# Patient Record
Sex: Female | Born: 1940 | Race: White | Hispanic: No | Smoking: Never smoker
Health system: Southern US, Community
[De-identification: ages and names within clinical notes are randomized; demographics above are authoritative.]

## PROBLEM LIST (undated history)

## (undated) DIAGNOSIS — C189 Malignant neoplasm of colon, unspecified: Secondary | ICD-10-CM

## (undated) DIAGNOSIS — J329 Chronic sinusitis, unspecified: Secondary | ICD-10-CM

## (undated) DIAGNOSIS — E063 Autoimmune thyroiditis: Secondary | ICD-10-CM

## (undated) DIAGNOSIS — Y92009 Unspecified place in unspecified non-institutional (private) residence as the place of occurrence of the external cause: Secondary | ICD-10-CM

## (undated) DIAGNOSIS — F329 Major depressive disorder, single episode, unspecified: Secondary | ICD-10-CM

## (undated) DIAGNOSIS — E785 Hyperlipidemia, unspecified: Secondary | ICD-10-CM

## (undated) DIAGNOSIS — M8580 Other specified disorders of bone density and structure, unspecified site: Secondary | ICD-10-CM

## (undated) DIAGNOSIS — G8929 Other chronic pain: Secondary | ICD-10-CM

## (undated) DIAGNOSIS — F32A Depression, unspecified: Secondary | ICD-10-CM

## (undated) DIAGNOSIS — M25511 Pain in right shoulder: Secondary | ICD-10-CM

## (undated) DIAGNOSIS — E039 Hypothyroidism, unspecified: Secondary | ICD-10-CM

## (undated) DIAGNOSIS — M1990 Unspecified osteoarthritis, unspecified site: Secondary | ICD-10-CM

## (undated) DIAGNOSIS — W19XXXA Unspecified fall, initial encounter: Secondary | ICD-10-CM

## (undated) DIAGNOSIS — F419 Anxiety disorder, unspecified: Secondary | ICD-10-CM

## (undated) DIAGNOSIS — R609 Edema, unspecified: Secondary | ICD-10-CM

## (undated) HISTORY — DX: Hypothyroidism, unspecified: E03.9

## (undated) HISTORY — DX: Other specified disorders of bone density and structure, unspecified site: M85.80

## (undated) HISTORY — PX: CATARACT EXTRACTION W/ INTRAOCULAR LENS  IMPLANT, BILATERAL: SHX1307

## (undated) HISTORY — DX: Chronic sinusitis, unspecified: J32.9

## (undated) HISTORY — PX: TONSILLECTOMY: SUR1361

## (undated) HISTORY — PX: TUBAL LIGATION: SHX77

## (undated) HISTORY — DX: Hyperlipidemia, unspecified: E78.5

## (undated) HISTORY — DX: Edema, unspecified: R60.9

---

## 2010-11-03 ENCOUNTER — Other Ambulatory Visit: Payer: Self-pay | Admitting: Geriatric Medicine

## 2010-11-03 DIAGNOSIS — Z1231 Encounter for screening mammogram for malignant neoplasm of breast: Secondary | ICD-10-CM

## 2010-11-26 ENCOUNTER — Ambulatory Visit: Payer: Self-pay

## 2011-01-14 ENCOUNTER — Ambulatory Visit
Admission: RE | Admit: 2011-01-14 | Discharge: 2011-01-14 | Disposition: A | Discharge status: Home / Self Care | Payer: BLUE CROSS/BLUE SHIELD | Source: Ambulatory Visit | Attending: Geriatric Medicine | Admitting: Geriatric Medicine

## 2011-01-14 DIAGNOSIS — Z1231 Encounter for screening mammogram for malignant neoplasm of breast: Secondary | ICD-10-CM

## 2011-11-18 ENCOUNTER — Other Ambulatory Visit: Payer: Self-pay | Admitting: Geriatric Medicine

## 2011-11-18 DIAGNOSIS — E039 Hypothyroidism, unspecified: Secondary | ICD-10-CM

## 2011-11-30 ENCOUNTER — Ambulatory Visit
Admission: RE | Admit: 2011-11-30 | Discharge: 2011-11-30 | Disposition: A | Discharge status: Home / Self Care | Payer: Medicare Other | Source: Ambulatory Visit | Attending: Geriatric Medicine | Admitting: Geriatric Medicine

## 2011-11-30 DIAGNOSIS — E039 Hypothyroidism, unspecified: Secondary | ICD-10-CM

## 2011-12-02 ENCOUNTER — Other Ambulatory Visit: Payer: Self-pay | Admitting: Geriatric Medicine

## 2011-12-02 DIAGNOSIS — E041 Nontoxic single thyroid nodule: Secondary | ICD-10-CM

## 2011-12-06 ENCOUNTER — Other Ambulatory Visit: Payer: Self-pay | Admitting: Geriatric Medicine

## 2011-12-06 DIAGNOSIS — E042 Nontoxic multinodular goiter: Secondary | ICD-10-CM

## 2011-12-07 ENCOUNTER — Other Ambulatory Visit (HOSPITAL_COMMUNITY)
Admission: RE | Admit: 2011-12-07 | Discharge: 2011-12-07 | Disposition: A | Discharge status: Home / Self Care | Payer: Medicare Other | Source: Ambulatory Visit | Attending: Interventional Radiology | Admitting: Interventional Radiology

## 2011-12-07 ENCOUNTER — Ambulatory Visit
Admission: RE | Admit: 2011-12-07 | Discharge: 2011-12-07 | Disposition: A | Discharge status: Home / Self Care | Payer: Medicare Other | Source: Ambulatory Visit | Attending: Geriatric Medicine | Admitting: Geriatric Medicine

## 2011-12-07 DIAGNOSIS — E042 Nontoxic multinodular goiter: Secondary | ICD-10-CM

## 2011-12-07 DIAGNOSIS — E041 Nontoxic single thyroid nodule: Secondary | ICD-10-CM

## 2011-12-07 DIAGNOSIS — E049 Nontoxic goiter, unspecified: Secondary | ICD-10-CM | POA: Insufficient documentation

## 2012-04-13 ENCOUNTER — Other Ambulatory Visit: Payer: Self-pay | Admitting: Geriatric Medicine

## 2012-04-13 DIAGNOSIS — R479 Unspecified speech disturbances: Secondary | ICD-10-CM

## 2012-04-13 DIAGNOSIS — R269 Unspecified abnormalities of gait and mobility: Secondary | ICD-10-CM

## 2012-04-18 ENCOUNTER — Ambulatory Visit
Admission: RE | Admit: 2012-04-18 | Discharge: 2012-04-18 | Disposition: A | Discharge status: Home / Self Care | Payer: Medicare Other | Source: Ambulatory Visit | Attending: Geriatric Medicine | Admitting: Geriatric Medicine

## 2012-04-18 DIAGNOSIS — R269 Unspecified abnormalities of gait and mobility: Secondary | ICD-10-CM

## 2012-04-18 DIAGNOSIS — R479 Unspecified speech disturbances: Secondary | ICD-10-CM

## 2012-04-18 MED ORDER — GADOBENATE DIMEGLUMINE 529 MG/ML IV SOLN
13.0000 mL | Freq: Once | INTRAVENOUS | Status: AC | PRN
  Administered 2012-04-18: 13 mL via INTRAVENOUS

## 2012-04-27 ENCOUNTER — Ambulatory Visit: Payer: Medicare Other | Attending: Geriatric Medicine | Admitting: Physical Therapy

## 2012-04-27 DIAGNOSIS — IMO0001 Reserved for inherently not codable concepts without codable children: Secondary | ICD-10-CM | POA: Insufficient documentation

## 2012-04-27 DIAGNOSIS — M6281 Muscle weakness (generalized): Secondary | ICD-10-CM | POA: Insufficient documentation

## 2012-04-27 DIAGNOSIS — R269 Unspecified abnormalities of gait and mobility: Secondary | ICD-10-CM | POA: Insufficient documentation

## 2012-05-11 ENCOUNTER — Ambulatory Visit (INDEPENDENT_AMBULATORY_CARE_PROVIDER_SITE_OTHER): Payer: Medicare Other | Admitting: Neurology

## 2012-05-11 ENCOUNTER — Encounter: Payer: Self-pay | Admitting: Neurology

## 2012-05-11 VITALS — BP 116/78 | HR 76 | Temp 97.7°F | Resp 16 | Wt 136.0 lb

## 2012-05-11 DIAGNOSIS — R531 Weakness: Secondary | ICD-10-CM | POA: Insufficient documentation

## 2012-05-11 DIAGNOSIS — R5381 Other malaise: Secondary | ICD-10-CM

## 2012-05-11 DIAGNOSIS — R269 Unspecified abnormalities of gait and mobility: Secondary | ICD-10-CM

## 2012-05-11 NOTE — Progress Notes (Signed)
Mackenzie Blanchard was seen today in the movement disorders clinic for neurologic consultation at the request of Ginette Otto, MD.  This patient is accompanied in the office by her daughter, who supplements the history.     The consultation is for the evaluation of abnormal gait and speech.  She states that it started 4 years ago.  She states that she got up one morning and felt very weak in the legs bilaterally.  She went to multiple drs in [place] without identified etiology.  She moved to [place] about 3 years ago to be near her daughter.  Her daughter noted that the weakness seemed stable over the last 3 years, but then in August 2013 something new happened.  She was in a department store when she got diffusely weak and had CP.  She went to the cardiologist and had a full cardiac w/u that was negative.  She started to have a "jerkiness" of speech that began last week.  Intermittently, her right foot will drag.   Her daughter notes that sx's are worse with stress and her daughter thinks that she sounds like she is hyperventilating.  The patient states that she does not feel like she is hyperventilating but that it is "hard" to get out her speech.  Specific Symptoms:  Tremor: intermittently in the head, but describes it as a "twitch" as opposed to a rhythmic tremor Voice: ? Softer last week but better this week Sleep: poor sleeper since divorce years ago, trazadone helps  Vivid Dreams:  no  Acting out dreams:  no Wet Pillows: no Postural symptoms:  Trouble with balance for 4 years  Falls?  Feb 2013 was last fall  Loss of smell:  no Loss of taste:  yes Urinary Incontinence:  Rare and if so due to urgency Difficulty Swallowing:  no Handwriting, micrographia: no micrographia but trouble with making handwriting "flow" Trouble with ADL's:  Is very slow and takes longer to do ADL's but does own.  Trouble buttoning clothing:  no Depression:  Denies, stating that her symptoms "worry" her but doesn't think  depressed Memory changes:  "slower to think" but able to do well. Diplopia:  no  The patient went to PT 3 weeks ago, gave her a walker and recommended that she see a neurologist.  Does not exercise.  Neuroimaging has  previously been performed.  It is available for my review today.  Her MRI was done on 04/13/12 and it was normal.  PREVIOUS MEDICATIONS: none to date for this  ALLERGIES:   Allergies  Allergen Reactions  . Codeine   . Penicillins     CURRENT MEDICATIONS:  Current Outpatient Prescriptions on File Prior to Visit  Medication Sig Dispense Refill  . diphenhydrAMINE (SOMINEX) 25 MG tablet Take 25 mg by mouth at bedtime as needed.      . hydrochlorothiazide (HYDRODIURIL) 25 MG tablet Take 25 mg by mouth daily.      Marland Kitchen levothyroxine (SYNTHROID, LEVOTHROID) 75 MCG tablet Take 75 mcg by mouth daily.      . Potassium 75 MG TABS Take by mouth. Taking around 2000mg  daily      . traZODone (DESYREL) 50 MG tablet Take 50 mg by mouth at bedtime.        PAST MEDICAL HISTORY:   Past Medical History  Diagnosis Date  . Hypothyroidism   . Edema     idiopathic  . Sinusitis, chronic     PAST SURGICAL HISTORY:   Past Surgical History  Procedure  Date  . Cataract extraction     bilaterally    SOCIAL HISTORY:   History   Social History  . Marital Status: Single    Spouse Name: N/A    Number of Children: N/A  . Years of Education: N/A   Occupational History  . Not on file.   Social History Main Topics  . Smoking status: Never Smoker   . Smokeless tobacco: Never Used  . Alcohol Use: No  . Drug Use: No  . Sexually Active: Not on file   Other Topics Concern  . Not on file   Social History Narrative  . No narrative on file    FAMILY HISTORY:   Family Status  Relation Status Death Age  . Mother Deceased     CA, lung  . Father Deceased     MI, CVA  . Brother Deceased     CA, biliary duct  . Child Alive     4, alive and well    ROS:  Denies SOB.    Some CP  but eval negative for cardiac etiology.  Has stopped all activites since august because "cannot do anything."  A complete 10 system review of systems was obtained and was unremarkable apart from what is mentioned above.  PHYSICAL EXAMINATION:    VITALS:   Filed Vitals:   05/11/12 0823  BP: 116/78  Pulse: 76  Temp: 97.7 F (36.5 C)  Resp: 16  Weight: 136 lb (61.689 kg)    GEN:  The patient appears stated age and is in NAD. HEENT:  Normocephalic, atraumatic.  The mucous membranes are moist. The superficial temporal arteries are without ropiness or tenderness. CV:  RRR Lungs:  CTAB Neck/HEME:  There are no carotid bruits bilaterally. Exts:  There is no LE edema bilaterally.   Neurological examination:  Orientation: The patient is alert and oriented x3. Fund of knowledge is appropriate.  Recent and remote memory are intact.  Attention and concentration are normal.    Able to name objects and repeat phrases. Cranial nerves: There is good facial symmetry. Pupils are equal round and reactive to light bilaterally. There are no squar wave jerks.  Fundoscopic exam reveals clear margins bilaterally. Extraocular muscles are intact. The visual fields are full to confrontational testing. The speech is variable, inconsistent and somewhat nonphysiologic.  At times she is quite fluent and clear with normal speech pattern, and that will abruptly change to a jerking, stuttering speech. Soft palate rises symmetrically and there is no tongue deviation.  There are no tongue fasciculations.  Hearing is intact to conversational tone. Sensation: Sensation is intact to light and pinprick throughout (facial, trunk, extremities). Vibration is intact at the bilateral big toe. There is no extinction with double simultaneous stimulation. There is no sensory dermatomal level identified. Motor: With encouragement, strength is 5/5 in the bilateral upper and lower extremities.  There is give way weakness. Shoulder shrug is  equal and symmetric.  There is no pronator drift.  There are no fasciculation anywhere:  Arms, legs, tongue, trunk. Deep tendon reflexes: Deep tendon reflexes are 2/4 at the bilateral biceps, triceps, brachioradialis, and achilles. Plantar responses are downgoing bilaterally.  Movement examination: Tone: There is normal tone in the bilateral upper extremities.  The tone in the lower extremities is normal.  Abnormal movements: Upon standing only (with exertion), the patient develops strange movements in the head, neck and arm that quickly abate with distraction. Coordination:  There is no decremation with RAM's, but she  is slow to perform tasks. Gait and Station: The patient is able to arise out of a deep-seated chair without the use of the hands. The patients gait has an astasia abasia quality and is variable.  When first attempting ambulation, she would kick the legs out in front of her, much like a soldier would walk.  She was then asked to walk without looking at the feet, and the ambulation became more physiologic but she groaned in pain.  Stride length was normal.  Labs: I received a copy of lab work from her primary care office dated 03/13/2012.  Her sodium was 133, potassium 4.0, AST 20, ALT 17, blood cells 6.5, hemoglobin 13.2, hematocrit 40.4 and platelets 265.  ASSESSMENT:   1.  Gait disorder and speech change.  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  I see no evidence of a neurodegenerative movement d/o.  Her brain MRI was normal.   PLAN:   1.  I discussed with the patient and her daughter that I thought we should do an EMG just to be safe and make sure that there is not a peripheral, neuromuscular problem that I have not identified. She is to call me afterwards for the results. 2.  I suggested physical and speech therapy, but the patient felt confident that these modalities would not help.  These have been shown to help in cases such as hers. 3.  I talked to her  about the role that stress could play.  I would like to see her evaluated by psychology.  She did not want [place] to make this referral but stated that she would think about it.  Her daughter was in agreement with the referral but this is obviously the patient's decision. 4.  I don't need to see the patient back in the movement d/o clinic as I feel confident that this is not a movement disorder problem or a cerebellar ataxia.  However, I do want him to call me for the results of this EMG. 5.  Time in the room with patient and daughter was 85 min.

## 2012-05-11 NOTE — Patient Instructions (Addendum)
Your appointment for the nerve conduction studies/electromyelogram is scheduled for Monday, October 21 at 9:45am. at [facility] [address] [place], [place] [phone].  Please arrive 15 minutes early.

## 2012-05-16 ENCOUNTER — Telehealth: Payer: Self-pay | Admitting: Neurology

## 2012-05-16 NOTE — Telephone Encounter (Signed)
Pt would like to be referred to [facility] for her tests instead of [facility]. She is going to cancel her [facility] appointment that is scheduled on 05/26/2012. Can we refer her to [facility] instead? Please advise pt once this has been done.

## 2012-05-17 NOTE — Telephone Encounter (Signed)
Received a call from the patient. She states she has cancelled the NCV/EMG scheduled for later this month in [facility]. She is requesting a referral to a doctor at [facility] that is capable of treating her neurological problems as well as do the testing. We discussed the testing and the referral process and she tells me she will just have her PCP make the referral for her tomorrow when she goes. I told her to let us know if we could help in any way. (see the note below from the patient's daughter.) She states she will.

## 2012-05-17 NOTE — Telephone Encounter (Signed)
Received a call from the patient's daughter, Mackenzie Blanchard. She reports that she does not feel the referral to [facility] for the nerve test is warranted and states that she would not take her for it anyway. She also reports that her mom is seeing Dr. Pete Glatter tomorrow and he also agrees that a referral to [facility] is not needed at this time. [name] states that if her mom calls me back to discuss this that I should let her know tactfully that going to [facility] is not an option at this point. She also asked that I not tell her mother that we talked. We agreed on this plan.

## 2012-05-17 NOTE — Telephone Encounter (Signed)
Left a message for the patient to return my call.  

## 2012-10-20 ENCOUNTER — Other Ambulatory Visit: Payer: Self-pay

## 2012-10-20 DIAGNOSIS — Z1231 Encounter for screening mammogram for malignant neoplasm of breast: Secondary | ICD-10-CM

## 2012-11-01 ENCOUNTER — Other Ambulatory Visit: Payer: Self-pay | Admitting: Gastroenterology

## 2012-11-02 ENCOUNTER — Encounter (HOSPITAL_COMMUNITY): Payer: Self-pay | Admitting: *Deleted

## 2012-11-16 ENCOUNTER — Ambulatory Visit (HOSPITAL_COMMUNITY): Payer: Medicare Other | Admitting: *Deleted

## 2012-11-16 ENCOUNTER — Encounter (HOSPITAL_COMMUNITY)
Admission: RE | Disposition: A | Discharge status: Home / Self Care | Payer: Self-pay | Source: Ambulatory Visit | Attending: Gastroenterology

## 2012-11-16 ENCOUNTER — Encounter (HOSPITAL_COMMUNITY): Payer: Self-pay | Admitting: *Deleted

## 2012-11-16 ENCOUNTER — Ambulatory Visit (HOSPITAL_COMMUNITY)
Admission: RE | Admit: 2012-11-16 | Discharge: 2012-11-16 | Disposition: A | Discharge status: Home / Self Care | Payer: Medicare Other | Source: Ambulatory Visit | Attending: Gastroenterology | Admitting: Gastroenterology

## 2012-11-16 DIAGNOSIS — E042 Nontoxic multinodular goiter: Secondary | ICD-10-CM | POA: Insufficient documentation

## 2012-11-16 DIAGNOSIS — J309 Allergic rhinitis, unspecified: Secondary | ICD-10-CM | POA: Insufficient documentation

## 2012-11-16 DIAGNOSIS — Z885 Allergy status to narcotic agent status: Secondary | ICD-10-CM | POA: Insufficient documentation

## 2012-11-16 DIAGNOSIS — D126 Benign neoplasm of colon, unspecified: Secondary | ICD-10-CM | POA: Insufficient documentation

## 2012-11-16 DIAGNOSIS — Z880 Allergy status to penicillin: Secondary | ICD-10-CM | POA: Insufficient documentation

## 2012-11-16 DIAGNOSIS — I872 Venous insufficiency (chronic) (peripheral): Secondary | ICD-10-CM | POA: Insufficient documentation

## 2012-11-16 DIAGNOSIS — Z1211 Encounter for screening for malignant neoplasm of colon: Secondary | ICD-10-CM | POA: Insufficient documentation

## 2012-11-16 DIAGNOSIS — K5730 Diverticulosis of large intestine without perforation or abscess without bleeding: Secondary | ICD-10-CM | POA: Insufficient documentation

## 2012-11-16 DIAGNOSIS — M899 Disorder of bone, unspecified: Secondary | ICD-10-CM | POA: Insufficient documentation

## 2012-11-16 DIAGNOSIS — Z86718 Personal history of other venous thrombosis and embolism: Secondary | ICD-10-CM | POA: Insufficient documentation

## 2012-11-16 DIAGNOSIS — E7800 Pure hypercholesterolemia, unspecified: Secondary | ICD-10-CM | POA: Insufficient documentation

## 2012-11-16 DIAGNOSIS — E039 Hypothyroidism, unspecified: Secondary | ICD-10-CM | POA: Insufficient documentation

## 2012-11-16 HISTORY — PX: COLONOSCOPY WITH PROPOFOL: SHX5780

## 2012-11-16 LAB — POCT I-STAT EG7
Calcium, Ion: 1.31 mmol/L — ABNORMAL HIGH (ref 1.13–1.30)
HCT: 42 % (ref 36.0–46.0)
Hemoglobin: 14.3 g/dL (ref 12.0–15.0)
O2 Saturation: 27 %
pCO2, Ven: 56.5 mmHg — ABNORMAL HIGH (ref 45.0–50.0)
pH, Ven: 7.334 — ABNORMAL HIGH (ref 7.250–7.300)
pO2, Ven: 20 mmHg — CL (ref 30.0–45.0)

## 2012-11-16 SURGERY — COLONOSCOPY WITH PROPOFOL
Anesthesia: Monitor Anesthesia Care

## 2012-11-16 MED ORDER — KETAMINE HCL 10 MG/ML IJ SOLN
INTRAMUSCULAR | Status: DC | PRN
  Administered 2012-11-16: 10 mg via INTRAVENOUS

## 2012-11-16 MED ORDER — FENTANYL CITRATE 0.05 MG/ML IJ SOLN
INTRAMUSCULAR | Status: DC | PRN
  Administered 2012-11-16: 100 ug via INTRAVENOUS

## 2012-11-16 MED ORDER — PROPOFOL 10 MG/ML IV EMUL
INTRAVENOUS | Status: DC | PRN
  Administered 2012-11-16: 100 ug/kg/min via INTRAVENOUS

## 2012-11-16 MED ORDER — DEXTROSE 5 % IV SOLN
INTRAVENOUS | Status: DC
  Administered 2012-11-16: 500 mL via INTRAVENOUS

## 2012-11-16 MED ORDER — SODIUM CHLORIDE 0.9 % IV SOLN: INTRAVENOUS | Status: DC

## 2012-11-16 MED ORDER — LIDOCAINE HCL 1 % IJ SOLN
INTRAMUSCULAR | Status: DC | PRN
  Administered 2012-11-16: 50 mg via INTRADERMAL

## 2012-11-16 MED ORDER — LACTATED RINGERS IV SOLN: INTRAVENOUS | Status: DC

## 2012-11-16 MED ORDER — MIDAZOLAM HCL 5 MG/5ML IJ SOLN
INTRAMUSCULAR | Status: DC | PRN
  Administered 2012-11-16: 2 mg via INTRAVENOUS

## 2012-11-16 SURGICAL SUPPLY — 21 items

## 2012-11-16 NOTE — Op Note (Signed)
Procedure: Screening colonoscopy  Endoscopist: Danise Edge  Premedication: Propofol administered by anesthesia  Procedure: The patient was placed in the left lateral decubitus position. Anal inspection and digital rectal exam were normal. The Pentax pediatric colonoscope was introduced into the rectum and advanced to the cecum. A normal-appearing appendiceal orifice and ileocecal valve were identified. Colonic preparation for the exam today was good.  Rectum. Normal. Retroflex view of the distal rectum normal.  Sigmoid colon and descending colon. Left colonic diverticulosis.  Splenic flexure. Normal.  Transverse colon. Normal.  Hepatic flexure. Normal.  Descending colon. Normal.  Cecum and ileocecal valve. In the proximal cecum adjacent to the appendiceal orifice there was a 20 mm carpet-like villous appearing  sessile polyp which was biopsied. The polyp was not removed.  Assessment:  #1. There is a 20 mm carpet-like villous-appearing sessile polyp in the proximal cecum which was biopsied but not removed.  #2. Left colonic diverticulosis  Recommendations: If cecal polyp returns neoplastic but noncancerous pathologically, I will refer her to Dr. Georges Mouse at [facility] for endoscopic mucosal resection of the proximal cecal polyp.

## 2012-11-16 NOTE — Preoperative (Signed)
Beta Blockers   Reason not to administer Beta Blockers:Not Applicable 

## 2012-11-16 NOTE — H&P (Signed)
Procedure: Screening colonoscopy  History: The patient is a 72 year old female born 1940/11/03. The patient is scheduled to undergo her first screening colonoscopy with polypectomy to prevent colon cancer.  Chronic medications: Vitamins test. Potassium. Multivitamin. Magnesium. Vitamin C. Vitamin D. Lycopene. Melatonin. Benadryl. Aspirin. Give furosemide. Synthroid. Hydrochlorothiazide. Trazodone.  Past medical and surgical history: Hypercholesterolemia. Venous insufficiency. Allergic rhinitis. 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 Hypothyroidism. Osteopenia. Multinodular goiter. Tubal ligation. Bilateral cataract surgery.  Medication allergies: Penicillin and codeine  Exam: The patient is alert and lying comfortably on the endoscopy stretcher. Sclera are nonicteric. Mouth and throat appear normal. Cardiac exam reveals a regular rhythm. Lungs are clear to auscultation.  Plan: Proceed with baseline screening colonoscopy with polypectomy to prevent colon cancer.

## 2012-11-16 NOTE — Transfer of Care (Signed)
Immediate Anesthesia Transfer of Care Note  Patient: Mackenzie Blanchard  Procedure(s) Performed: Procedure(s) with comments: COLONOSCOPY WITH PROPOFOL (N/A) - h&p in file-hope  Patient Location: PACU and Endoscopy Unit  Anesthesia Type:MAC  Level of Consciousness: oriented and patient cooperative  Airway & Oxygen Therapy: Patient Spontanous Breathing and Patient connected to face mask oxygen  Post-op Assessment: Report given to PACU RN, Post -op Vital signs reviewed and stable and Patient moving all extremities  Post vital signs: Reviewed and stable  Complications: No apparent anesthesia complications

## 2012-11-16 NOTE — Anesthesia Preprocedure Evaluation (Addendum)
Anesthesia Evaluation  Patient identified by MRN, date of birth, ID band Patient awake    Reviewed: Allergy & Precautions, H&P , NPO status , Patient's Chart, lab work & pertinent test results  Airway Mallampati: II TM Distance: >3 FB Neck ROM: Full    Dental  (+) Teeth Intact and Dental Advisory Given   Pulmonary neg pulmonary ROS,  breath sounds clear to auscultation  Pulmonary exam normal       Cardiovascular negative cardio ROS  Rhythm:Regular Rate:Normal  Idiopathic edema; Rx with HCTZ   Neuro/Psych negative neurological ROS  negative psych ROS   GI/Hepatic negative GI ROS, Neg liver ROS,   Endo/Other  Hypothyroidism   Renal/GU negative Renal ROS  negative genitourinary   Musculoskeletal negative musculoskeletal ROS (+)   Abdominal   Peds  Hematology negative hematology ROS (+)   Anesthesia Other Findings   Reproductive/Obstetrics                         Anesthesia Physical Anesthesia Plan  ASA: II  Anesthesia Plan: MAC   Post-op Pain Management:    Induction: Intravenous  Airway Management Planned: Simple Face Mask  Additional Equipment:   Intra-op Plan:   Post-operative Plan:   Informed Consent: I have reviewed the patients History and Physical, chart, labs and discussed the procedure including the risks, benefits and alternatives for the proposed anesthesia with the patient or authorized representative who has indicated his/her understanding and acceptance.   Dental advisory given  Plan Discussed with: CRNA  Anesthesia Plan Comments: (Spoke with patient at length regarding her concerns surrounding idiopathic edema. She is very concerned about receiving any form of balanced salt solution (normal saline, ringers lactate) during the course of this procedure. She insists that we use "water" only. She has consented to the use of D5W,  which we will administer at a reduced  volume. )       Anesthesia Quick Evaluation

## 2012-11-17 ENCOUNTER — Encounter (HOSPITAL_COMMUNITY): Payer: Self-pay | Admitting: Gastroenterology

## 2012-11-27 NOTE — Anesthesia Postprocedure Evaluation (Signed)
Anesthesia Post Note  Patient: Mackenzie Blanchard  Procedure(s) Performed: Procedure(s) (LRB): COLONOSCOPY WITH PROPOFOL (N/A)  Anesthesia type: MAC  Patient location: PACU  Post pain: Pain level controlled  Post assessment: Post-op Vital signs reviewed  Last Vitals:  Filed Vitals:   11/16/12 1458  BP: 126/75  Pulse:   Temp:   Resp: 15    Post vital signs: Reviewed  Level of consciousness: sedated  Complications: No apparent anesthesia complications

## 2012-12-06 ENCOUNTER — Ambulatory Visit: Payer: Medicare Other

## 2012-12-07 ENCOUNTER — Ambulatory Visit: Payer: Medicare Other

## 2012-12-21 ENCOUNTER — Ambulatory Visit
Admission: RE | Admit: 2012-12-21 | Discharge: 2012-12-21 | Disposition: A | Discharge status: Home / Self Care | Payer: Medicare Other | Source: Ambulatory Visit

## 2012-12-21 DIAGNOSIS — Z1231 Encounter for screening mammogram for malignant neoplasm of breast: Secondary | ICD-10-CM

## 2013-01-18 ENCOUNTER — Other Ambulatory Visit: Payer: Self-pay | Admitting: Geriatric Medicine

## 2013-01-18 DIAGNOSIS — E049 Nontoxic goiter, unspecified: Secondary | ICD-10-CM

## 2013-01-19 ENCOUNTER — Ambulatory Visit
Admission: RE | Admit: 2013-01-19 | Discharge: 2013-01-19 | Disposition: A | Discharge status: Home / Self Care | Payer: Medicare Other | Source: Ambulatory Visit | Attending: Geriatric Medicine | Admitting: Geriatric Medicine

## 2013-01-19 DIAGNOSIS — E049 Nontoxic goiter, unspecified: Secondary | ICD-10-CM

## 2013-05-02 HISTORY — PX: COLECTOMY: SHX59

## 2014-01-23 ENCOUNTER — Other Ambulatory Visit: Payer: Self-pay | Admitting: Geriatric Medicine

## 2014-01-23 DIAGNOSIS — E031 Congenital hypothyroidism without goiter: Secondary | ICD-10-CM

## 2014-01-28 ENCOUNTER — Ambulatory Visit
Admission: RE | Admit: 2014-01-28 | Discharge: 2014-01-28 | Disposition: A | Discharge status: Home / Self Care | Payer: Medicare Other | Source: Ambulatory Visit | Attending: Geriatric Medicine | Admitting: Geriatric Medicine

## 2014-01-28 DIAGNOSIS — E031 Congenital hypothyroidism without goiter: Secondary | ICD-10-CM

## 2014-04-17 ENCOUNTER — Other Ambulatory Visit: Payer: Self-pay

## 2014-04-17 DIAGNOSIS — Z1231 Encounter for screening mammogram for malignant neoplasm of breast: Secondary | ICD-10-CM

## 2014-05-09 ENCOUNTER — Ambulatory Visit
Admission: RE | Admit: 2014-05-09 | Discharge: 2014-05-09 | Disposition: A | Discharge status: Home / Self Care | Payer: Medicare Other | Source: Ambulatory Visit

## 2014-05-09 DIAGNOSIS — Z1231 Encounter for screening mammogram for malignant neoplasm of breast: Secondary | ICD-10-CM

## 2015-03-17 ENCOUNTER — Ambulatory Visit
Admission: RE | Admit: 2015-03-17 | Discharge: 2015-03-17 | Disposition: A | Discharge status: Home / Self Care | Payer: Medicare Other | Source: Ambulatory Visit | Attending: Nurse Practitioner | Admitting: Nurse Practitioner

## 2015-03-17 ENCOUNTER — Other Ambulatory Visit: Payer: Self-pay | Admitting: Nurse Practitioner

## 2015-03-17 DIAGNOSIS — M542 Cervicalgia: Secondary | ICD-10-CM

## 2015-03-22 ENCOUNTER — Encounter (HOSPITAL_COMMUNITY): Payer: Self-pay | Admitting: Emergency Medicine

## 2015-03-22 ENCOUNTER — Emergency Department (HOSPITAL_COMMUNITY): Payer: Medicare Other

## 2015-03-22 ENCOUNTER — Inpatient Hospital Stay (HOSPITAL_COMMUNITY)
Admission: EM | Admit: 2015-03-22 | Discharge: 2015-03-25 | DRG: 689 | Disposition: A | Discharge status: Home Health | Payer: Medicare Other | Attending: Internal Medicine | Admitting: Internal Medicine

## 2015-03-22 DIAGNOSIS — Z859 Personal history of malignant neoplasm, unspecified: Secondary | ICD-10-CM

## 2015-03-22 DIAGNOSIS — G9340 Encephalopathy, unspecified: Secondary | ICD-10-CM | POA: Diagnosis present

## 2015-03-22 DIAGNOSIS — N390 Urinary tract infection, site not specified: Secondary | ICD-10-CM | POA: Diagnosis present

## 2015-03-22 DIAGNOSIS — E861 Hypovolemia: Secondary | ICD-10-CM | POA: Diagnosis present

## 2015-03-22 DIAGNOSIS — G4700 Insomnia, unspecified: Secondary | ICD-10-CM | POA: Diagnosis present

## 2015-03-22 DIAGNOSIS — M542 Cervicalgia: Secondary | ICD-10-CM | POA: Diagnosis present

## 2015-03-22 DIAGNOSIS — E039 Hypothyroidism, unspecified: Secondary | ICD-10-CM | POA: Diagnosis present

## 2015-03-22 DIAGNOSIS — R531 Weakness: Secondary | ICD-10-CM | POA: Diagnosis present

## 2015-03-22 DIAGNOSIS — E785 Hyperlipidemia, unspecified: Secondary | ICD-10-CM | POA: Diagnosis present

## 2015-03-22 DIAGNOSIS — Z79891 Long term (current) use of opiate analgesic: Secondary | ICD-10-CM

## 2015-03-22 DIAGNOSIS — M8580 Other specified disorders of bone density and structure, unspecified site: Secondary | ICD-10-CM | POA: Diagnosis present

## 2015-03-22 DIAGNOSIS — Z880 Allergy status to penicillin: Secondary | ICD-10-CM

## 2015-03-22 DIAGNOSIS — M62838 Other muscle spasm: Secondary | ICD-10-CM | POA: Diagnosis present

## 2015-03-22 DIAGNOSIS — Z7982 Long term (current) use of aspirin: Secondary | ICD-10-CM

## 2015-03-22 DIAGNOSIS — Z79899 Other long term (current) drug therapy: Secondary | ICD-10-CM | POA: Diagnosis not present

## 2015-03-22 DIAGNOSIS — F419 Anxiety disorder, unspecified: Secondary | ICD-10-CM

## 2015-03-22 DIAGNOSIS — M6248 Contracture of muscle, other site: Secondary | ICD-10-CM | POA: Diagnosis not present

## 2015-03-22 DIAGNOSIS — Z885 Allergy status to narcotic agent status: Secondary | ICD-10-CM

## 2015-03-22 DIAGNOSIS — R627 Adult failure to thrive: Secondary | ICD-10-CM | POA: Diagnosis present

## 2015-03-22 DIAGNOSIS — E871 Hypo-osmolality and hyponatremia: Secondary | ICD-10-CM | POA: Diagnosis present

## 2015-03-22 DIAGNOSIS — M25519 Pain in unspecified shoulder: Secondary | ICD-10-CM | POA: Diagnosis present

## 2015-03-22 LAB — TSH: TSH: 4.521 u[IU]/mL — AB (ref 0.350–4.500)

## 2015-03-22 LAB — CBC
HCT: 44 % (ref 36.0–46.0)
Hemoglobin: 15.1 g/dL — ABNORMAL HIGH (ref 12.0–15.0)
MCH: 31.2 pg (ref 26.0–34.0)
MCHC: 34.3 g/dL (ref 30.0–36.0)
MCV: 90.9 fL (ref 78.0–100.0)
Platelets: 268 10*3/uL (ref 150–400)
RBC: 4.84 MIL/uL (ref 3.87–5.11)
RDW: 12.6 % (ref 11.5–15.5)
WBC: 5.6 10*3/uL (ref 4.0–10.5)

## 2015-03-22 LAB — URINALYSIS, ROUTINE W REFLEX MICROSCOPIC
BILIRUBIN URINE: NEGATIVE
GLUCOSE, UA: NEGATIVE mg/dL
Ketones, ur: NEGATIVE mg/dL
Nitrite: POSITIVE — AB
PROTEIN: NEGATIVE mg/dL
Specific Gravity, Urine: 1.008 (ref 1.005–1.030)
UROBILINOGEN UA: 0.2 mg/dL (ref 0.0–1.0)
pH: 7 (ref 5.0–8.0)

## 2015-03-22 LAB — BASIC METABOLIC PANEL
Anion gap: 10 (ref 5–15)
BUN: 15 mg/dL (ref 6–20)
CALCIUM: 9.6 mg/dL (ref 8.9–10.3)
CO2: 27 mmol/L (ref 22–32)
CREATININE: 0.83 mg/dL (ref 0.44–1.00)
Chloride: 96 mmol/L — ABNORMAL LOW (ref 101–111)
GLUCOSE: 113 mg/dL — AB (ref 65–99)
POTASSIUM: 4 mmol/L (ref 3.5–5.1)
SODIUM: 133 mmol/L — AB (ref 135–145)

## 2015-03-22 LAB — URINE MICROSCOPIC-ADD ON

## 2015-03-22 MED ORDER — HEPARIN SODIUM (PORCINE) 5000 UNIT/ML IJ SOLN
5000.0000 [IU] | Freq: Three times a day (TID) | INTRAMUSCULAR | Status: DC
  Administered 2015-03-22 – 2015-03-25 (×8): 5000 [IU] via SUBCUTANEOUS
  Filled 2015-03-22 (×8): qty 1

## 2015-03-22 MED ORDER — DEXTROSE 5 % IV SOLN
1.0000 g | Freq: Once | INTRAVENOUS | Status: AC
  Administered 2015-03-22: 1 g via INTRAVENOUS
  Filled 2015-03-22: qty 10

## 2015-03-22 MED ORDER — DEXAMETHASONE SODIUM PHOSPHATE 10 MG/ML IJ SOLN
10.0000 mg | Freq: Once | INTRAMUSCULAR | Status: AC
  Administered 2015-03-22: 10 mg via INTRAVENOUS
  Filled 2015-03-22: qty 1

## 2015-03-22 MED ORDER — POLYETHYLENE GLYCOL 3350 17 G PO PACK: 17.0000 g | PACK | Freq: Every day | ORAL | Status: DC | PRN

## 2015-03-22 MED ORDER — DEXTROSE 5 % IV SOLN
1.0000 g | INTRAVENOUS | Status: DC
  Administered 2015-03-23 – 2015-03-24 (×2): 1 g via INTRAVENOUS
  Filled 2015-03-22 (×3): qty 10

## 2015-03-22 MED ORDER — TRAZODONE HCL 50 MG PO TABS
50.0000 mg | ORAL_TABLET | Freq: Every day | ORAL | Status: DC
  Administered 2015-03-23 – 2015-03-24 (×2): 50 mg via ORAL
  Filled 2015-03-22 (×2): qty 1

## 2015-03-22 MED ORDER — ASPIRIN EC 81 MG PO TBEC
81.0000 mg | DELAYED_RELEASE_TABLET | Freq: Every day | ORAL | Status: DC
Start: 2015-03-22 — End: 2015-03-25
  Administered 2015-03-23 – 2015-03-25 (×3): 81 mg via ORAL
  Filled 2015-03-22 (×3): qty 1

## 2015-03-22 MED ORDER — PROCHLORPERAZINE EDISYLATE 5 MG/ML IJ SOLN
10.0000 mg | Freq: Once | INTRAMUSCULAR | Status: AC
  Administered 2015-03-22: 10 mg via INTRAVENOUS
  Filled 2015-03-22: qty 2

## 2015-03-22 MED ORDER — SODIUM CHLORIDE 0.9 % IV BOLUS (SEPSIS)
1000.0000 mL | Freq: Once | INTRAVENOUS | Status: AC
  Administered 2015-03-22: 1000 mL via INTRAVENOUS

## 2015-03-22 MED ORDER — LORAZEPAM 1 MG PO TABS
0.5000 mg | ORAL_TABLET | Freq: Once | ORAL | Status: AC
  Administered 2015-03-22: 0.5 mg via ORAL
  Filled 2015-03-22: qty 1

## 2015-03-22 MED ORDER — ACETAMINOPHEN 325 MG PO TABS
650.0000 mg | ORAL_TABLET | Freq: Four times a day (QID) | ORAL | Status: DC | PRN
  Administered 2015-03-23: 650 mg via ORAL
  Filled 2015-03-22: qty 2

## 2015-03-22 MED ORDER — DIPHENHYDRAMINE HCL 50 MG/ML IJ SOLN
12.5000 mg | Freq: Once | INTRAMUSCULAR | Status: AC
  Administered 2015-03-22: 12.5 mg via INTRAVENOUS
  Filled 2015-03-22: qty 1

## 2015-03-22 MED ORDER — ASPIRIN 81 MG PO TABS: 81.0000 mg | ORAL_TABLET | Freq: Every day | ORAL | Status: DC

## 2015-03-22 MED ORDER — HYDROMORPHONE HCL 2 MG PO TABS: 2.0000 mg | ORAL_TABLET | ORAL | Status: DC | PRN

## 2015-03-22 MED ORDER — ACETAMINOPHEN 325 MG PO TABS
650.0000 mg | ORAL_TABLET | Freq: Once | ORAL | Status: AC
  Administered 2015-03-22: 650 mg via ORAL
  Filled 2015-03-22: qty 2

## 2015-03-22 MED ORDER — HALOPERIDOL LACTATE 5 MG/ML IJ SOLN: 2.0000 mg | Freq: Four times a day (QID) | INTRAMUSCULAR | Status: DC | PRN

## 2015-03-22 MED ORDER — IOHEXOL 350 MG/ML SOLN
50.0000 mL | Freq: Once | INTRAVENOUS | Status: AC | PRN
  Administered 2015-03-22: 50 mL via INTRAVENOUS

## 2015-03-22 MED ORDER — LEVOTHYROXINE SODIUM 50 MCG PO TABS
75.0000 ug | ORAL_TABLET | Freq: Every day | ORAL | Status: DC
  Administered 2015-03-23 – 2015-03-25 (×3): 75 ug via ORAL
  Filled 2015-03-22 (×7): qty 1

## 2015-03-22 MED ORDER — SODIUM CHLORIDE 0.9 % IV SOLN
INTRAVENOUS | Status: DC
  Administered 2015-03-22 – 2015-03-24 (×2): via INTRAVENOUS

## 2015-03-22 NOTE — ED Notes (Signed)
Pt requesting water to drink, due to CT results not being complete pt given small amount of water and green sponge OK per RN Narda Rutherford

## 2015-03-22 NOTE — ED Notes (Signed)
Pts daughter states, "My mom has some untreated psych issues. She has bipolar & anxiety. She is okay with hearing the anxiety language put the moment you mention delusions or bipolar she feels threatened. I know she is hurting but this situation is growing. She had something similar with increased health concerns about three years ago."

## 2015-03-22 NOTE — ED Notes (Signed)
Onset last week patient had a CT of abdomen. During CT instructed to raise arms above head and turned head away from right shoulder. Prior to CT did not have pain. After CT developed pain right shoulder radiating to right side of neck, nausea, general weakness. Family present states possible anxiety.

## 2015-03-22 NOTE — H&P (Signed)
[facility] History and Physical  Mackenzie Blanchard BOF:[id number] DOB: February 23, 1941 DOA: 03/22/2015  Referring physician: Dr. Joycelyn Rua PCP: Mathews Argyle, MD   Chief Complaint: confusion  HPI: Mackenzie Blanchard is a 74 y.o. female with past medical history according to her daughter of undiagnosed psychiatric disorders presents to the ED for confusion and shoulder neck and head pain. The daughter relates that she had a surveillance CT scan a couple weeks ago after living in her stomach she started complaining of shoulder and neck pain that has been progressively getting worse. Patient relates she has been complaining of the above symptoms that have aggressively gotten worse. She denies any fever cough chest pain or shortness of breath the patient daughter relates that she has severe anxiety for which she takes a next. The patient has also been complaining of dizziness upon standing, more like headache and some vomiting but nonbloody. She denies any diarrhea. She is complaining of generalized weakness no vision changes.  In the ED: Multiple imaging were done as below a CBC was done that showed no leukocytosis, a UA was done that shows possible urinary tract infections we're consulted for further evaluation.   Review of Systems:  Constitutional:  No weight loss, night sweats, Fevers, chills, fatigue.  HEENT:  No headaches, Difficulty swallowing,Tooth/dental problems,Sore throat,  No sneezing, itching, ear ache, nasal congestion, post nasal drip,  Cardio-vascular:  No chest pain, Orthopnea, PND, swelling in lower extremities, anasarca, dizziness, palpitations  GI:  No heartburn, indigestion, abdominal pain, nausea, vomiting, diarrhea, change in bowel habits, loss of appetite  Resp:  No shortness of breath with exertion or at rest. No excess mucus, no productive cough, No non-productive cough, No coughing up of blood.No change in color of mucus.No wheezing.No chest wall deformity  Skin:    no rash or lesions.  GU:  no dysuria, change in color of urine, no urgency or frequency. No flank pain.  Musculoskeletal:  No joint pain or swelling. No decreased range of motion. No back pain.  Psych:  No change in mood or affect. No depression or anxiety. No memory loss.   Past Medical History  Diagnosis Date  . Hypothyroidism   . Edema     idiopathic  . Sinusitis, chronic   . Hyperlipidemia   . Osteopenia   . Cancer    Past Surgical History  Procedure Laterality Date  . Cataract extraction      bilaterally  . Tubal ligation    . Colonoscopy with propofol N/A 11/16/2012    Procedure: COLONOSCOPY WITH PROPOFOL;  Surgeon: Garlan Fair, MD;  Location: [facility];  Service: Endoscopy;  Laterality: N/A;  h&p in file-hope  . Colectomy  05/2013   Social History:  reports that she has never smoked. She has never used smokeless tobacco. She reports that she does not drink alcohol or use illicit drugs.  Allergies  Allergen Reactions  . Codeine   . Penicillins     Family History  Problem Relation Age of Onset  . Lung cancer Mother   . Hypertension Father   . Heart disease Father   . Cancer - Other Brother   . Diabetes Brother     Prior to Admission medications   Medication Sig Start Date End Date Taking? Authorizing Provider  acetaminophen (TYLENOL) 325 MG tablet Take 650 mg by mouth every 6 (six) hours as needed for mild pain or moderate pain.   Yes Historical Provider, MD  ALPRAZolam Duanne Moron) 0.5 MG tablet Take 0.5  mg by mouth 2 (two) times daily as needed for anxiety.   Yes Historical Provider, MD  aspirin 81 MG tablet Take 81 mg by mouth daily.   Yes Historical Provider, MD  Cholecalciferol (VITAMIN D) 2000 UNITS CAPS Take 1 capsule by mouth daily.    Yes Historical Provider, MD  Coenzyme Q10 (CO Q10) 100 MG CAPS Take 2 capsules by mouth daily.   Yes Historical Provider, MD  Coenzyme Q10-Fish Oil-Vit E (CO-Q 10 OMEGA-3 FISH OIL PO) Take 200 mg by mouth daily.   Yes  Historical Provider, MD  diphenhydrAMINE (SOMINEX) 25 MG tablet Take 25 mg by mouth at bedtime as needed.   Yes Historical Provider, MD  furosemide (LASIX) 20 MG tablet Take 20 mg by mouth daily as needed for fluid.    Yes Historical Provider, MD  HYDROmorphone (DILAUDID) 2 MG tablet Take 2 mg by mouth every 4 (four) hours as needed for moderate pain or severe pain.   Yes Historical Provider, MD  levothyroxine (SYNTHROID, LEVOTHROID) 75 MCG tablet Take 75 mcg by mouth daily.   Yes Historical Provider, MD  Lutein 20 MG CAPS Take 1 capsule by mouth daily.   Yes Historical Provider, MD  Lycopene 10 MG CAPS Take 1 capsule by mouth daily.   Yes Historical Provider, MD  Magnesium 400 MG CAPS Take 1 tablet by mouth 3 (three) times daily. 1200mg  daily   Yes Historical Provider, MD  magnesium aspartate (MAGINEX) 615 MG tablet Take 615 mg by mouth daily.   Yes Historical Provider, MD  Melatonin 5 MG CAPS Take 1 capsule by mouth at bedtime.    Yes Historical Provider, MD  Multiple Vitamin (MULTIVITAMIN) tablet Take 1 tablet by mouth daily.   Yes Historical Provider, MD  Potassium 75 MG TABS Take by mouth. Taking around 2000mg  daily   Yes Historical Provider, MD  traMADol (ULTRAM) 50 MG tablet Take 50 mg by mouth every 6 (six) hours as needed for moderate pain.   Yes Historical Provider, MD  traZODone (DESYREL) 50 MG tablet Take 50 mg by mouth at bedtime.   Yes Historical Provider, MD  hydrochlorothiazide (HYDRODIURIL) 25 MG tablet Take 25 mg by mouth daily.    Historical Provider, MD   Physical Exam: Filed Vitals:   03/22/15 1731 03/22/15 1745 03/22/15 1800 03/22/15 1815  BP: 127/71 121/74 117/51 143/75  Pulse: 71 82 76 81  Temp:      TempSrc:      Resp: 18     Height:      Weight:      SpO2: 98% 96% 93% 98%    Wt Readings from Last 3 Encounters:  03/22/15 61.236 kg (135 lb)  11/16/12 63.504 kg (140 lb)  05/11/12 61.689 kg (136 lb)    General:  Appears calm and comfortable, appears  sleepy Eyes: PERRL, normal lids, irises & conjunctiva ENT: Dry mucous membranes Neck: no LAD, masses or thyromegaly Cardiovascular: RRR, no m/r/g. No LE edema. Telemetry: SR, no arrhythmias  Respiratory: CTA bilaterally, no w/r/r. Normal respiratory effort. Abdomen: soft, mild suprapubic tenderness. Skin: no rash or induration seen on limited exam Musculoskeletal: grossly normal tone BUE/BLE Psychiatric: grossly normal mood and affect, speech fluent and appropriate Neurologic: grossly non-focal.          Labs on Admission:  Basic Metabolic Panel:  Recent Labs Lab 03/22/15 1357  NA 133*  K 4.0  CL 96*  CO2 27  GLUCOSE 113*  BUN 15  CREATININE 0.83  CALCIUM 9.6  Liver Function Tests: No results for input(s): AST, ALT, ALKPHOS, BILITOT, PROT, ALBUMIN in the last 168 hours. No results for input(s): LIPASE, AMYLASE in the last 168 hours. No results for input(s): AMMONIA in the last 168 hours. CBC:  Recent Labs Lab 03/22/15 1357  WBC 5.6  HGB 15.1*  HCT 44.0  MCV 90.9  PLT 268   Cardiac Enzymes: No results for input(s): CKTOTAL, CKMB, CKMBINDEX, TROPONINI in the last 168 hours.  BNP (last 3 results) No results for input(s): BNP in the last 8760 hours.  ProBNP (last 3 results) No results for input(s): PROBNP in the last 8760 hours.  CBG: No results for input(s): GLUCAP in the last 168 hours.  Radiological Exams on Admission: Ct Angio Head W/cm &/or Wo Cm  03/22/2015   CLINICAL DATA:  Right neck pain radiating to the shoulder region. Acute onset.  EXAM: CT ANGIOGRAPHY HEAD AND NECK  TECHNIQUE: Multidetector CT imaging of the head and neck was performed using the standard protocol during bolus administration of intravenous contrast. Multiplanar CT image reconstructions and MIPs were obtained to evaluate the vascular anatomy. Carotid stenosis measurements (when applicable) are obtained utilizing NASCET criteria, using the distal internal carotid diameter as the  denominator.  CONTRAST:  60mL OMNIPAQUE IOHEXOL 350 MG/ML SOLN  COMPARISON:  MRI head 04/18/2012.  FINDINGS: CT HEAD  The brain shows mild age related atrophy. No evidence of old or acute focal infarction, mass lesion, hemorrhage, hydrocephalus or extra-axial collection. Sinuses, middle ears and mastoids are clear. There is atherosclerotic calcification of the major vessels at the base of the brain.  CTA NECK  Aortic arch: Atherosclerosis of the aorta but no evidence of dissection. Branching pattern of the brachiocephalic vessels from the arch is normal. No origin stenoses.  Right carotid system: Common carotid artery is widely patent to the bifurcation region. Minimal atherosclerotic change at the bifurcation but no stenosis or significant irregularity. Right cervical internal carotid artery is tortuous but otherwise normal.  Left carotid system: Common carotid artery is widely patent to the bifurcation. The bifurcation is normal. The left cervical internal carotid artery is tortuous but widely patent.  Vertebral arteries:No proximal subclavian stenosis. Both vertebral artery origins are widely patent. The left vertebral artery is dominant. Both vertebral arteries are widely patent through the cervical region.  Skeleton: Cervical facet arthritis which could be a cause of neck pain. Mild cervical spondylosis at C6-7 without significant narrowing of the canal or foramina.  Other neck: Thyroid goiter, more extensive on the left than the right, with extension into the superior mediastinum. No lymphadenopathy. No other soft tissue process evident. The patient does have pleural and parenchymal scarring at the lung apices bilaterally.  CTA HEAD  Anterior circulation: Both internal carotid arteries are widely patent through the siphon region without stenosis. The anterior and middle cerebral vessels are patent bilaterally. No abnormality is seen on the left. 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 Diameter of this vessel measures 5 mm. There is a more focal aneurysmal outpouching projecting inferiorly from the M1 segment that measures 3 mm in diameter. Distal to that, the vessel appears normal.  Posterior circulation: Both vertebral arteries are patent through the foramen magnum to the basilar. No basilar stenosis. Posterior circulation branch vessels are normal.  Venous sinuses: Patent and normal.  Anatomic variants: None significant  Delayed phase: No abnormal enhancement  IMPRESSION: No evidence of vascular pathology  in the neck to explain right-sided neck pain. No soft tissue lesion identified in the right side of the neck on this study. The patient does have thyroid goiter, but that would not explain the clinical presentation.  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 The significance of this finding in a 74 year old patient without intracranial symptoms is debatable. Nonetheless, referral to a neurovascular specialist as a nonemergent outpatient would be suggested.   Electronically Signed   By: Nelson Chimes M.D.   On: 03/22/2015 17:35   Ct Angio Neck W/cm &/or Wo/cm  03/22/2015   CLINICAL DATA:  Right neck pain radiating to the shoulder region. Acute onset.  EXAM: CT ANGIOGRAPHY HEAD AND NECK  TECHNIQUE: Multidetector CT imaging of the head and neck was performed using the standard protocol during bolus administration of intravenous contrast. Multiplanar CT image reconstructions and MIPs were obtained to evaluate the vascular anatomy. Carotid stenosis measurements (when applicable) are obtained utilizing NASCET criteria, using the distal internal carotid  diameter as the denominator.  CONTRAST:  71mL OMNIPAQUE IOHEXOL 350 MG/ML SOLN  COMPARISON:  MRI head 04/18/2012.  FINDINGS: CT HEAD  The brain shows mild age related atrophy. No evidence of old or acute focal infarction, mass lesion, hemorrhage, hydrocephalus or extra-axial collection. Sinuses, middle ears and mastoids are clear. There is atherosclerotic calcification of the major vessels at the base of the brain.  CTA NECK  Aortic arch: Atherosclerosis of the aorta but no evidence of dissection. Branching pattern of the brachiocephalic vessels from the arch is normal. No origin stenoses.  Right carotid system: Common carotid artery is widely patent to the bifurcation region. Minimal atherosclerotic change at the bifurcation but no stenosis or significant irregularity. Right cervical internal carotid artery is tortuous but otherwise normal.  Left carotid system: Common carotid artery is widely patent to the bifurcation. The bifurcation is normal. The left cervical internal carotid artery is tortuous but widely patent.  Vertebral arteries:No proximal subclavian stenosis. Both vertebral artery origins are widely patent. The left vertebral artery is dominant. Both vertebral arteries are widely patent through the cervical region.  Skeleton: Cervical facet arthritis which could be a cause of neck pain. Mild cervical spondylosis at C6-7 without significant narrowing of the canal or foramina.  Other neck: Thyroid goiter, more extensive on the left than the right, with extension into the superior mediastinum. No lymphadenopathy. No other soft tissue process evident. The patient does have pleural and parenchymal scarring at the lung apices bilaterally.  CTA HEAD  Anterior circulation: Both internal carotid arteries are widely patent through the siphon region without stenosis. The anterior and middle cerebral vessels are patent bilaterally. No abnormality is seen on the left. 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 Diameter of this vessel measures 5 mm. There is a more focal aneurysmal outpouching projecting inferiorly from the M1 segment that measures 3 mm in diameter. Distal to that, the vessel appears normal.  Posterior circulation: Both vertebral arteries are patent through the foramen magnum to the basilar. No basilar stenosis. Posterior circulation branch vessels are normal.  Venous sinuses: Patent and normal.  Anatomic variants:  None significant  Delayed phase: No abnormal enhancement  IMPRESSION: No evidence of vascular pathology in the neck to explain right-sided neck pain. No soft tissue lesion identified in the right side of the neck on this study. The patient does have thyroid goiter, but that would not explain the clinical presentation.  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 The significance of this finding in a 74 year old patient without intracranial symptoms is debatable. Nonetheless, referral to a neurovascular specialist as a nonemergent outpatient would be suggested.   Electronically Signed   By: Nelson Chimes M.D.   On: 03/22/2015 17:35    EKG: Independently reviewed. Sinus tachycardia normal axis  Assessment/Plan Acute encephalopathy due to UTI (lower urinary tract infection) - CT scan of the head showed no acute pathology, she is lethargic but able to answer some questions, this most likely due to her infectious etiology. I will get urine cultures and blood cultures start her on Rocephin empirically. - Place nothing by mouth until the patient is more awake, we'll give her Tylenol for fevers. We'll hold her Dilaudid and Xanax as she is mildly  lethargic and this could aggravate it. - The abnormal TSH is probably due to her illness. We'll have to recheck in 6 weeks. - Check blood cultures 2. - Heparin for DVT prophylaxis.  Anxiety - Hold her Dilaudid and Xanax. We'll give her Haldol in case she develop acute confusional state overnight.  Hypovolemic Hyponatremia , mild: - Likely deep to decreased oral intake start her on IV fluids hold diuretics. Check a basic metabolic panel in the morning. Code Status: full DVT Prophylaxis:heparin Family Communication: none Disposition Plan: inpatient  Time spent: 65 min  Charlynne Cousins [facility] Pager [phone]

## 2015-03-22 NOTE — ED Notes (Addendum)
Hooked pt back up to the monitor with a BP cuff and pulse ox

## 2015-03-22 NOTE — ED Provider Notes (Signed)
History   Chief Complaint  Patient presents with  . Shoulder Pain  . Nausea  . Anxiety    HPI 74 year old female with past medical history as below presents to ED for evaluation of shoulder/neck/head pain. Patient is accompanied by her daughter who is providing history today. Daughter states patient has undiagnosed psychiatric disorder, bipolar, somatizations and has had episodes in the past where she gets debilitated and unable to care for herself. She states patient lives alone currently. Patient states since having his surveillance CT scan a couple weeks ago where she had to lay on her stomach with her arms raised she has had shoulder/neck pain which has progressively been worsening. She states prior to this she was in her usual state of health, able to care for herself fully and independently, and without any pain. She's been taking Aleve and stretching without improvement. Daughter states she had to go out of town this past week and during that time patient complained of worsening symptoms and has been bedbound and not able to care for herself. They deny any fevers, cough, chest pain, shortness of breath. Daughter and patient state patient has severe anxiety and her Xanax as recently been increased. They have seen her PCP this past week and had x-rays of her neck which were negative. Patient is also complaining of dizziness and she describes this as room spinning sensation which causes her to have several episodes of nonbilious nonbloody emesis. She denies history of vertigo in the past. No trauma to her head/neck/shoulder. Patient denies any weakness, numbness, tingling, vision changes, other symptoms. No other complaints at this time.  Daughter states she is concerned patient is having delusions and is requesting a psych evaluation. She states she does not think patient is actually dizzy. She says she has done the same thing a few years ago where she had become debilitated and unable to care for  herself but then slowly came out of it.  Past medical/surgical history, social history, medications, allergies and FH have been reviewed with patient and/or in documentation. Furthermore, if pt family or friend(s) present, additional historical information was obtained from them.  Past Medical History  Diagnosis Date  . Hypothyroidism   . Edema     idiopathic  . Sinusitis, chronic   . Hyperlipidemia   . Osteopenia   . Cancer    Past Surgical History  Procedure Laterality Date  . Cataract extraction      bilaterally  . Tubal ligation    . Colonoscopy with propofol N/A 11/16/2012    Procedure: COLONOSCOPY WITH PROPOFOL;  Surgeon: Garlan Fair, Blanchard;  Location: [facility];  Service: Endoscopy;  Laterality: N/A;  h&p in file-hope  . Colectomy  05/2013   Family History  Problem Relation Age of Onset  . Lung cancer Mother   . Hypertension Father   . Heart disease Father   . Cancer - Other Brother   . Diabetes Brother    Social History  Substance Use Topics  . Smoking status: Never Smoker   . Smokeless tobacco: Never Used  . Alcohol Use: No     Review of Systems Constitutional: - F/C, +fatigue.  HENT: - congestion, -rhinorrhea, -sore throat.   Eyes: - eye pain, -visual disturbance.  Respiratory: - cough, -SOB, -hemoptysis.   Cardiovascular: - CP, -palps.  Gastrointestinal: + N/V, -abd pain  Genitourinary: - flank pain, -dysuria, -frequency.  Musculoskeletal: - myalgia/arthritis, -joint swelling, -gait abnormality, -back pain, +neck pain, -leg pain/swelling.  Skin: - rash/lesion.  Neurological: - focal weakness, -lightheadedness, +dizziness, -numbness, +HA.  All other systems reviewed and are negative.   Physical Exam  Physical Exam  ED Triage Vitals  Enc Vitals Group     BP 03/22/15 1341 120/77 mmHg     Pulse Rate 03/22/15 1341 97     Resp 03/22/15 1341 18     Temp 03/22/15 1341 97.6 F (36.4 C)     Temp Source 03/22/15 1341 Oral     SpO2 03/22/15 1341 96 %      Weight 03/22/15 1341 135 lb (61.236 kg)     Height 03/22/15 1341 5\' 5"  (1.651 m)     Head Cir --      Peak Flow --      Pain Score 03/22/15 1342 8     Pain Loc --      Pain Edu? --      Excl. in [place]? --    Constitutional: Chronically ill appearing 74 year old female who appears fatigued and is in otherwise no apparent distress. Head: Normocephalic and atraumatic.  Eyes: Extraocular motion intact, no scleral icterus Mouth: MMM, OP clear Neck: Supple without meningismus, mass, or overt JVD Respiratory: No respiratory distress. Normal WOB. No w/r/g. CV: RRR, no obvious murmurs.  Pulses +2 and symmetric. Euvolemic Abdomen: Soft, NT, ND, no r/g. No mass.  MSK: Extremities are atraumatic without deformity, ROM intact Skin: Warm, dry, intact without rash Neuro: HDS, AAOx4. PERRL, EOMI, TML, face sym. CN 2-12 grossly intact. 5/5 sym, no drift, SILT, normal gait and coordination. Unremarkable hints exam.   ED Course  Procedures   Labs Reviewed  BASIC METABOLIC PANEL - Abnormal; Notable for the following:    Sodium 133 (*)    Chloride 96 (*)    Glucose, Bld 113 (*)    All other components within normal limits  CBC - Abnormal; Notable for the following:    Hemoglobin 15.1 (*)    All other components within normal limits  URINALYSIS, ROUTINE W REFLEX MICROSCOPIC (NOT AT [facility]) - Abnormal; Notable for the following:    APPearance TURBID (*)    Hgb urine dipstick MODERATE (*)    Nitrite POSITIVE (*)    Leukocytes, UA LARGE (*)    All other components within normal limits  TSH - Abnormal; Notable for the following:    TSH 4.521 (*)    All other components within normal limits  URINE MICROSCOPIC-ADD ON - Abnormal; Notable for the following:    Squamous Epithelial / LPF FEW (*)    Bacteria, UA MANY (*)    All other components within normal limits  CULTURE, BLOOD (ROUTINE X 2)  CULTURE, BLOOD (ROUTINE X 2)  CBC  BASIC METABOLIC PANEL   I personally reviewed and interpreted all  labs.  Ct Angio Head W/cm &/or Wo Cm  03/22/2015   CLINICAL DATA:  Right neck pain radiating to the shoulder region. Acute onset.  EXAM: CT ANGIOGRAPHY HEAD AND NECK  TECHNIQUE: Multidetector CT imaging of the head and neck was performed using the standard protocol during bolus administration of intravenous contrast. Multiplanar CT image reconstructions and MIPs were obtained to evaluate the vascular anatomy. Carotid stenosis measurements (when applicable) are obtained utilizing NASCET criteria, using the distal internal carotid diameter as the denominator.  CONTRAST:  41mL OMNIPAQUE IOHEXOL 350 MG/ML SOLN  COMPARISON:  MRI head 04/18/2012.  FINDINGS: CT HEAD  The brain shows mild age related atrophy. No evidence of old or acute focal infarction, mass lesion, hemorrhage, hydrocephalus  or extra-axial collection. Sinuses, middle ears and mastoids are clear. There is atherosclerotic calcification of the major vessels at the base of the brain.  CTA NECK  Aortic arch: Atherosclerosis of the aorta but no evidence of dissection. Branching pattern of the brachiocephalic vessels from the arch is normal. No origin stenoses.  Right carotid system: Common carotid artery is widely patent to the bifurcation region. Minimal atherosclerotic change at the bifurcation but no stenosis or significant irregularity. Right cervical internal carotid artery is tortuous but otherwise normal.  Left carotid system: Common carotid artery is widely patent to the bifurcation. The bifurcation is normal. The left cervical internal carotid artery is tortuous but widely patent.  Vertebral arteries:No proximal subclavian stenosis. Both vertebral artery origins are widely patent. The left vertebral artery is dominant. Both vertebral arteries are widely patent through the cervical region.  Skeleton: Cervical facet arthritis which could be a cause of neck pain. Mild cervical spondylosis at C6-7 without significant narrowing of the canal or foramina.   Other neck: Thyroid goiter, more extensive on the left than the right, with extension into the superior mediastinum. No lymphadenopathy. No other soft tissue process evident. The patient does have pleural and parenchymal scarring at the lung apices bilaterally.  CTA HEAD  Anterior circulation: Both internal carotid arteries are widely patent through the siphon region without stenosis. The anterior and middle cerebral vessels are patent bilaterally. No abnormality is seen on the left. 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 Diameter of this vessel measures 5 mm. There is a more focal aneurysmal outpouching projecting inferiorly from the M1 segment that measures 3 mm in diameter. Distal to that, the vessel appears normal.  Posterior circulation: Both vertebral arteries are patent through the foramen magnum to the basilar. No basilar stenosis. Posterior circulation branch vessels are normal.  Venous sinuses: Patent and normal.  Anatomic variants: None significant  Delayed phase: No abnormal enhancement  IMPRESSION: No evidence of vascular pathology in the neck to explain right-sided neck pain. No soft tissue lesion identified in the right side of the neck on this study. The patient does have thyroid goiter, but that would not explain the clinical presentation.  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 The significance of this finding in a 74 year old patient without intracranial symptoms is debatable. Nonetheless, referral to a neurovascular specialist as a nonemergent outpatient would be suggested.   Electronically Signed   By: Nelson Chimes M.D.   On:  03/22/2015 17:35   Ct Angio Neck W/cm &/or Wo/cm  03/22/2015   CLINICAL DATA:  Right neck pain radiating to the shoulder region. Acute onset.  EXAM: CT ANGIOGRAPHY HEAD AND NECK  TECHNIQUE: Multidetector CT imaging of the head and neck was performed using the standard protocol during bolus administration of intravenous contrast. Multiplanar CT image reconstructions and MIPs were obtained to evaluate the vascular anatomy. Carotid stenosis measurements (when applicable) are obtained utilizing NASCET criteria, using the distal internal carotid diameter as the denominator.  CONTRAST:  55mL OMNIPAQUE IOHEXOL 350 MG/ML SOLN  COMPARISON:  MRI head 04/18/2012.  FINDINGS: CT HEAD  The brain shows mild  age related atrophy. No evidence of old or acute focal infarction, mass lesion, hemorrhage, hydrocephalus or extra-axial collection. Sinuses, middle ears and mastoids are clear. There is atherosclerotic calcification of the major vessels at the base of the brain.  CTA NECK  Aortic arch: Atherosclerosis of the aorta but no evidence of dissection. Branching pattern of the brachiocephalic vessels from the arch is normal. No origin stenoses.  Right carotid system: Common carotid artery is widely patent to the bifurcation region. Minimal atherosclerotic change at the bifurcation but no stenosis or significant irregularity. Right cervical internal carotid artery is tortuous but otherwise normal.  Left carotid system: Common carotid artery is widely patent to the bifurcation. The bifurcation is normal. The left cervical internal carotid artery is tortuous but widely patent.  Vertebral arteries:No proximal subclavian stenosis. Both vertebral artery origins are widely patent. The left vertebral artery is dominant. Both vertebral arteries are widely patent through the cervical region.  Skeleton: Cervical facet arthritis which could be a cause of neck pain. Mild cervical spondylosis at C6-7 without significant narrowing of the canal or  foramina.  Other neck: Thyroid goiter, more extensive on the left than the right, with extension into the superior mediastinum. No lymphadenopathy. No other soft tissue process evident. The patient does have pleural and parenchymal scarring at the lung apices bilaterally.  CTA HEAD  Anterior circulation: Both internal carotid arteries are widely patent through the siphon region without stenosis. The anterior and middle cerebral vessels are patent bilaterally. No abnormality is seen on the left. 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 Diameter of this vessel measures 5 mm. There is a more focal aneurysmal outpouching projecting inferiorly from the M1 segment that measures 3 mm in diameter. Distal to that, the vessel appears normal.  Posterior circulation: Both vertebral arteries are patent through the foramen magnum to the basilar. No basilar stenosis. Posterior circulation branch vessels are normal.  Venous sinuses: Patent and normal.  Anatomic variants: None significant  Delayed phase: No abnormal enhancement  IMPRESSION: No evidence of vascular pathology in the neck to explain right-sided neck pain. No soft tissue lesion identified in the right side of the neck on this study. The patient does have thyroid goiter, but that would not explain the clinical presentation.  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 The significance of this finding in a 74 year old patient without intracranial symptoms is debatable. Nonetheless, referral to a neurovascular specialist as a nonemergent outpatient would be suggested.   Electronically Signed   By: Nelson Chimes M.D.    On: 03/22/2015 17:35   I personally viewed above image(s) which were used in my medical decision making. Formal interpretations by Radiology.   EKG Interpretation  Date/Time:  Saturday March 22 2015 13:54:10 EDT Ventricular Rate:  102 PR Interval:  204 QRS Duration: 76 QT Interval:  340 QTC Calculation: 443 R Axis:   63 Text Interpretation:  Sinus tachycardia Biatrial enlargement Anterior infarct , age undetermined No previous tracing Confirmed by Mackenzie Rued  Blanchard, Mackenzie Blanchard ([id number]) on 03/22/2015 3:22:35 PM       MDM: Mackenzie Blanchard is a 74 y.o.  female with H&P as above who p/w CC: Neck pain, dizziness, anxiety  On arrival, patient is hemodynamically stable and in mild distress. She appears very fatigued and weak. This is 100% different than her norm per her family. Patient is now unable to do any of her ADLs and is bedridden. Screening labs were sent and patient was treated for her headache. Additionally, given her neck pain, dizziness which is new concern for carotid/vertebral dissection and CTA head and neck were ordered.  Workup was notable for UTI and patient was treated with Rocephin for this. 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  After headache cocktail patient reports her headache has resolved but she is still having shoulder pain and is feeling very anxious and fatigued. Patient was given morphine and Ativan. Given patient's functional decline in the setting of UTI and ongoing symptoms, will admit patient for further management. Additionally, TTS was consult is regarding patient's history of medication and they state they will see patient on the inpatient side.  Old records reviewed (if available). Labs and imaging reviewed personally by myself and considered in medical decision making if ordered.  Clinical Impression: 1. 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 Neck pain   3. UTI (lower urinary tract infection)   4. Anxiety     Disposition: Admit  Condition: stable  I have discussed the results, Dx and Tx plan with the pt(& family if present). He/she/they expressed understanding and agree(s) with the plan.  Pt seen in conjunction with Dr. Blanchie Dessert, Blanchard  Mackenzie Blanchard, [place] [facility] Resident - PGY-3     Mackenzie Peri, Blanchard 03/23/15 [id number]  Blanchie Dessert, Blanchard 03/23/15 [id number]

## 2015-03-22 NOTE — ED Notes (Signed)
Gave pt water and applesauce.  

## 2015-03-22 NOTE — [facility] Counselor (Signed)
Writer spoke w EDP Hanley Ben re: pt's teleassessment order. Nadara Mustard reports that pt is going to be medically admitted d/t UTI. Therefore, TTS consult not warranted.  Arnold Long, [place] [facility] Triage Specialist

## 2015-03-22 NOTE — Progress Notes (Addendum)
Patient arrived to 5M17 sedated with daughter at bedside at 40.

## 2015-03-23 DIAGNOSIS — M6248 Contracture of muscle, other site: Secondary | ICD-10-CM

## 2015-03-23 DIAGNOSIS — G9340 Encephalopathy, unspecified: Secondary | ICD-10-CM

## 2015-03-23 DIAGNOSIS — M62838 Other muscle spasm: Secondary | ICD-10-CM | POA: Diagnosis present

## 2015-03-23 DIAGNOSIS — N390 Urinary tract infection, site not specified: Principal | ICD-10-CM

## 2015-03-23 LAB — BASIC METABOLIC PANEL
ANION GAP: 8 (ref 5–15)
BUN: 16 mg/dL (ref 6–20)
CHLORIDE: 100 mmol/L — AB (ref 101–111)
CO2: 24 mmol/L (ref 22–32)
Calcium: 8.7 mg/dL — ABNORMAL LOW (ref 8.9–10.3)
Creatinine, Ser: 0.53 mg/dL (ref 0.44–1.00)
Glucose, Bld: 101 mg/dL — ABNORMAL HIGH (ref 65–99)
POTASSIUM: 4 mmol/L (ref 3.5–5.1)
SODIUM: 132 mmol/L — AB (ref 135–145)

## 2015-03-23 LAB — CBC
HCT: 38.2 % (ref 36.0–46.0)
HEMOGLOBIN: 12.8 g/dL (ref 12.0–15.0)
MCH: 30.5 pg (ref 26.0–34.0)
MCHC: 33.5 g/dL (ref 30.0–36.0)
MCV: 91.2 fL (ref 78.0–100.0)
PLATELETS: 262 10*3/uL (ref 150–400)
RBC: 4.19 MIL/uL (ref 3.87–5.11)
RDW: 12.7 % (ref 11.5–15.5)
WBC: 4.4 10*3/uL (ref 4.0–10.5)

## 2015-03-23 MED ORDER — KETOROLAC TROMETHAMINE 30 MG/ML IJ SOLN
30.0000 mg | Freq: Once | INTRAMUSCULAR | Status: AC
  Administered 2015-03-23: 30 mg via INTRAVENOUS
  Filled 2015-03-23: qty 1

## 2015-03-23 MED ORDER — HYDROMORPHONE HCL 2 MG PO TABS
1.0000 mg | ORAL_TABLET | ORAL | Status: DC | PRN
  Administered 2015-03-23: 1 mg via ORAL
  Filled 2015-03-23: qty 1

## 2015-03-23 MED ORDER — CYCLOBENZAPRINE HCL 10 MG PO TABS
5.0000 mg | ORAL_TABLET | Freq: Once | ORAL | Status: AC
  Administered 2015-03-23: 5 mg via ORAL
  Filled 2015-03-23: qty 1

## 2015-03-23 MED ORDER — DICLOFENAC SODIUM 1 % TD GEL
2.0000 g | Freq: Four times a day (QID) | TRANSDERMAL | Status: DC
  Administered 2015-03-23 – 2015-03-25 (×8): 2 g via TOPICAL
  Filled 2015-03-23: qty 100

## 2015-03-23 MED ORDER — ALPRAZOLAM 0.5 MG PO TABS
0.5000 mg | ORAL_TABLET | Freq: Two times a day (BID) | ORAL | Status: DC | PRN
  Administered 2015-03-23 – 2015-03-24 (×3): 0.5 mg via ORAL
  Filled 2015-03-23 (×3): qty 1

## 2015-03-23 NOTE — Progress Notes (Signed)
Utilization Review Completed.Marley Pakula T8/21/2016  

## 2015-03-23 NOTE — Progress Notes (Signed)
[facility] PROGRESS NOTE  Mackenzie Blanchard ZOX:[id number] DOB: 07-01-1941 DOA: 03/22/2015 PCP: Mathews Argyle, MD  Assessment/Plan:  Principal Problem:   UTI (lower urinary tract infection) Active Problems:   Weakness   Acute encephalopathy   Anxiety   Hyponatremia   Muscle spasms of neck  Continue rocephin. Add flexeril, toradol, voltaren gel for neck spasm. PT, OT eval.  Code Status:  full Family Communication:  daughter Disposition Plan:  Home when improved.  HPI/Subjective:  C/o neck and shoulder pain since CT abdomen and pelvis done as outpatient at [facility].  Scheduled for cystoscopy as outpatient for hematuria.  Objective: Filed Vitals:   03/23/15 1410  BP: 128/64  Pulse: 88  Temp: 98.5 F (36.9 C)  Resp: 20    Intake/Output Summary (Last 24 hours) at 03/23/15 1413 Last data filed at 03/23/15 0733  Gross per 24 hour  Intake    120 ml  Output      0 ml  Net    120 ml   Filed Weights   03/22/15 1341 03/22/15 1910  Weight: 61.236 kg (135 lb) 64.501 kg (142 lb 3.2 oz)    Exam:   General:  A and o. tearful  Cardiovascular: RRR without MGR  Respiratory: CTA without WRR  Abdomen: S, nt, nd  Ext: no CCE  Musculoskeletal. Muscle spasm right neck. Right shoulder nontender and full ROM  Basic Metabolic Panel:  Recent Labs Lab 03/22/15 1357 03/23/15 0705  NA 133* 132*  K 4.0 4.0  CL 96* 100*  CO2 27 24  GLUCOSE 113* 101*  BUN 15 16  CREATININE 0.83 0.53  CALCIUM 9.6 8.7*   Liver Function Tests: No results for input(s): AST, ALT, ALKPHOS, BILITOT, PROT, ALBUMIN in the last 168 hours. No results for input(s): LIPASE, AMYLASE in the last 168 hours. No results for input(s): AMMONIA in the last 168 hours. CBC:  Recent Labs Lab 03/22/15 1357 03/23/15 0705  WBC 5.6 4.4  HGB 15.1* 12.8  HCT 44.0 38.2  MCV 90.9 91.2  PLT 268 262   Cardiac Enzymes: No results for input(s): CKTOTAL, CKMB, CKMBINDEX, TROPONINI in the  last 168 hours. BNP (last 3 results) No results for input(s): BNP in the last 8760 hours.  ProBNP (last 3 results) No results for input(s): PROBNP in the last 8760 hours.  CBG: No results for input(s): GLUCAP in the last 168 hours.  No results found for this or any previous visit (from the past 240 hour(s)).   Studies: Ct Angio Head W/cm &/or Wo Cm  03/22/2015   CLINICAL DATA:  Right neck pain radiating to the shoulder region. Acute onset.  EXAM: CT ANGIOGRAPHY HEAD AND NECK  TECHNIQUE: Multidetector CT imaging of the head and neck was performed using the standard protocol during bolus administration of intravenous contrast. Multiplanar CT image reconstructions and MIPs were obtained to evaluate the vascular anatomy. Carotid stenosis measurements (when applicable) are obtained utilizing NASCET criteria, using the distal internal carotid diameter as the denominator.  CONTRAST:  28mL OMNIPAQUE IOHEXOL 350 MG/ML SOLN  COMPARISON:  MRI head 04/18/2012.  FINDINGS: CT HEAD  The brain shows mild age related atrophy. No evidence of old or acute focal infarction, mass lesion, hemorrhage, hydrocephalus or extra-axial collection. Sinuses, middle ears and mastoids are clear. There is atherosclerotic calcification of the major vessels at the base of the brain.  CTA NECK  Aortic arch: Atherosclerosis of the aorta but no evidence of dissection. Branching pattern of the brachiocephalic vessels from  the arch is normal. No origin stenoses.  Right carotid system: Common carotid artery is widely patent to the bifurcation region. Minimal atherosclerotic change at the bifurcation but no stenosis or significant irregularity. Right cervical internal carotid artery is tortuous but otherwise normal.  Left carotid system: Common carotid artery is widely patent to the bifurcation. The bifurcation is normal. The left cervical internal carotid artery is tortuous but widely patent.  Vertebral arteries:No proximal subclavian  stenosis. Both vertebral artery origins are widely patent. The left vertebral artery is dominant. Both vertebral arteries are widely patent through the cervical region.  Skeleton: Cervical facet arthritis which could be a cause of neck pain. Mild cervical spondylosis at C6-7 without significant narrowing of the canal or foramina.  Other neck: Thyroid goiter, more extensive on the left than the right, with extension into the superior mediastinum. No lymphadenopathy. No other soft tissue process evident. The patient does have pleural and parenchymal scarring at the lung apices bilaterally.  CTA HEAD  Anterior circulation: Both internal carotid arteries are widely patent through the siphon region without stenosis. The anterior and middle cerebral vessels are patent bilaterally. No abnormality is seen on the left. 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 Diameter of this vessel measures 5 mm. There is a more focal aneurysmal outpouching projecting inferiorly from the M1 segment that measures 3 mm in diameter. Distal to that, the vessel appears normal.  Posterior circulation: Both vertebral arteries are patent through the foramen magnum to the basilar. No basilar stenosis. Posterior circulation branch vessels are normal.  Venous sinuses: Patent and normal.  Anatomic variants: None significant  Delayed phase: No abnormal enhancement  IMPRESSION: No evidence of vascular pathology in the neck to explain right-sided neck pain. No soft tissue lesion identified in the right side of the neck on this study. The patient does have thyroid goiter, but that would not explain the clinical presentation.  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 The significance of this finding in a 74 year old patient without intracranial symptoms is debatable. Nonetheless, referral to a neurovascular specialist as a nonemergent outpatient would be suggested.   Electronically Signed   By: Nelson Chimes M.D.   On: 03/22/2015 17:35   Ct Angio Neck W/cm &/or Wo/cm  03/22/2015   CLINICAL DATA:  Right neck pain radiating to the shoulder region. Acute onset.  EXAM: CT ANGIOGRAPHY HEAD AND NECK  TECHNIQUE: Multidetector CT imaging of the head and neck was performed using the standard protocol during bolus administration of intravenous contrast. Multiplanar CT image reconstructions and MIPs were obtained to evaluate the vascular anatomy. Carotid stenosis measurements (when applicable) are obtained utilizing NASCET criteria, using the distal internal carotid diameter as the denominator.  CONTRAST:  12mL OMNIPAQUE IOHEXOL 350 MG/ML SOLN  COMPARISON:  MRI head 04/18/2012.  FINDINGS: CT HEAD  The brain shows mild age related atrophy. No evidence of old or acute focal infarction, mass lesion, hemorrhage, hydrocephalus or extra-axial collection. Sinuses, middle ears and mastoids are clear. There is atherosclerotic calcification of the major vessels at the base of the brain.  CTA NECK  Aortic arch: Atherosclerosis  of the aorta but no evidence of dissection. Branching pattern of the brachiocephalic vessels from the arch is normal. No origin stenoses.  Right carotid system: Common carotid artery is widely patent to the bifurcation region. Minimal atherosclerotic change at the bifurcation but no stenosis or significant irregularity. Right cervical internal carotid artery is tortuous but otherwise normal.  Left carotid system: Common carotid artery is widely patent to the bifurcation. The bifurcation is normal. The left cervical internal carotid artery is tortuous but widely patent.  Vertebral arteries:No proximal  subclavian stenosis. Both vertebral artery origins are widely patent. The left vertebral artery is dominant. Both vertebral arteries are widely patent through the cervical region.  Skeleton: Cervical facet arthritis which could be a cause of neck pain. Mild cervical spondylosis at C6-7 without significant narrowing of the canal or foramina.  Other neck: Thyroid goiter, more extensive on the left than the right, with extension into the superior mediastinum. No lymphadenopathy. No other soft tissue process evident. The patient does have pleural and parenchymal scarring at the lung apices bilaterally.  CTA HEAD  Anterior circulation: Both internal carotid arteries are widely patent through the siphon region without stenosis. The anterior and middle cerebral vessels are patent bilaterally. No abnormality is seen on the left. 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 Diameter of this vessel measures 5 mm. There is a more focal aneurysmal outpouching projecting inferiorly from the M1 segment that measures 3 mm in diameter. Distal to that, the vessel appears normal.  Posterior circulation: Both vertebral arteries are patent through the foramen magnum to the basilar. No basilar stenosis. Posterior circulation branch vessels are normal.  Venous sinuses: Patent and normal.  Anatomic variants: None significant  Delayed phase: No abnormal enhancement  IMPRESSION: No evidence of vascular pathology in the neck to explain right-sided neck pain. No soft tissue lesion identified in the right side of the neck on this study. The patient does have thyroid goiter, but that would not explain the clinical presentation.  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 There is  a focal inferiorly projecting aneurysm from that in the M1 segment measuring 3 mm. The significance of this finding in a 74 year old patient without intracranial symptoms is debatable. Nonetheless, referral to a neurovascular specialist as a nonemergent outpatient would be suggested.   Electronically Signed   By: Nelson Chimes M.D.   On: 03/22/2015 17:35    Scheduled Meds: . aspirin EC  81 mg Oral Daily  . cefTRIAXone (ROCEPHIN)  IV  1 g Intravenous Q24H  . cyclobenzaprine  5 mg Oral Once  . diclofenac sodium  2 g Topical QID  . heparin  5,000 Units Subcutaneous 3 times per day  . ketorolac  30 mg Intravenous Once  . levothyroxine  75 mcg Oral QAC breakfast  . traZODone  50 mg Oral QHS   Continuous Infusions: . sodium chloride 75 mL/hr at 03/22/15 2103    Time spent: 35 minutes  [facility] [email or web address], password [facility] 03/23/2015, 2:13 PM  LOS: 1 day

## 2015-03-23 NOTE — Progress Notes (Signed)
Patient states that she feels she has diarrhea and wants some Imodium.  Daughter states she had a colon resection done 2 years ago. Will advise physician.

## 2015-03-23 NOTE — Progress Notes (Signed)
Patient lying in bed.  Gave another ice pack for neck pain along with tylenol. No other distress, family friend at bedside.

## 2015-03-23 NOTE — Progress Notes (Signed)
Patient in bed, no distress or pain, daughter at bedside

## 2015-03-23 NOTE — Progress Notes (Signed)
Patient sitting up in bed, no distress, pain is at a 3.  Family member at bedside

## 2015-03-24 DIAGNOSIS — F419 Anxiety disorder, unspecified: Secondary | ICD-10-CM

## 2015-03-24 LAB — URINE CULTURE: CULTURE: NO GROWTH

## 2015-03-24 MED ORDER — CYCLOBENZAPRINE HCL 10 MG PO TABS
5.0000 mg | ORAL_TABLET | Freq: Every day | ORAL | Status: DC
  Administered 2015-03-24: 5 mg via ORAL
  Filled 2015-03-24: qty 1

## 2015-03-24 MED ORDER — DIPHENHYDRAMINE HCL 25 MG PO CAPS
50.0000 mg | ORAL_CAPSULE | Freq: Once | ORAL | Status: AC
  Administered 2015-03-24: 50 mg via ORAL
  Filled 2015-03-24: qty 2

## 2015-03-24 MED ORDER — KETOROLAC TROMETHAMINE 30 MG/ML IJ SOLN
30.0000 mg | Freq: Once | INTRAMUSCULAR | Status: AC
  Administered 2015-03-24: 30 mg via INTRAVENOUS
  Filled 2015-03-24: qty 1

## 2015-03-24 MED ORDER — SERTRALINE HCL 50 MG PO TABS
25.0000 mg | ORAL_TABLET | Freq: Every day | ORAL | Status: DC
  Administered 2015-03-24 – 2015-03-25 (×2): 25 mg via ORAL
  Filled 2015-03-24 (×2): qty 1

## 2015-03-24 MED ORDER — LOPERAMIDE HCL 2 MG PO CAPS
2.0000 mg | ORAL_CAPSULE | ORAL | Status: DC | PRN
  Administered 2015-03-24: 2 mg via ORAL
  Filled 2015-03-24: qty 1

## 2015-03-24 NOTE — Progress Notes (Signed)
Chaplain responded to consult that pt desired information about advanced directives.  Chaplain met with pt at bedside.  Chaplain delivered Adv. Dir. Education to pt who wishes to review later with daughter and will page [place] back when ready to complete document.  Chaplain available to follow up as/when needed.    03/24/15 1100  Clinical Encounter Type  Visited With Patient  Visit Type Initial;Social support  Referral From Nurse  Spiritual Encounters  Spiritual Needs Literature;Emotional  Stress Factors  Patient Stress Factors Exhausted;Family relationships  Advance Directives (For Healthcare)  Does patient have an advance directive? No  Would patient like information on creating an advanced directive? Yes - Educational materials given   Geralyn Flash 03/24/2015 11:13 AM

## 2015-03-24 NOTE — Evaluation (Addendum)
Physical Therapy Evaluation Patient Details Name: Mackenzie Blanchard MRN: [id number] DOB: 09-25-1940 Today's Date: 03/24/2015   History of Present Illness  Patient is a 74 y/o female with hx of undiagnosed psychiatric disorders, per daughter, who presents to the ED for confusion and shoulder, neck and head pain. UA was done that shows possible urinary tract infection. PMH includes HLD, cancer, osteopenia.  Clinical Impression  Patient presents with pain in shoulder/neck, generalized weakness and balance deficits impacting safe mobility. Pt independent PTA and very active gardening, driving, cooking etc. Encouraged OOB as much as tolerated and ambulation. Pt would benefit from acute PT and OPPT to help relieve tension/tightness in cervical spine/shoulder and improve overall functional mobility. Encourage use of RW until strength/mobility improve. Will follow acutely.     Follow Up Recommendations Home Health PT; 24/7 Supervision    Equipment Recommendations  None recommended by PT    Recommendations for Other Services       Precautions / Restrictions Precautions Precautions: Fall Restrictions Weight Bearing Restrictions: No      Mobility  Bed Mobility Overal bed mobility: Needs Assistance Bed Mobility: Supine to Sit     Supine to sit: Supervision;HOB elevated     General bed mobility comments: Supervision for safety. Use of rails for support.   Transfers Overall transfer level: Needs assistance Equipment used: None Transfers: Sit to/from Stand Sit to Stand: Min guard         General transfer comment: Min guard for safety. + dizziness upon standing.  Ambulation/Gait Ambulation/Gait assistance: Min guard Ambulation Distance (Feet): 75 Feet Assistive device: Rolling walker (2 wheeled) Gait Pattern/deviations: Step-through pattern;Decreased stride length;Trunk flexed   Gait velocity interpretation: <1.8 ft/sec, indicative of risk for recurrent falls General Gait  Details: Pt with very slow guarded gait. Use of RW for support. Reports weakness in BLEs - no knee buckling noted.   Stairs            Wheelchair Mobility    Modified Rankin (Stroke Patients Only)       Balance Overall balance assessment: Needs assistance Sitting-balance support: Feet supported;No upper extremity supported Sitting balance-Leahy Scale: Fair Sitting balance - Comments: Assist to donn socks. Able to doff socks with encouragement.   Standing balance support: During functional activity Standing balance-Leahy Scale: Fair                               Pertinent Vitals/Pain Pain Assessment: Faces Faces Pain Scale: Hurts whole lot Pain Location: neck shoulder/neck Pain Descriptors / Indicators: Sore;Aching;Tender;Tightness Pain Intervention(s): Repositioned;Heat applied;Monitored during session    [place] expects to be discharged to:: Private residence Living Arrangements: Alone Available Help at Discharge: Family;Available PRN/intermittently Type of Home: House Home Access: Stairs to enter Entrance Stairs-Rails: Right Entrance Stairs-Number of Steps: 2 Home Layout: One level (To put trash can out, needs to descend 7 steps with HR) Home Equipment: Walker - 2 wheels;Bedside commode      Prior Function Level of Independence: Independent         Comments: Prior to 2 weeks ago, pt gardening, cooking, driving etc. reports neck/shoulder have been getting worse lately resulting in need for ED.     Hand Dominance   Dominant Hand: Right    Extremity/Trunk Assessment   Upper Extremity Assessment:  (Palpable tightness and trigger points in trapezius muscle.)           Lower Extremity Assessment: Generalized weakness  Cervical / Trunk Assessment: Kyphotic  Communication   Communication: No difficulties  Cognition Arousal/Alertness: Awake/alert Behavior During Therapy: WFL for tasks assessed/performed Overall  Cognitive Status: Within Functional Limits for tasks assessed                      General Comments General comments (skin integrity, edema, etc.): Daughter present at end of session. Education re: disposition, follow up services, use of heat/ice on neck, positioning etc.     Exercises        Assessment/Plan    PT Assessment Patient needs continued PT services  PT Diagnosis Difficulty walking;Generalized weakness;Acute pain   PT Problem List Decreased strength;Pain;Decreased range of motion;Decreased balance;Decreased activity tolerance  PT Treatment Interventions Balance training;Gait training;Stair training;Functional mobility training;Therapeutic activities;Therapeutic exercise;Patient/family education   PT Goals (Current goals can be found in the Care Plan section) Acute Rehab PT Goals Patient Stated Goal: to be able tto work in my garden and lift molt PT Goal Formulation: With patient/family Time For Goal Achievement: 04/07/15 Potential to Achieve Goals: Good    Frequency Min 3X/week   Barriers to discharge Decreased caregiver support t lives alone    Co-evaluation               End of Session Equipment Utilized During Treatment: Gait belt Activity Tolerance: Patient tolerated treatment well;Patient limited by fatigue Patient left: in bed;with call bell/phone within reach;with bed alarm set;with family/visitor present Nurse Communication: Mobility status         Time: 0941-1000 PT Time Calculation (min) (ACUTE ONLY): 19 min   Charges:   PT Evaluation $Initial PT Evaluation Tier I: 1 Procedure     PT G Codes:        Takina Busser A Saranda Legrande 03/24/2015, 11:05 AM Wray Kearns, PT, DPT [phone]

## 2015-03-24 NOTE — Care Management Note (Signed)
Case Management Note  Patient Details  Name: SHEENAH DIMITROFF MRN: [id number] Date of Birth: 05-Aug-1940  Subjective/Objective:                    Action/Plan: CM spoke extensively with PT/OT to discuss discharge needs.  Per PT/OT, patient is appropriate for [facility] therapy and then could progress to Outpatient.  They do not feel that she needs SNF at this time, but did share some concern about the fact that the patient would be discharging home alone.  CM spoke with patient, who gave permission to speak with daughter Laurelyn via phone.  CM and daughter discussed PT/OT recommendations.  Daughter verbalized understanding and is in agreement with home health, providing that patient continues to do well overnight.  She feels that patient may do better once she is in her home environment.  Daughter indicated that she would be able to arrange supervision for a few days after discharge, per PT recommendations.  CM will continue to follow for discharge needs. Message was sent to Dr Conley Canal regarding plan of care.  Expected Discharge Date:                  Expected Discharge Plan:  [place]  In-House Referral:  Clinical Social Work  Discharge planning Services  CM Consult  Post Acute Care Choice:    Choice offered to:  Patient, Adult Children  DME Arranged:    DME Agency:     HH Arranged:    [place] Agency:     Status of Service:  In process, will continue to follow  Medicare Important Message Given:    Date Medicare IM Given:    Medicare IM give by:    Date Additional Medicare IM Given:    Additional Medicare Important Message give by:     If discussed at [place] of Stay Meetings, dates discussed:    Additional Comments:  Rolm Baptise, RN 03/24/2015, 4:19 PM

## 2015-03-24 NOTE — Progress Notes (Signed)
[facility] PROGRESS NOTE  Mackenzie Blanchard KGY:[id number] DOB: [birth year] DOA: 03/22/2015 PCP: Mathews Argyle, MD  Assessment/Plan:  Principal Problem:   UTI (lower urinary tract infection): cx pending. Reviewed notes from Dr. Tresa Moore and CT pelvis. Active Problems:   Weakness   Acute encephalopathy   Anxiety: extensive conversation with patient and daughter. Suffers from severe anxiety. Daughter thinks many of patients symptoms are anxiety related and she has a somatoform disorder.  Patient would like to try low dose SSRI. Will start lexapro   Hyponatremia   Muscle spasms of neck: seems improved on exam with better ROM  Continue rocephin, flexeril, voltaren gel  Code Status:  full Family Communication:  daughter Disposition Plan:  Home when improved.  HPI/Subjective: Neck and shoulder pain no better, but daughter thinks improved. C/o insomnia. Requesting melatonin.  Objective: Filed Vitals:   03/24/15 1013  BP: 125/61  Pulse: 64  Temp: 97.5 F (36.4 C)  Resp: 16    Intake/Output Summary (Last 24 hours) at 03/24/15 1357 Last data filed at 03/24/15 1211  Gross per 24 hour  Intake    480 ml  Output    400 ml  Net     80 ml   Filed Weights   03/22/15 1341 03/22/15 1910  Weight: 61.236 kg (135 lb) 64.501 kg (142 lb 3.2 oz)    Exam:   General:  A and o.   Cardiovascular: RRR without MGR  Respiratory: CTA without WRR  Abdomen: S, nt, nd  Ext: no CCE  Musculoskeletal. Better ROM neck. nontender  Basic Metabolic Panel:  Recent Labs Lab 03/22/15 1357 03/23/15 0705  NA 133* 132*  K 4.0 4.0  CL 96* 100*  CO2 27 24  GLUCOSE 113* 101*  BUN 15 16  CREATININE 0.83 0.53  CALCIUM 9.6 8.7*   Liver Function Tests: No results for input(s): AST, ALT, ALKPHOS, BILITOT, PROT, ALBUMIN in the last 168 hours. No results for input(s): LIPASE, AMYLASE in the last 168 hours. No results for input(s): AMMONIA in the last 168 hours. CBC:  Recent Labs Lab  03/22/15 1357 03/23/15 0705  WBC 5.6 4.4  HGB 15.1* 12.8  HCT 44.0 38.2  MCV 90.9 91.2  PLT 268 262   Cardiac Enzymes: No results for input(s): CKTOTAL, CKMB, CKMBINDEX, TROPONINI in the last 168 hours. BNP (last 3 results) No results for input(s): BNP in the last 8760 hours.  ProBNP (last 3 results) No results for input(s): PROBNP in the last 8760 hours.  CBG: No results for input(s): GLUCAP in the last 168 hours.  Recent Results (from the past 240 hour(s))  Culture, blood (routine x 2)     Status: None (Preliminary result)   Collection Time: 03/22/15  9:26 PM  Result Value Ref Range Status   Specimen Description BLOOD LEFT ANTECUBITAL  Final   Special Requests BOTTLES DRAWN AEROBIC AND ANAEROBIC 5CC   Final   Culture NO GROWTH 2 DAYS  Final   Report Status PENDING  Incomplete  Culture, blood (routine x 2)     Status: None (Preliminary result)   Collection Time: 03/22/15  9:32 PM  Result Value Ref Range Status   Specimen Description BLOOD LEFT HAND  Final   Special Requests BOTTLES DRAWN AEROBIC AND ANAEROBIC 5CC   Final   Culture NO GROWTH 2 DAYS  Final   Report Status PENDING  Incomplete  Culture, Urine     Status: None   Collection Time: 03/23/15  3:02 PM  Result Value  Ref Range Status   Specimen Description URINE, RANDOM  Final   Special Requests NONE  Final   Culture NO GROWTH 1 DAY  Final   Report Status 03/24/2015 FINAL  Final     Studies: Ct Angio Head W/cm &/or Wo Cm  03/22/2015   CLINICAL DATA:  Right neck pain radiating to the shoulder region. Acute onset.  EXAM: CT ANGIOGRAPHY HEAD AND NECK  TECHNIQUE: Multidetector CT imaging of the head and neck was performed using the standard protocol during bolus administration of intravenous contrast. Multiplanar CT image reconstructions and MIPs were obtained to evaluate the vascular anatomy. Carotid stenosis measurements (when applicable) are obtained utilizing NASCET criteria, using the distal internal carotid  diameter as the denominator.  CONTRAST:  61mL OMNIPAQUE IOHEXOL 350 MG/ML SOLN  COMPARISON:  MRI head 04/18/2012.  FINDINGS: CT HEAD  The brain shows mild age related atrophy. No evidence of old or acute focal infarction, mass lesion, hemorrhage, hydrocephalus or extra-axial collection. Sinuses, middle ears and mastoids are clear. There is atherosclerotic calcification of the major vessels at the base of the brain.  CTA NECK  Aortic arch: Atherosclerosis of the aorta but no evidence of dissection. Branching pattern of the brachiocephalic vessels from the arch is normal. No origin stenoses.  Right carotid system: Common carotid artery is widely patent to the bifurcation region. Minimal atherosclerotic change at the bifurcation but no stenosis or significant irregularity. Right cervical internal carotid artery is tortuous but otherwise normal.  Left carotid system: Common carotid artery is widely patent to the bifurcation. The bifurcation is normal. The left cervical internal carotid artery is tortuous but widely patent.  Vertebral arteries:No proximal subclavian stenosis. Both vertebral artery origins are widely patent. The left vertebral artery is dominant. Both vertebral arteries are widely patent through the cervical region.  Skeleton: Cervical facet arthritis which could be a cause of neck pain. Mild cervical spondylosis at C6-7 without significant narrowing of the canal or foramina.  Other neck: Thyroid goiter, more extensive on the left than the right, with extension into the superior mediastinum. No lymphadenopathy. No other soft tissue process evident. The patient does have pleural and parenchymal scarring at the lung apices bilaterally.  CTA HEAD  Anterior circulation: Both internal carotid arteries are widely patent through the siphon region without stenosis. The anterior and middle cerebral vessels are patent bilaterally. No abnormality is seen on the left. 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 Diameter of this vessel measures 5 mm. There is a more focal aneurysmal outpouching projecting inferiorly from the M1 segment that measures 3 mm in diameter. Distal to that, the vessel appears normal.  Posterior circulation: Both vertebral arteries are patent through the foramen magnum to the basilar. No basilar stenosis. Posterior circulation branch vessels are normal.  Venous sinuses: Patent and normal.  Anatomic variants: None significant  Delayed phase: No abnormal enhancement  IMPRESSION: No evidence of vascular pathology in the neck to explain right-sided neck pain. No soft tissue lesion identified in the right side of the neck on this study. The patient does have thyroid goiter, but that would not explain the clinical presentation.  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 The significance of this finding in a 74 year old patient without intracranial symptoms is debatable. Nonetheless, referral to a neurovascular specialist as a nonemergent outpatient would be suggested.   Electronically Signed   By: Nelson Chimes M.D.   On: 03/22/2015 17:35   Ct Angio Neck W/cm &/or Wo/cm  03/22/2015   CLINICAL DATA:  Right neck pain radiating to the shoulder region. Acute onset.  EXAM: CT ANGIOGRAPHY HEAD AND NECK  TECHNIQUE: Multidetector CT imaging of the head and neck was performed using the standard protocol during bolus administration of intravenous contrast. Multiplanar CT image reconstructions and MIPs were obtained to evaluate the vascular anatomy. Carotid stenosis measurements (when applicable) are obtained utilizing NASCET criteria, using the distal internal  carotid diameter as the denominator.  CONTRAST:  12mL OMNIPAQUE IOHEXOL 350 MG/ML SOLN  COMPARISON:  MRI head 04/18/2012.  FINDINGS: CT HEAD  The brain shows mild age related atrophy. No evidence of old or acute focal infarction, mass lesion, hemorrhage, hydrocephalus or extra-axial collection. Sinuses, middle ears and mastoids are clear. There is atherosclerotic calcification of the major vessels at the base of the brain.  CTA NECK  Aortic arch: Atherosclerosis of the aorta but no evidence of dissection. Branching pattern of the brachiocephalic vessels from the arch is normal. No origin stenoses.  Right carotid system: Common carotid artery is widely patent to the bifurcation region. Minimal atherosclerotic change at the bifurcation but no stenosis or significant irregularity. Right cervical internal carotid artery is tortuous but otherwise normal.  Left carotid system: Common carotid artery is widely patent to the bifurcation. The bifurcation is normal. The left cervical internal carotid artery is tortuous but widely patent.  Vertebral arteries:No proximal subclavian stenosis. Both vertebral artery origins are widely patent. The left vertebral artery is dominant. Both vertebral arteries are widely patent through the cervical region.  Skeleton: Cervical facet arthritis which could be a cause of neck pain. Mild cervical spondylosis at C6-7 without significant narrowing of the canal or foramina.  Other neck: Thyroid goiter, more extensive on the left than the right, with extension into the superior mediastinum. No lymphadenopathy. No other soft tissue process evident. The patient does have pleural and parenchymal scarring at the lung apices bilaterally.  CTA HEAD  Anterior circulation: Both internal carotid arteries are widely patent through the siphon region without stenosis. The anterior and middle cerebral vessels are patent bilaterally. No abnormality is seen on the left. On the right, there is aneurysmal ectasia  at the carotid bifurcation affecting the M1 segment in the proximal a 1 segment. Diameter of this vessel measures 5 mm. There is a more focal aneurysmal outpouching projecting inferiorly from the M1 segment that measures 3 mm in diameter. Distal to that, the vessel appears normal.  Posterior circulation: Both vertebral arteries are patent through the foramen magnum to the basilar. No basilar stenosis. Posterior circulation branch vessels are normal.  Venous sinuses: Patent and normal.  Anatomic variants: None significant  Delayed phase: No abnormal enhancement  IMPRESSION: No evidence of vascular pathology in the neck to explain right-sided neck pain. No soft tissue lesion identified in the right side of the neck on this study. The patient does have thyroid goiter, but that would not explain the clinical presentation.  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 There is aneurysmal dilatation in that  region with maximal transverse diameter of 5 mm. There is a focal inferiorly projecting aneurysm from that in the M1 segment measuring 3 mm. The significance of this finding in a 74 year old patient without intracranial symptoms is debatable. Nonetheless, referral to a neurovascular specialist as a nonemergent outpatient would be suggested.   Electronically Signed   By: Nelson Chimes M.D.   On: 03/22/2015 17:35    Scheduled Meds: . aspirin EC  81 mg Oral Daily  . cefTRIAXone (ROCEPHIN)  IV  1 g Intravenous Q24H  . diclofenac sodium  2 g Topical QID  . heparin  5,000 Units Subcutaneous 3 times per day  . levothyroxine  75 mcg Oral QAC breakfast  . sertraline  25 mg Oral Daily  . traZODone  50 mg Oral QHS   Continuous Infusions: . sodium chloride 75 mL/hr at 03/24/15 0800    Time spent greater than 50% counselling and discussion: 35 minutes  [facility] [email or web address],  password [facility] 03/24/2015, 1:57 PM  LOS: 2 days

## 2015-03-24 NOTE — Evaluation (Addendum)
Occupational Therapy Evaluation Patient Details Name: Mackenzie Blanchard MRN: [id number] DOB: [birth year] Today's Date: 03/24/2015    History of Present Illness Patient is a 74 y/o female with hx of undiagnosed psychiatric disorders, per daughter, who presents to the ED for confusion and shoulder, neck and head pain. UA was done that shows possible urinary tract infection. PMH includes HLD, cancer, osteopenia.   Clinical Impression   This 74 yo female admitted with above presents to acute OT with decreased balance, decreased strength Bil UEs, decreased mobility all affecting her ability to care for herself at home by herself as she was pta. She will benefit from acute OT with follow up [place] with 24 hour S/prn A      Follow Up Recommendations  HHOT with Supervision/Assistance - 24 hour  Equipment Recommendations   (TBD)       Precautions / Restrictions Precautions Precautions: Fall Restrictions Weight Bearing Restrictions: No      Mobility Bed Mobility Overal bed mobility: Modified Independent Bed Mobility: Supine to Sit;Sit to Supine     Supine to sit: Modified independent (Device/Increase time);HOB elevated Sit to supine: Modified independent (Device/Increase time);HOB elevated    Transfers Overall transfer level: Needs assistance Equipment used:  (IV pole) Transfers: Sit to/from Stand Sit to Stand: Supervision            Balance Overall balance assessment: Needs assistance Sitting-balance support: Feet supported;No upper extremity supported Sitting balance-Leahy Scale: Good Sitting balance - Comments: Assist to donn socks. Able to doff socks with encouragement.   Standing balance support: Single extremity supported Standing balance-Leahy Scale: Fair                              ADL Overall ADL's : Needs assistance/impaired Eating/Feeding: Independent;Sitting   Grooming: Supervision/safety;Sitting;Standing   Upper Body Bathing: Supervision/  safety;Sitting;Standing   Lower Body Bathing: Supervison/ safety;Sit to/from stand   Upper Body Dressing : Supervision/safety;Sitting;Standing   Lower Body Dressing: Supervision/safety;Sit to/from stand   Toilet Transfer: Min guard;Ambulation;Regular Toilet;Grab bars (pushing IV pole)   Toileting- Clothing Manipulation and Hygiene: Supervision/safety;Sit to/from stand         General ADL Comments: Pt talked about how she sometimes props herself up against the wall while standing to get LBD--I recommended that she sit to get LBD.     Vision Additional Comments: No change in baseline          Pertinent Vitals/Pain Pain Assessment: 0-10 Pain Score: 6  Faces Pain Scale: Hurts whole lot Pain Location: neck and right shoulder Pain Descriptors / Indicators: Aching;Sore;Tightness Pain Intervention(s): Repositioned;Heat applied (towel roll)     Hand Dominance Right   Extremity/Trunk Assessment Upper Extremity Assessment Upper Extremity Assessment: RUE deficits/detail RUE Deficits / Details: Can only raise arm ( shoulder flexion) 3/4 range due to pain and tightness, decreased grip strength RUE Coordination: decreased gross motor   Lower Extremity Assessment Lower Extremity Assessment: Generalized weakness   Cervical / Trunk Assessment Cervical / Trunk Assessment: Kyphotic   Communication Communication Communication: No difficulties   Cognition Arousal/Alertness: Awake/alert Behavior During Therapy: Flat affect Overall Cognitive Status: Within Functional Limits for tasks assessed                                Home Living Family/patient expects to be discharged to:: Private residence Living Arrangements: Alone Available Help at Discharge: Family;Available PRN/intermittently Type of  Home: House Home Access: Stairs to enter [employer] of Steps: 2 Entrance Stairs-Rails: Right Home Layout: One level (To put trash can out, needs to descend 7 steps  with HR)         Bathroom Toilet: Standard     Home Equipment: Walker - 2 wheels;Bedside commode          Prior Functioning/Environment Level of Independence: Independent        Comments: Prior to 2 weeks ago, pt gardening, cooking, driving etc. reports neck/shoulder have been getting worse lately resulting in need for ED.    OT Diagnosis: Generalized weakness   OT Problem List: Decreased strength;Decreased range of motion;Decreased knowledge of use of DME or AE;Impaired balance (sitting and/or standing);Impaired UE functional use   OT Treatment/Interventions: Self-care/ADL training;Patient/family education;Balance training;DME and/or AE instruction    OT Goals(Current goals can be found in the care plan section) Acute Rehab OT Goals Patient Stated Goal: to go back home OT Goal Formulation: With patient Time For Goal Achievement: 03/24/15 Potential to Achieve Goals: Good  OT Frequency: Min 2X/week              End of Session Equipment Utilized During Treatment:  (pushing IV  pole)  Activity Tolerance: Patient tolerated treatment well Patient left: in bed;with call bell/phone within reach;with bed alarm set   Time: 1033-1105 OT Time Calculation (min): 32 min Charges:  OT General Charges $OT Visit: 1 Procedure OT Evaluation $Initial OT Evaluation Tier I: 1 Procedure OT Treatments $Self Care/Home Management : 8-22 mins  Almon Register [phone] 03/24/2015, 11:57 AM

## 2015-03-25 ENCOUNTER — Other Ambulatory Visit: Payer: Self-pay | Admitting: Geriatric Medicine

## 2015-03-25 DIAGNOSIS — M542 Cervicalgia: Secondary | ICD-10-CM

## 2015-03-25 MED ORDER — SERTRALINE HCL 25 MG PO TABS: 25.0000 mg | ORAL_TABLET | Freq: Every day | ORAL | Status: DC

## 2015-03-25 MED ORDER — CYCLOBENZAPRINE HCL 5 MG PO TABS: 5.0000 mg | ORAL_TABLET | Freq: Three times a day (TID) | ORAL | Status: DC | PRN

## 2015-03-25 MED ORDER — MELOXICAM 7.5 MG PO TABS: 7.5000 mg | ORAL_TABLET | Freq: Every day | ORAL | Status: DC

## 2015-03-25 MED ORDER — CEFUROXIME AXETIL 500 MG PO TABS: 500.0000 mg | ORAL_TABLET | Freq: Two times a day (BID) | ORAL | Status: DC

## 2015-03-25 MED ORDER — LOPERAMIDE HCL 2 MG PO CAPS: 2.0000 mg | ORAL_CAPSULE | ORAL | Status: DC | PRN

## 2015-03-25 MED ORDER — DICLOFENAC SODIUM 1 % TD GEL: 2.0000 g | Freq: Four times a day (QID) | TRANSDERMAL | Status: DC

## 2015-03-25 NOTE — Progress Notes (Signed)
Patient being discharged home with home health she is alert and oriented, IV removed and discharge summary reviewed.

## 2015-03-25 NOTE — Discharge Summary (Signed)
Physician Discharge Summary  Mackenzie Blanchard OVF:[id number] DOB: 1941/07/31 DOA: 03/22/2015  PCP: Mathews Argyle, MD  Admit date: 03/22/2015 Discharge date: 03/25/2015  Time spent: greater than 30 minutes  Recommendations for Outpatient Follow-up:  1. Home PT, OT arranged. Recommend 24 hour supervision until strength improves  Discharge Diagnoses:  Principal Problem:   UTI (lower urinary tract infection) Active Problems:   Weakness   Acute encephalopathy   Anxiety   Hyponatremia   Muscle spasms of neck deconditioning  Discharge Condition: stable  Diet recommendation: general  Filed Weights   03/22/15 1341 03/22/15 1910  Weight: 61.236 kg (135 lb) 64.501 kg (142 lb 3.2 oz)    History of present illness:  74 y.o. female with past medical history according to her daughter of undiagnosed psychiatric disorders presents to the ED for confusion and shoulder neck and head pain. The daughter relates that she had a surveillance CT scan a couple weeks ago after living in her stomach she started complaining of shoulder and neck pain that has been progressively getting worse. Patient relates she has been complaining of the above symptoms that have aggressively gotten worse. She denies any fever cough chest pain or shortness of breath the patient daughter relates that she has severe anxiety for which she takes a next. The patient has also been complaining of dizziness upon standing, more like headache and some vomiting but nonbloody. She denies any diarrhea. She is complaining of generalized weakness no vision changes.  In the ED: Multiple imaging were done as below a CBC was done that showed no leukocytosis, a UA was done that shows possible urinary tract infections we're consulted for further evaluation.  Hospital Course:   UTI (lower urinary tract infection): started on Rocephin. cx negative, but weakness improved on Rocephin. Transitioned to ceftin at discharge. Reviewed notes from Dr.  Tresa Moore and CT pelvis which was unremarkable. Has outpatient appointment with urology for cystoscopy, arrange PTA for hematuria.    Weakness: worked with PT, OT and will arrange for home. Would benefit from 24 hour supervision until stronger.    Acute encephalopathy secondary to UTI, resolved   Anxiety: extensive conversation with patient and daughter. Suffers from severe anxiety. Daughter thinks many of patients symptoms are anxiety related and she has a somatoform disorder. Patient would like to try low dose SSRI. started lexapro.   Hyponatremia, mild. Not likely contributing to symptoms   Muscle spasms of neck: improved with low dose flexeril, voltaren gel  Procedures:  none  Consultations:  none  Discharge Exam: Filed Vitals:   03/25/15 0953  BP: 132/56  Pulse: 67  Temp: 98.2 F (36.8 C)  Resp: 20    General: a and o, smiling Cardiovascular: RRR without MGR Respiratory: CTA without WRR Neuro: intact  Discharge Instructions   Discharge Instructions    Activity as tolerated - No restrictions    Complete by:  As directed      Diet - low sodium heart healthy    Complete by:  As directed           Current Discharge Medication List    START taking these medications   Details  cefUROXime (CEFTIN) 500 MG tablet Take 1 tablet (500 mg total) by mouth 2 (two) times daily with a meal. Qty: 8 tablet, Refills: 0    cyclobenzaprine (FLEXERIL) 5 MG tablet Take 1 tablet (5 mg total) by mouth 3 (three) times daily as needed for muscle spasms. Qty: 15 tablet, Refills: 0  diclofenac sodium (VOLTAREN) 1 % GEL Apply 2 g topically 4 (four) times daily. To sore area Qty: 100 g, Refills: 0    loperamide (IMODIUM) 2 MG capsule Take 1 capsule (2 mg total) by mouth as needed for diarrhea or loose stools. Qty: 30 capsule, Refills: 0    meloxicam (MOBIC) 7.5 MG tablet Take 1 tablet (7.5 mg total) by mouth daily. Qty: 14 tablet, Refills: 0    sertraline (ZOLOFT) 25 MG  tablet Take 1 tablet (25 mg total) by mouth daily. Qty: 30 tablet, Refills: 1      CONTINUE these medications which have NOT CHANGED   Details  acetaminophen (TYLENOL) 325 MG tablet Take 650 mg by mouth every 6 (six) hours as needed for mild pain or moderate pain.    ALPRAZolam (XANAX) 0.5 MG tablet Take 0.5 mg by mouth 2 (two) times daily as needed for anxiety.    aspirin 81 MG tablet Take 81 mg by mouth daily.    Cholecalciferol (VITAMIN D) 2000 UNITS CAPS Take 1 capsule by mouth daily.     Coenzyme Q10 (CO Q10) 100 MG CAPS Take 2 capsules by mouth daily.    Coenzyme Q10-Fish Oil-Vit E (CO-Q 10 OMEGA-3 FISH OIL PO) Take 200 mg by mouth daily.    diphenhydrAMINE (SOMINEX) 25 MG tablet Take 25 mg by mouth at bedtime as needed.    furosemide (LASIX) 20 MG tablet Take 20 mg by mouth daily as needed for fluid.     levothyroxine (SYNTHROID, LEVOTHROID) 75 MCG tablet Take 75 mcg by mouth daily.    Lutein 20 MG CAPS Take 1 capsule by mouth daily.    Lycopene 10 MG CAPS Take 1 capsule by mouth daily.    Magnesium 400 MG CAPS Take 1 tablet by mouth 3 (three) times daily. 1200mg  daily    magnesium aspartate (MAGINEX) 615 MG tablet Take 615 mg by mouth daily.    Melatonin 5 MG CAPS Take 1 capsule by mouth at bedtime.     Multiple Vitamin (MULTIVITAMIN) tablet Take 1 tablet by mouth daily.    Potassium 75 MG TABS Take by mouth. Taking around 2000mg  daily    traZODone (DESYREL) 50 MG tablet Take 50 mg by mouth at bedtime.    hydrochlorothiazide (HYDRODIURIL) 25 MG tablet Take 25 mg by mouth daily.      STOP taking these medications     HYDROmorphone (DILAUDID) 2 MG tablet      traMADol (ULTRAM) 50 MG tablet        Allergies  Allergen Reactions  . Codeine   . Penicillins    Follow-up Information    Follow up with Mathews Argyle, MD.   Specialty:  Internal Medicine   Contact information:   [address] [place] [address] [phone]         The results of significant diagnostics from this hospitalization (including imaging, microbiology, ancillary and laboratory) are listed below for reference.    Significant Diagnostic Studies: Ct Angio Head W/cm &/or Wo Cm  03/22/2015   CLINICAL DATA:  Right neck pain radiating to the shoulder region. Acute onset.  EXAM: CT ANGIOGRAPHY HEAD AND NECK  TECHNIQUE: Multidetector CT imaging of the head and neck was performed using the standard protocol during bolus administration of intravenous contrast. Multiplanar CT image reconstructions and MIPs were obtained to evaluate the vascular anatomy. Carotid stenosis measurements (when applicable) are obtained utilizing NASCET criteria, using the distal internal carotid diameter as the denominator.  CONTRAST:  2mL OMNIPAQUE IOHEXOL 350 MG/ML SOLN  COMPARISON:  MRI head 04/18/2012.  FINDINGS: CT HEAD  The brain shows mild age related atrophy. No evidence of old or acute focal infarction, mass lesion, hemorrhage, hydrocephalus or extra-axial collection. Sinuses, middle ears and mastoids are clear. There is atherosclerotic calcification of the major vessels at the base of the brain.  CTA NECK  Aortic arch: Atherosclerosis of the aorta but no evidence of dissection. Branching pattern of the brachiocephalic vessels from the arch is normal. No origin stenoses.  Right carotid system: Common carotid artery is widely patent to the bifurcation region. Minimal atherosclerotic change at the bifurcation but no stenosis or significant irregularity. Right cervical internal carotid artery is tortuous but otherwise normal.  Left carotid system: Common carotid artery is widely patent to the bifurcation. The bifurcation is normal. The left cervical internal carotid artery is tortuous but widely patent.  Vertebral arteries:No proximal subclavian stenosis. Both vertebral artery origins are widely patent. The left vertebral artery is dominant. Both vertebral arteries are widely patent  through the cervical region.  Skeleton: Cervical facet arthritis which could be a cause of neck pain. Mild cervical spondylosis at C6-7 without significant narrowing of the canal or foramina.  Other neck: Thyroid goiter, more extensive on the left than the right, with extension into the superior mediastinum. No lymphadenopathy. No other soft tissue process evident. The patient does have pleural and parenchymal scarring at the lung apices bilaterally.  CTA HEAD  Anterior circulation: Both internal carotid arteries are widely patent through the siphon region without stenosis. The anterior and middle cerebral vessels are patent bilaterally. No abnormality is seen on the left. 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 Diameter of this vessel measures 5 mm. There is a more focal aneurysmal outpouching projecting inferiorly from the M1 segment that measures 3 mm in diameter. Distal to that, the vessel appears normal.  Posterior circulation: Both vertebral arteries are patent through the foramen magnum to the basilar. No basilar stenosis. Posterior circulation branch vessels are normal.  Venous sinuses: Patent and normal.  Anatomic variants: None significant  Delayed phase: No abnormal enhancement  IMPRESSION: No evidence of vascular pathology in the neck to explain right-sided neck pain. No soft tissue lesion identified in the right side of the neck on this study. The patient does have thyroid goiter, but that would not explain the clinical presentation.  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 The significance of this finding in a 74 year old patient without  intracranial symptoms is debatable. Nonetheless, referral to a neurovascular specialist as a nonemergent outpatient would be suggested.   Electronically Signed   By: Nelson Chimes M.D.   On: 03/22/2015 17:35   Dg Cervical Spine 2 Or 3 Views  03/17/2015   CLINICAL DATA:  Right side neck and shoulder pain for 1 week.  EXAM: CERVICAL SPINE - 2-3 VIEW  COMPARISON:  None.  FINDINGS: Degenerative disc disease changes at C6-7 with disc space narrowing and spurring. Degenerative facet disease diffusely bilaterally. Normal alignment. No fracture. Prevertebral soft tissues are normal.  IMPRESSION: Degenerative disc and facet disease as  above. No acute bony abnormality.   Electronically Signed   By: Rolm Baptise M.D.   On: 03/17/2015 15:35   Ct Angio Neck W/cm &/or Wo/cm  03/22/2015   CLINICAL DATA:  Right neck pain radiating to the shoulder region. Acute onset.  EXAM: CT ANGIOGRAPHY HEAD AND NECK  TECHNIQUE: Multidetector CT imaging of the head and neck was performed using the standard protocol during bolus administration of intravenous contrast. Multiplanar CT image reconstructions and MIPs were obtained to evaluate the vascular anatomy. Carotid stenosis measurements (when applicable) are obtained utilizing NASCET criteria, using the distal internal carotid diameter as the denominator.  CONTRAST:  70mL OMNIPAQUE IOHEXOL 350 MG/ML SOLN  COMPARISON:  MRI head 04/18/2012.  FINDINGS: CT HEAD  The brain shows mild age related atrophy. No evidence of old or acute focal infarction, mass lesion, hemorrhage, hydrocephalus or extra-axial collection. Sinuses, middle ears and mastoids are clear. There is atherosclerotic calcification of the major vessels at the base of the brain.  CTA NECK  Aortic arch: Atherosclerosis of the aorta but no evidence of dissection. Branching pattern of the brachiocephalic vessels from the arch is normal. No origin stenoses.  Right carotid system: Common carotid artery is widely patent to the  bifurcation region. Minimal atherosclerotic change at the bifurcation but no stenosis or significant irregularity. Right cervical internal carotid artery is tortuous but otherwise normal.  Left carotid system: Common carotid artery is widely patent to the bifurcation. The bifurcation is normal. The left cervical internal carotid artery is tortuous but widely patent.  Vertebral arteries:No proximal subclavian stenosis. Both vertebral artery origins are widely patent. The left vertebral artery is dominant. Both vertebral arteries are widely patent through the cervical region.  Skeleton: Cervical facet arthritis which could be a cause of neck pain. Mild cervical spondylosis at C6-7 without significant narrowing of the canal or foramina.  Other neck: Thyroid goiter, more extensive on the left than the right, with extension into the superior mediastinum. No lymphadenopathy. No other soft tissue process evident. The patient does have pleural and parenchymal scarring at the lung apices bilaterally.  CTA HEAD  Anterior circulation: Both internal carotid arteries are widely patent through the siphon region without stenosis. The anterior and middle cerebral vessels are patent bilaterally. No abnormality is seen on the left. 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 Diameter of this vessel measures 5 mm. There is a more focal aneurysmal outpouching projecting inferiorly from the M1 segment that measures 3 mm in diameter. Distal to that, the vessel appears normal.  Posterior circulation: Both vertebral arteries are patent through the foramen magnum to the basilar. No basilar stenosis. Posterior circulation branch vessels are normal.  Venous sinuses: Patent and normal.  Anatomic variants: None significant  Delayed phase: No abnormal enhancement  IMPRESSION: No evidence of vascular pathology in the neck to explain right-sided neck pain. No soft  tissue lesion identified in the right side of the neck on this study. The patient does have thyroid goiter, but that would not explain the clinical presentation.  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 The significance of this finding in a 73 year  old patient without intracranial symptoms is debatable. Nonetheless, referral to a neurovascular specialist as a nonemergent outpatient would be suggested.   Electronically Signed   By: Nelson Chimes M.D.   On: 03/22/2015 17:35    Microbiology: Recent Results (from the past 240 hour(s))  Culture, blood (routine x 2)     Status: None (Preliminary result)   Collection Time: 03/22/15  9:26 PM  Result Value Ref Range Status   Specimen Description BLOOD LEFT ANTECUBITAL  Final   Special Requests BOTTLES DRAWN AEROBIC AND ANAEROBIC 5CC   Final   Culture NO GROWTH 2 DAYS  Final   Report Status PENDING  Incomplete  Culture, blood (routine x 2)     Status: None (Preliminary result)   Collection Time: 03/22/15  9:32 PM  Result Value Ref Range Status   Specimen Description BLOOD LEFT HAND  Final   Special Requests BOTTLES DRAWN AEROBIC AND ANAEROBIC 5CC   Final   Culture NO GROWTH 2 DAYS  Final   Report Status PENDING  Incomplete  Culture, Urine     Status: None   Collection Time: 03/23/15  3:02 PM  Result Value Ref Range Status   Specimen Description URINE, RANDOM  Final   Special Requests NONE  Final   Culture NO GROWTH 1 DAY  Final   Report Status 03/24/2015 FINAL  Final     Labs: Basic Metabolic Panel:  Recent Labs Lab 03/22/15 1357 03/23/15 0705  NA 133* 132*  K 4.0 4.0  CL 96* 100*  CO2 27 24  GLUCOSE 113* 101*  BUN 15 16  CREATININE 0.83 0.53  CALCIUM  9.6 8.7*   Liver Function Tests: No results for input(s): AST, ALT, ALKPHOS, BILITOT, PROT, ALBUMIN in the last 168 hours. No results for input(s): LIPASE, AMYLASE in the last 168 hours. No results for input(s): AMMONIA in the last 168 hours. CBC:  Recent Labs Lab 03/22/15 1357 03/23/15 0705  WBC 5.6 4.4  HGB 15.1* 12.8  HCT 44.0 38.2  MCV 90.9 91.2  PLT 268 262   Cardiac Enzymes: No results for input(s): CKTOTAL, CKMB, CKMBINDEX, TROPONINI in the last 168 hours. BNP: BNP (last 3 results) No results for input(s): BNP in the last 8760 hours.  ProBNP (last 3 results) No results for input(s): PROBNP in the last 8760 hours.  CBG: No results for input(s): GLUCAP in the last 168 hours.     SignedDelfina Redwood  [facility] 03/25/2015, 10:32 AM

## 2015-03-25 NOTE — Progress Notes (Signed)
Occupational Therapy Treatment Patient Details Name: Mackenzie Blanchard MRN: [id number] DOB: November 24, 1940 Today's Date: 03/25/2015    History of present illness Patient is a 74 y/o female with hx of undiagnosed psychiatric disorders, per daughter, who presents to the ED for confusion and shoulder, neck and head pain. UA was done that shows possible urinary tract infection. PMH includes HLD, cancer, osteopenia.   OT comments  Pt progressing towards acute OT goals. Focus of session was HEP for right grip strength and fine motor coordination. Also discussed fall prevention and home setup with pt and daughter. D/c plan updated to [place].  Follow Up Recommendations  Supervision/Assistance - 24 hour;Home health OT    Equipment Recommendations       Recommendations for Other Services      Precautions / Restrictions Precautions Precautions: Fall Restrictions Weight Bearing Restrictions: No       Mobility Bed Mobility Overal bed mobility: Modified Independent Bed Mobility: Supine to Sit;Sit to Supine     Supine to sit: Modified independent (Device/Increase time) Sit to supine: Modified independent (Device/Increase time)   General bed mobility comments: HOB flat, no use of rails for support to simulate home.  Transfers Overall transfer level: Needs assistance Equipment used: Rolling walker (2 wheeled) Transfers: Sit to/from Stand Sit to Stand: Supervision         General transfer comment: Supervision for safety.     Balance Overall balance assessment: Needs assistance Sitting-balance support: Feet supported;No upper extremity supported Sitting balance-Leahy Scale: Good     Standing balance support: Bilateral upper extremity supported;During functional activity Standing balance-Leahy Scale: Fair                     ADL Overall ADL's : Needs assistance/impaired     Grooming: Wash/dry hands;Supervision/safety;Standing Grooming Details (indicate cue type and reason):  educated on keeping rw in front and not setting it to the side                  Toilet Transfer: Min guard;Ambulation;Regular Toilet;Grab bars;RW [employer] forces technical [employer] Details (indicate cue type and reason): Min guard from lower seat surface. Pt has elevated toilet and sturdy support surfaces to assist with toilet transfer at home. Discussed technique.          Functional mobility during ADLs: Rolling walker;Supervision/safety General ADL Comments: Pt noted to set rw to the side prior to toitlet transfer and grooming at sink. Educated ot and daughter on keeping rw in front of her at all times when [place]. Issued super soft theraputty with HEP for right grip strength and fine motor coordination. Also provided handout on fine motor coordination activities. Discussed home setup and fall prevention.      Vision                     Perception     Praxis      Cognition   Behavior During Therapy: WFL for tasks assessed/performed Overall Cognitive Status: Within Functional Limits for tasks assessed                       Extremity/Trunk Assessment               Exercises Other Exercises Other Exercises: Issued super soft theraputty with HEP and pt providing return demonstration with cues provided as needed. Also, provided handout of fine motor coordination activites.    Shoulder Instructions       General Comments  Pertinent Vitals/ Pain       Pain Assessment: Faces Faces Pain Scale: Hurts even more Pain Location: neck and right shoulder at about 100 degrees shoulder flexion Pain Descriptors / Indicators: Aching;Sore;Tightness Pain Intervention(s): Limited activity within patient's tolerance;Monitored during session;Repositioned  Home Living                                          Prior Functioning/Environment              Frequency Min 2X/week     Progress Toward Goals  OT Goals(current goals can now be found in the care  plan section)  Progress towards OT goals: Progressing toward goals  Acute Rehab OT Goals Patient Stated Goal: to go back home OT Goal Formulation: With patient Time For Goal Achievement: 03/24/15 Potential to Achieve Goals: Good ADL Goals Pt Will Perform Grooming: with supervision;standing Pt Will Perform Upper Body Bathing: with modified independence;standing Pt Will Perform Lower Body Bathing: with modified independence;sit to/from stand Pt Will Perform Upper Body Dressing: with modified independence;standing;sitting Pt Will Perform Lower Body Dressing: with modified independence;sit to/from stand Pt Will Transfer to Toilet: with modified independence;ambulating;bedside commode Pt Will Perform Toileting - Clothing Manipulation and hygiene: with modified independence;sit to/from stand Pt Will Perform Tub/Shower Transfer: with supervision;ambulating;rolling walker Pt/caregiver will Perform Home Exercise Program: Right Upper extremity;With theraputty;With Supervision;With written HEP provided  Plan Discharge plan needs to be updated    Co-evaluation                 End of Session Equipment Utilized During Treatment: Rolling walker   Activity Tolerance Patient tolerated treatment well   Patient Left in bed;with call bell/phone within reach;with bed alarm set;with family/visitor present   Nurse Communication          Time: 0347-4259 OT Time Calculation (min): 23 min  Charges: OT General Charges $OT Visit: 1 Procedure OT Treatments $Self Care/Home Management : 8-22 mins $Therapeutic Exercise: 8-22 mins  Hortencia Pilar 03/25/2015, 11:49 AM

## 2015-03-25 NOTE — Progress Notes (Signed)
Physical Therapy Treatment Patient Details Name: Mackenzie Blanchard MRN: [id number] DOB: April 30, 1941 Today's Date: 03/25/2015    History of Present Illness Patient is a 74 y/o female with hx of undiagnosed psychiatric disorders, per daughter, who presents to the ED for confusion and shoulder, neck and head pain. UA was done that shows possible urinary tract infection. PMH includes HLD, cancer, osteopenia.    PT Comments    Patient progressing well towards PT goals. Improved ambulation distance today with less assist. Pt more lively and happy today after a good nights sleep. Encouraged use of RW at d/c for safety until strength/mobility improves. Performed stair training today. Pt only needs to navigate stairs to take out trash so recommend pt has daughter assist with taking out trash initially. Pt agreeable. Safe to discharge home with initial 24/7 S and HHPT. Will follow acutely.   Follow Up Recommendations  Home health PT;Supervision/Assistance - 24 hour     Equipment Recommendations  None recommended by PT    Recommendations for Other Services       Precautions / Restrictions Precautions Precautions: Fall Restrictions Weight Bearing Restrictions: No    Mobility  Bed Mobility Overal bed mobility: Modified Independent Bed Mobility: Supine to Sit;Sit to Supine     Supine to sit: Modified independent (Device/Increase time) Sit to supine: Modified independent (Device/Increase time)   General bed mobility comments: HOB flat, no use of rails for support to simulate home.  Transfers Overall transfer level: Needs assistance Equipment used: Rolling walker (2 wheeled) Transfers: Sit to/from Stand Sit to Stand: Supervision         General transfer comment: Supervision for safety.   Ambulation/Gait Ambulation/Gait assistance: Supervision Ambulation Distance (Feet): 150 Feet Assistive device: Rolling walker (2 wheeled) Gait Pattern/deviations: Step-through pattern;Decreased  stride length;Trunk flexed   Gait velocity interpretation: <1.8 ft/sec, indicative of risk for recurrent falls General Gait Details: Pt with slow, steady gait. No LOB. Ambulated within room without RW but furniture walking for support.    Stairs Stairs: Yes Stairs assistance: Min guard Stair Management: One rail Right;Step to pattern Number of Stairs: 7 General stair comments: Cues for technique. Recommended pt not take out trash for the next few weeks at home. Pt agreeable.  Wheelchair Mobility    Modified Rankin (Stroke Patients Only)       Balance Overall balance assessment: Needs assistance Sitting-balance support: Feet supported;No upper extremity supported Sitting balance-Leahy Scale: Good     Standing balance support: During functional activity Standing balance-Leahy Scale: Fair                      Cognition Arousal/Alertness: Awake/alert Behavior During Therapy: WFL for tasks assessed/performed Overall Cognitive Status: Within Functional Limits for tasks assessed                      Exercises      General Comments        Pertinent Vitals/Pain Pain Assessment: No/denies pain    Home Living                      Prior Function            PT Goals (current goals can now be found in the care plan section) Progress towards PT goals: Progressing toward goals    Frequency  Min 3X/week    PT Plan Current plan remains appropriate    Co-evaluation  End of Session Equipment Utilized During Treatment: Gait belt Activity Tolerance: Patient tolerated treatment well Patient left: in chair;with call bell/phone within reach;with nursing/sitter in room     Time: 0917-0935 PT Time Calculation (min) (ACUTE ONLY): 18 min  Charges:  $Gait Training: 8-22 mins                    G Codes:      [place] 03/25/2015, 10:00 AM  Wray Kearns, [place], DPT [phone]

## 2015-03-25 NOTE — Progress Notes (Signed)
Responded to spiritual Care consult to assist patient with completion of advance directive.  Document complete  And copies giving to patient and nurse. Patient being discharged.   03/25/15 1300  Clinical Encounter Type  Visited With Patient;Patient and family together;Health care provider  Visit Type Initial;Spiritual support (Assist withAdvance Directive)  Referral From Nurse  Spiritual Encounters  Spiritual Needs Literature;Emotional  Stress Factors  Patient Stress Factors None identified  Family Stress Factors None identified  Advance Directives (For Healthcare)  Does patient have an advance directive? Yes  Jaclynn Major, [place] pager [phone]

## 2015-03-27 LAB — CULTURE, BLOOD (ROUTINE X 2)
CULTURE: NO GROWTH
Culture: NO GROWTH

## 2015-09-13 DIAGNOSIS — M5030 Other cervical disc degeneration, unspecified cervical region: Secondary | ICD-10-CM | POA: Diagnosis not present

## 2015-09-13 DIAGNOSIS — M5412 Radiculopathy, cervical region: Secondary | ICD-10-CM | POA: Diagnosis not present

## 2015-09-13 DIAGNOSIS — M542 Cervicalgia: Secondary | ICD-10-CM | POA: Diagnosis not present

## 2015-09-22 DIAGNOSIS — M5412 Radiculopathy, cervical region: Secondary | ICD-10-CM | POA: Diagnosis not present

## 2015-10-01 DIAGNOSIS — M4802 Spinal stenosis, cervical region: Secondary | ICD-10-CM | POA: Diagnosis not present

## 2015-10-01 DIAGNOSIS — M4642 Discitis, unspecified, cervical region: Secondary | ICD-10-CM | POA: Diagnosis not present

## 2015-10-01 DIAGNOSIS — M5412 Radiculopathy, cervical region: Secondary | ICD-10-CM | POA: Diagnosis not present

## 2015-10-01 DIAGNOSIS — M5031 Other cervical disc degeneration,  high cervical region: Secondary | ICD-10-CM | POA: Diagnosis not present

## 2015-10-13 DIAGNOSIS — N390 Urinary tract infection, site not specified: Secondary | ICD-10-CM | POA: Diagnosis not present

## 2015-10-15 DIAGNOSIS — M5412 Radiculopathy, cervical region: Secondary | ICD-10-CM | POA: Diagnosis not present

## 2015-10-29 DIAGNOSIS — M5412 Radiculopathy, cervical region: Secondary | ICD-10-CM | POA: Diagnosis not present

## 2015-10-30 DIAGNOSIS — R634 Abnormal weight loss: Secondary | ICD-10-CM | POA: Diagnosis not present

## 2015-10-30 DIAGNOSIS — F419 Anxiety disorder, unspecified: Secondary | ICD-10-CM | POA: Diagnosis not present

## 2015-10-30 DIAGNOSIS — I872 Venous insufficiency (chronic) (peripheral): Secondary | ICD-10-CM | POA: Diagnosis not present

## 2015-10-31 DIAGNOSIS — M5412 Radiculopathy, cervical region: Secondary | ICD-10-CM | POA: Diagnosis not present

## 2015-11-04 DIAGNOSIS — M5412 Radiculopathy, cervical region: Secondary | ICD-10-CM | POA: Diagnosis not present

## 2015-11-06 DIAGNOSIS — M5031 Other cervical disc degeneration,  high cervical region: Secondary | ICD-10-CM | POA: Diagnosis not present

## 2015-11-06 DIAGNOSIS — M4802 Spinal stenosis, cervical region: Secondary | ICD-10-CM | POA: Diagnosis not present

## 2015-11-06 DIAGNOSIS — M542 Cervicalgia: Secondary | ICD-10-CM | POA: Diagnosis not present

## 2015-11-06 DIAGNOSIS — M50321 Other cervical disc degeneration at C4-C5 level: Secondary | ICD-10-CM | POA: Diagnosis not present

## 2015-11-07 DIAGNOSIS — M5412 Radiculopathy, cervical region: Secondary | ICD-10-CM | POA: Diagnosis not present

## 2015-11-11 DIAGNOSIS — M5412 Radiculopathy, cervical region: Secondary | ICD-10-CM | POA: Diagnosis not present

## 2015-11-13 DIAGNOSIS — M5412 Radiculopathy, cervical region: Secondary | ICD-10-CM | POA: Diagnosis not present

## 2015-11-17 ENCOUNTER — Emergency Department (HOSPITAL_COMMUNITY): Payer: Medicare Other

## 2015-11-17 ENCOUNTER — Encounter (HOSPITAL_COMMUNITY): Payer: Self-pay

## 2015-11-17 ENCOUNTER — Inpatient Hospital Stay (HOSPITAL_COMMUNITY)
Admission: EM | Admit: 2015-11-17 | Discharge: 2015-11-21 | DRG: 200 | Disposition: A | Discharge status: Skilled Nursing Facility | Payer: Medicare Other | Attending: General Surgery | Admitting: General Surgery

## 2015-11-17 DIAGNOSIS — Z880 Allergy status to penicillin: Secondary | ICD-10-CM

## 2015-11-17 DIAGNOSIS — Z9689 Presence of other specified functional implants: Secondary | ICD-10-CM

## 2015-11-17 DIAGNOSIS — S2241XA Multiple fractures of ribs, right side, initial encounter for closed fracture: Secondary | ICD-10-CM | POA: Diagnosis present

## 2015-11-17 DIAGNOSIS — S272XXA Traumatic hemopneumothorax, initial encounter: Secondary | ICD-10-CM | POA: Diagnosis not present

## 2015-11-17 DIAGNOSIS — F329 Major depressive disorder, single episode, unspecified: Secondary | ICD-10-CM | POA: Diagnosis present

## 2015-11-17 DIAGNOSIS — M79672 Pain in left foot: Secondary | ICD-10-CM

## 2015-11-17 DIAGNOSIS — E785 Hyperlipidemia, unspecified: Secondary | ICD-10-CM | POA: Diagnosis present

## 2015-11-17 DIAGNOSIS — S20211A Contusion of right front wall of thorax, initial encounter: Secondary | ICD-10-CM | POA: Diagnosis not present

## 2015-11-17 DIAGNOSIS — S2249XA Multiple fractures of ribs, unspecified side, initial encounter for closed fracture: Secondary | ICD-10-CM | POA: Diagnosis not present

## 2015-11-17 DIAGNOSIS — M546 Pain in thoracic spine: Secondary | ICD-10-CM | POA: Diagnosis not present

## 2015-11-17 DIAGNOSIS — W01198A Fall on same level from slipping, tripping and stumbling with subsequent striking against other object, initial encounter: Secondary | ICD-10-CM | POA: Diagnosis present

## 2015-11-17 DIAGNOSIS — J939 Pneumothorax, unspecified: Secondary | ICD-10-CM | POA: Diagnosis not present

## 2015-11-17 DIAGNOSIS — Z79899 Other long term (current) drug therapy: Secondary | ICD-10-CM | POA: Diagnosis not present

## 2015-11-17 DIAGNOSIS — Z7982 Long term (current) use of aspirin: Secondary | ICD-10-CM

## 2015-11-17 DIAGNOSIS — S134XXA Sprain of ligaments of cervical spine, initial encounter: Secondary | ICD-10-CM | POA: Diagnosis not present

## 2015-11-17 DIAGNOSIS — W19XXXA Unspecified fall, initial encounter: Secondary | ICD-10-CM | POA: Diagnosis present

## 2015-11-17 DIAGNOSIS — F419 Anxiety disorder, unspecified: Secondary | ICD-10-CM | POA: Diagnosis not present

## 2015-11-17 DIAGNOSIS — M1288 Other specific arthropathies, not elsewhere classified, other specified site: Secondary | ICD-10-CM | POA: Diagnosis not present

## 2015-11-17 DIAGNOSIS — S2231XA Fracture of one rib, right side, initial encounter for closed fracture: Secondary | ICD-10-CM | POA: Diagnosis not present

## 2015-11-17 DIAGNOSIS — Z886 Allergy status to analgesic agent status: Secondary | ICD-10-CM | POA: Diagnosis not present

## 2015-11-17 DIAGNOSIS — E039 Hypothyroidism, unspecified: Secondary | ICD-10-CM | POA: Diagnosis not present

## 2015-11-17 DIAGNOSIS — S270XXA Traumatic pneumothorax, initial encounter: Secondary | ICD-10-CM | POA: Diagnosis not present

## 2015-11-17 DIAGNOSIS — W182XXA Fall in (into) shower or empty bathtub, initial encounter: Secondary | ICD-10-CM | POA: Diagnosis not present

## 2015-11-17 DIAGNOSIS — Y92002 Bathroom of unspecified non-institutional (private) residence single-family (private) house as the place of occurrence of the external cause: Secondary | ICD-10-CM

## 2015-11-17 DIAGNOSIS — R0602 Shortness of breath: Secondary | ICD-10-CM | POA: Diagnosis not present

## 2015-11-17 DIAGNOSIS — M5412 Radiculopathy, cervical region: Secondary | ICD-10-CM | POA: Diagnosis not present

## 2015-11-17 HISTORY — DX: Unspecified osteoarthritis, unspecified site: M19.90

## 2015-11-17 HISTORY — DX: Pain in right shoulder: M25.511

## 2015-11-17 HISTORY — DX: Malignant neoplasm of colon, unspecified: C18.9

## 2015-11-17 HISTORY — DX: Unspecified place in unspecified non-institutional (private) residence as the place of occurrence of the external cause: Y92.009

## 2015-11-17 HISTORY — DX: Anxiety disorder, unspecified: F41.9

## 2015-11-17 HISTORY — DX: Other chronic pain: G89.29

## 2015-11-17 HISTORY — DX: Autoimmune thyroiditis: E06.3

## 2015-11-17 HISTORY — DX: Major depressive disorder, single episode, unspecified: F32.9

## 2015-11-17 HISTORY — DX: Depression, unspecified: F32.A

## 2015-11-17 HISTORY — DX: Unspecified fall, initial encounter: W19.XXXA

## 2015-11-17 LAB — CBC
HEMATOCRIT: 39.3 % (ref 36.0–46.0)
Hemoglobin: 13.1 g/dL (ref 12.0–15.0)
MCH: 30.7 pg (ref 26.0–34.0)
MCHC: 33.3 g/dL (ref 30.0–36.0)
MCV: 92 fL (ref 78.0–100.0)
Platelets: 232 10*3/uL (ref 150–400)
RBC: 4.27 MIL/uL (ref 3.87–5.11)
RDW: 12.8 % (ref 11.5–15.5)
WBC: 7.7 10*3/uL (ref 4.0–10.5)

## 2015-11-17 LAB — BASIC METABOLIC PANEL
Anion gap: 9 (ref 5–15)
BUN: 12 mg/dL (ref 6–20)
CHLORIDE: 100 mmol/L — AB (ref 101–111)
CO2: 26 mmol/L (ref 22–32)
Calcium: 10.1 mg/dL (ref 8.9–10.3)
Creatinine, Ser: 0.56 mg/dL (ref 0.44–1.00)
GFR calc Af Amer: >60 mL/min (ref >60)
GFR calc non Af Amer: >60 mL/min (ref >60)
GLUCOSE: 103 mg/dL — AB (ref 65–99)
Potassium: 3.6 mmol/L (ref 3.5–5.1)
Sodium: 135 mmol/L (ref 135–145)

## 2015-11-17 MED ORDER — MORPHINE SULFATE (PF) 4 MG/ML IV SOLN
4.0000 mg | INTRAVENOUS | Status: DC | PRN
  Administered 2015-11-17 – 2015-11-18 (×2): 4 mg via INTRAVENOUS
  Filled 2015-11-17 (×2): qty 1

## 2015-11-17 MED ORDER — LORAZEPAM 2 MG/ML IJ SOLN
0.5000 mg | Freq: Once | INTRAMUSCULAR | Status: AC
  Administered 2015-11-17: 0.5 mg via INTRAVENOUS
  Filled 2015-11-17: qty 1

## 2015-11-17 NOTE — ED Notes (Signed)
Pt states she fell onto bathtub, breaking ribs. Pt has xray from [facility], shows right 4-6 rib fx and moderate right side pneumothorax. Pt complaing of sob. Able to speak in complete sentences.

## 2015-11-17 NOTE — ED Provider Notes (Signed)
CSN: [id number]     Arrival date & time 11/17/15  2253 History  By signing my name below, I, Helane Gunther, attest that this documentation has been prepared under the direction and in the presence of Jola Schmidt, MD. Electronically Signed: Helane Gunther, ED Scribe. 11/18/2015. 12:14 AM.    Chief Complaint  Patient presents with  . Shortness of Breath  . Rib Fracture   The history is provided by the patient and a relative. No language interpreter was used.   HPI Comments: Mackenzie Blanchard is a 75 y.o. female who presents to the Emergency Department complaining of a painful right-sided rib fracture and associated SOB onset this morning after a fall. Pt states she had just gone to the bathroom when she lost her balance and fell onto her bathtub, landing on her right ribcage and right shoulder. She notes she struck her head, but did not have LOC. She reports associated pain to the right ribcage and in her right shoulder. She was seen earlier today at the orthopedist, who did not examine her for the rib pain. Per daughter, they went to the [facility] afterwards, where pt had an XR done, and was diagnosed with right-sided fractures of ribs 4-6, in addition to right-sided pneumothorax. Pt notes a PMHx of balance issues. Per daughter, pt has not had a fall in quite some time. She notes pt has a PMHx of recurrent neck pain, but notes no new pain there since the fall. Per daughter, pt ha snot been acting unusual and has been at baseline. She reports pt is typically a little anxious at baseline, and occasionally takes trazodone to help her sleep. She notes pt is typically very active and moving about at home.   Past Medical History  Diagnosis Date  . Hypothyroidism   . Edema     idiopathic  . Sinusitis, chronic   . Hyperlipidemia   . Osteopenia   . Cancer [facility])    Past Surgical History  Procedure Laterality Date  . Cataract extraction      bilaterally  . Tubal ligation    .  Colonoscopy with propofol N/A 11/16/2012    Procedure: COLONOSCOPY WITH PROPOFOL;  Surgeon: Garlan Fair, MD;  Location: [facility];  Service: Endoscopy;  Laterality: N/A;  h&p in file-[facility]  . Colectomy  05/2013   Family History  Problem Relation Age of Onset  . Lung cancer Mother   . Hypertension Father   . Heart disease Father   . Cancer - Other Brother   . Diabetes Brother    Social History  Substance Use Topics  . Smoking status: Never Smoker   . Smokeless tobacco: Never Used  . Alcohol Use: No   OB History    No data available     Review of Systems A complete 10 system review of systems was obtained and all systems are negative except as noted in the HPI and PMH.   Allergies  Codeine and Penicillins  Home Medications   Prior to Admission medications   Medication Sig Start Date End Date Taking? Authorizing Provider  acetaminophen (TYLENOL) 325 MG tablet Take 650 mg by mouth every 6 (six) hours as needed for mild pain or moderate pain.    Historical Provider, MD  ALPRAZolam Duanne Moron) 0.5 MG tablet Take 0.5 mg by mouth 2 (two) times daily as needed for anxiety.    Historical Provider, MD  aspirin 81 MG tablet Take 81 mg by mouth daily.  Historical Provider, MD  cefUROXime (CEFTIN) 500 MG tablet Take 1 tablet (500 mg total) by mouth 2 (two) times daily with a meal. 03/25/15   Delfina Redwood, MD  Cholecalciferol (VITAMIN D) 2000 UNITS CAPS Take 1 capsule by mouth daily.     Historical Provider, MD  Coenzyme Q10 (CO Q10) 100 MG CAPS Take 2 capsules by mouth daily.    Historical Provider, MD  Coenzyme Q10-Fish Oil-Vit E (CO-Q 10 OMEGA-3 FISH OIL PO) Take 200 mg by mouth daily.    Historical Provider, MD  cyclobenzaprine (FLEXERIL) 5 MG tablet Take 1 tablet (5 mg total) by mouth 3 (three) times daily as needed for muscle spasms. 03/25/15   Delfina Redwood, MD  diclofenac sodium (VOLTAREN) 1 % GEL Apply 2 g topically 4 (four) times daily. To sore area 03/25/15   Delfina Redwood, MD  diphenhydrAMINE (SOMINEX) 25 MG tablet Take 25 mg by mouth at bedtime as needed.    Historical Provider, MD  furosemide (LASIX) 20 MG tablet Take 20 mg by mouth daily as needed for fluid.     Historical Provider, MD  hydrochlorothiazide (HYDRODIURIL) 25 MG tablet Take 25 mg by mouth daily.    Historical Provider, MD  levothyroxine (SYNTHROID, LEVOTHROID) 75 MCG tablet Take 75 mcg by mouth daily.    Historical Provider, MD  loperamide (IMODIUM) 2 MG capsule Take 1 capsule (2 mg total) by mouth as needed for diarrhea or loose stools. 03/25/15   Delfina Redwood, MD  Lutein 20 MG CAPS Take 1 capsule by mouth daily.    Historical Provider, MD  Lycopene 10 MG CAPS Take 1 capsule by mouth daily.    Historical Provider, MD  Magnesium 400 MG CAPS Take 1 tablet by mouth 3 (three) times daily. 1200mg  daily    Historical Provider, MD  magnesium aspartate (MAGINEX) 615 MG tablet Take 615 mg by mouth daily.    Historical Provider, MD  Melatonin 5 MG CAPS Take 1 capsule by mouth at bedtime.     Historical Provider, MD  meloxicam (MOBIC) 7.5 MG tablet Take 1 tablet (7.5 mg total) by mouth daily. 03/25/15   Delfina Redwood, MD  Multiple Vitamin (MULTIVITAMIN) tablet Take 1 tablet by mouth daily.    Historical Provider, MD  Potassium 75 MG TABS Take by mouth. Taking around 2000mg  daily    Historical Provider, MD  sertraline (ZOLOFT) 25 MG tablet Take 1 tablet (25 mg total) by mouth daily. 03/25/15   Delfina Redwood, MD  traZODone (DESYREL) 50 MG tablet Take 50 mg by mouth at bedtime.    Historical Provider, MD   BP 140/74 mmHg  Pulse 73  Temp(Src) 98.8 F (37.1 C) (Oral)  Resp 22  SpO2 100% Physical Exam  Constitutional: She is oriented to person, place, and time. She appears well-developed and well-nourished. No distress.  HENT:  Head: Normocephalic and atraumatic.  Eyes: EOM are normal.  Neck: Normal range of motion.  Cardiovascular: Normal rate, regular rhythm and normal heart  sounds.   Pulmonary/Chest: Effort normal and breath sounds normal. She exhibits tenderness (right-sided).  Abdominal: Soft. She exhibits no distension. There is no tenderness.  Musculoskeletal: Normal range of motion.  Neurological: She is alert and oriented to person, place, and time.  Skin: Skin is warm and dry.  Psychiatric: She has a normal mood and affect. Judgment normal.  Nursing note and vitals reviewed.   ED Course  Procedures  DIAGNOSTIC STUDIES: Oxygen Saturation is 92% on  RA, low by my interpretation.    COORDINATION OF CARE: 11:43 PM - Discussed plans to order diagnostic studies and imaging to confirm the rib fractures and pneumothorax, as well as medication for pain. Pt advised of plan for treatment and pt agrees.     Labs Review Labs Reviewed  BASIC METABOLIC PANEL - Abnormal; Notable for the following:    Chloride 100 (*)    Glucose, Bld 103 (*)    All other components within normal limits  CBC  BRAIN NATRIURETIC PEPTIDE    Imaging Review Dg Chest Portable 1 View  11/18/2015  CLINICAL DATA:  Shortness of breath. Rib fracture. Known right pneumothorax. Fell this morning. EXAM: PORTABLE CHEST 1 VIEW COMPARISON:  11/17/2015 FINDINGS: Large right pneumothorax with collapse of most of the right lung. Multiple acute right rib fractures are again demonstrated. Left lung appears clear and expanded. Normal heart size and pulmonary vascularity. Calcified and tortuous aorta. IMPRESSION: Large right pneumothorax with collapse of the right lung. Multiple right rib fractures. Mild progression since previous study. Electronically Signed   By: Lucienne Capers M.D.   On: 11/18/2015 00:02   I have personally reviewed and evaluated these images and lab results as part of my medical decision-making.   EKG Interpretation   Date/Time:  Monday November 17 2015 23:03:43 EDT Ventricular Rate:  88 PR Interval:  190 QRS Duration: 74 QT Interval:  360 QTC Calculation: 435 R Axis:    65 Text Interpretation:  Normal sinus rhythm Anterior infarct , age  undetermined Abnormal ECG No significant change was found Confirmed by  Glenford Garis  MD, Lennette Bihari ([id number]) on 11/17/2015 11:39:02 PM      MDM   Final diagnoses:  Rib fractures, right, closed, initial encounter  Pneumothorax, right   Right sided rib fractures with large pneumothorax.  Spoke with Dr Georganna Skeans of Trauma Surgery who will place chest tube and admit   I personally performed the services described in this documentation, which was scribed in my presence. The recorded information has been reviewed and is accurate.       Jola Schmidt, MD 11/18/15 [phone]

## 2015-11-18 ENCOUNTER — Encounter (HOSPITAL_COMMUNITY): Payer: Self-pay | Admitting: General Practice

## 2015-11-18 ENCOUNTER — Emergency Department (HOSPITAL_COMMUNITY): Payer: Medicare Other

## 2015-11-18 DIAGNOSIS — J939 Pneumothorax, unspecified: Secondary | ICD-10-CM | POA: Diagnosis not present

## 2015-11-18 DIAGNOSIS — R259 Unspecified abnormal involuntary movements: Secondary | ICD-10-CM | POA: Diagnosis not present

## 2015-11-18 DIAGNOSIS — Z9181 History of falling: Secondary | ICD-10-CM | POA: Diagnosis not present

## 2015-11-18 DIAGNOSIS — Y92009 Unspecified place in unspecified non-institutional (private) residence as the place of occurrence of the external cause: Secondary | ICD-10-CM

## 2015-11-18 DIAGNOSIS — S2241XA Multiple fractures of ribs, right side, initial encounter for closed fracture: Secondary | ICD-10-CM | POA: Diagnosis present

## 2015-11-18 DIAGNOSIS — S99922A Unspecified injury of left foot, initial encounter: Secondary | ICD-10-CM | POA: Diagnosis not present

## 2015-11-18 DIAGNOSIS — R41841 Cognitive communication deficit: Secondary | ICD-10-CM | POA: Diagnosis not present

## 2015-11-18 DIAGNOSIS — W19XXXA Unspecified fall, initial encounter: Secondary | ICD-10-CM

## 2015-11-18 DIAGNOSIS — M7989 Other specified soft tissue disorders: Secondary | ICD-10-CM | POA: Diagnosis not present

## 2015-11-18 DIAGNOSIS — M79672 Pain in left foot: Secondary | ICD-10-CM | POA: Diagnosis not present

## 2015-11-18 DIAGNOSIS — Z79899 Other long term (current) drug therapy: Secondary | ICD-10-CM | POA: Diagnosis not present

## 2015-11-18 DIAGNOSIS — M6281 Muscle weakness (generalized): Secondary | ICD-10-CM | POA: Diagnosis not present

## 2015-11-18 DIAGNOSIS — S270XXA Traumatic pneumothorax, initial encounter: Secondary | ICD-10-CM | POA: Diagnosis not present

## 2015-11-18 DIAGNOSIS — Z886 Allergy status to analgesic agent status: Secondary | ICD-10-CM | POA: Diagnosis not present

## 2015-11-18 DIAGNOSIS — S2241XD Multiple fractures of ribs, right side, subsequent encounter for fracture with routine healing: Secondary | ICD-10-CM | POA: Diagnosis not present

## 2015-11-18 DIAGNOSIS — Z4789 Encounter for other orthopedic aftercare: Secondary | ICD-10-CM | POA: Diagnosis not present

## 2015-11-18 DIAGNOSIS — F419 Anxiety disorder, unspecified: Secondary | ICD-10-CM | POA: Diagnosis present

## 2015-11-18 DIAGNOSIS — R296 Repeated falls: Secondary | ICD-10-CM | POA: Diagnosis not present

## 2015-11-18 DIAGNOSIS — W01198A Fall on same level from slipping, tripping and stumbling with subsequent striking against other object, initial encounter: Secondary | ICD-10-CM | POA: Diagnosis present

## 2015-11-18 DIAGNOSIS — J9811 Atelectasis: Secondary | ICD-10-CM | POA: Diagnosis not present

## 2015-11-18 DIAGNOSIS — E785 Hyperlipidemia, unspecified: Secondary | ICD-10-CM | POA: Diagnosis present

## 2015-11-18 DIAGNOSIS — S272XXA Traumatic hemopneumothorax, initial encounter: Secondary | ICD-10-CM | POA: Diagnosis not present

## 2015-11-18 DIAGNOSIS — Y92002 Bathroom of unspecified non-institutional (private) residence single-family (private) house as the place of occurrence of the external cause: Secondary | ICD-10-CM | POA: Diagnosis not present

## 2015-11-18 DIAGNOSIS — F329 Major depressive disorder, single episode, unspecified: Secondary | ICD-10-CM | POA: Diagnosis present

## 2015-11-18 DIAGNOSIS — Z7982 Long term (current) use of aspirin: Secondary | ICD-10-CM | POA: Diagnosis not present

## 2015-11-18 DIAGNOSIS — E612 Magnesium deficiency: Secondary | ICD-10-CM | POA: Diagnosis not present

## 2015-11-18 DIAGNOSIS — R0602 Shortness of breath: Secondary | ICD-10-CM | POA: Diagnosis present

## 2015-11-18 DIAGNOSIS — Z880 Allergy status to penicillin: Secondary | ICD-10-CM | POA: Diagnosis not present

## 2015-11-18 DIAGNOSIS — W0110XS Fall on same level from slipping, tripping and stumbling with subsequent striking against unspecified object, sequela: Secondary | ICD-10-CM | POA: Diagnosis not present

## 2015-11-18 DIAGNOSIS — E039 Hypothyroidism, unspecified: Secondary | ICD-10-CM | POA: Diagnosis not present

## 2015-11-18 DIAGNOSIS — S2231XA Fracture of one rib, right side, initial encounter for closed fracture: Secondary | ICD-10-CM | POA: Diagnosis not present

## 2015-11-18 HISTORY — DX: Unspecified place in unspecified non-institutional (private) residence as the place of occurrence of the external cause: W19.XXXA

## 2015-11-18 HISTORY — DX: Unspecified fall, initial encounter: Y92.009

## 2015-11-18 LAB — MRSA PCR SCREENING: MRSA by PCR: NEGATIVE

## 2015-11-18 LAB — BRAIN NATRIURETIC PEPTIDE: B Natriuretic Peptide: 37.1 pg/mL (ref 0.0–100.0)

## 2015-11-18 MED ORDER — TRAZODONE HCL 50 MG PO TABS
50.0000 mg | ORAL_TABLET | Freq: Every day | ORAL | Status: DC
  Administered 2015-11-18 – 2015-11-20 (×2): 50 mg via ORAL
  Filled 2015-11-18 (×3): qty 1

## 2015-11-18 MED ORDER — ENSURE ENLIVE PO LIQD: 237.0000 mL | Freq: Two times a day (BID) | ORAL | Status: DC

## 2015-11-18 MED ORDER — DIPHENHYDRAMINE HCL 25 MG PO CAPS
25.0000 mg | ORAL_CAPSULE | Freq: Every evening | ORAL | Status: DC | PRN
Start: 2015-11-18 — End: 2015-11-21

## 2015-11-18 MED ORDER — KCL IN DEXTROSE-NACL 20-5-0.45 MEQ/L-%-% IV SOLN
INTRAVENOUS | Status: DC
  Administered 2015-11-18 – 2015-11-19 (×3): via INTRAVENOUS
  Filled 2015-11-18 (×5): qty 1000

## 2015-11-18 MED ORDER — ONDANSETRON HCL 4 MG PO TABS: 4.0000 mg | ORAL_TABLET | Freq: Four times a day (QID) | ORAL | Status: DC | PRN

## 2015-11-18 MED ORDER — CETYLPYRIDINIUM CHLORIDE 0.05 % MT LIQD
7.0000 mL | Freq: Two times a day (BID) | OROMUCOSAL | Status: DC
  Administered 2015-11-20 – 2015-11-21 (×2): 7 mL via OROMUCOSAL

## 2015-11-18 MED ORDER — FENTANYL CITRATE (PF) 100 MCG/2ML IJ SOLN
50.0000 ug | Freq: Once | INTRAMUSCULAR | Status: AC
  Administered 2015-11-18: 50 ug via INTRAVENOUS
  Filled 2015-11-18: qty 2

## 2015-11-18 MED ORDER — PANTOPRAZOLE SODIUM 40 MG IV SOLR: 40.0000 mg | Freq: Every day | INTRAVENOUS | Status: DC

## 2015-11-18 MED ORDER — DOCUSATE SODIUM 100 MG PO CAPS
100.0000 mg | ORAL_CAPSULE | Freq: Two times a day (BID) | ORAL | Status: DC
  Administered 2015-11-19 – 2015-11-21 (×5): 100 mg via ORAL
  Filled 2015-11-18 (×6): qty 1

## 2015-11-18 MED ORDER — ALPRAZOLAM 0.5 MG PO TABS
0.5000 mg | ORAL_TABLET | Freq: Two times a day (BID) | ORAL | Status: DC | PRN
  Administered 2015-11-18 (×2): 0.5 mg via ORAL
  Administered 2015-11-20: 0.25 mg via ORAL
  Filled 2015-11-18: qty 2
  Filled 2015-11-18: qty 1
  Filled 2015-11-18: qty 2

## 2015-11-18 MED ORDER — MELOXICAM 7.5 MG PO TABS
15.0000 mg | ORAL_TABLET | Freq: Every day | ORAL | Status: DC
  Filled 2015-11-18 (×2): qty 2

## 2015-11-18 MED ORDER — OXYCODONE HCL 5 MG PO TABS
10.0000 mg | ORAL_TABLET | ORAL | Status: DC | PRN
  Filled 2015-11-18: qty 2

## 2015-11-18 MED ORDER — MELOXICAM 7.5 MG PO TABS: 7.5000 mg | ORAL_TABLET | Freq: Every day | ORAL | Status: DC

## 2015-11-18 MED ORDER — DIPHENHYDRAMINE HCL (SLEEP) 25 MG PO TABS: 25.0000 mg | ORAL_TABLET | Freq: Every evening | ORAL | Status: DC | PRN

## 2015-11-18 MED ORDER — CYCLOBENZAPRINE HCL 10 MG PO TABS
5.0000 mg | ORAL_TABLET | Freq: Three times a day (TID) | ORAL | Status: DC | PRN
  Administered 2015-11-18: 5 mg via ORAL
  Filled 2015-11-18 (×2): qty 1

## 2015-11-18 MED ORDER — MORPHINE SULFATE (PF) 2 MG/ML IV SOLN
2.0000 mg | INTRAVENOUS | Status: DC | PRN
  Administered 2015-11-18: 4 mg via INTRAVENOUS
  Filled 2015-11-18: qty 2

## 2015-11-18 MED ORDER — TRAMADOL HCL 50 MG PO TABS
50.0000 mg | ORAL_TABLET | Freq: Four times a day (QID) | ORAL | Status: DC | PRN
  Administered 2015-11-18 – 2015-11-20 (×6): 100 mg via ORAL
  Filled 2015-11-18 (×6): qty 2

## 2015-11-18 MED ORDER — MORPHINE SULFATE (PF) 2 MG/ML IV SOLN
2.0000 mg | INTRAVENOUS | Status: DC | PRN
  Administered 2015-11-18 – 2015-11-20 (×7): 2 mg via INTRAVENOUS
  Filled 2015-11-18 (×7): qty 1

## 2015-11-18 MED ORDER — ONDANSETRON HCL 4 MG/2ML IJ SOLN: 4.0000 mg | Freq: Once | INTRAMUSCULAR | Status: DC

## 2015-11-18 MED ORDER — ACETAMINOPHEN 325 MG PO TABS: 650.0000 mg | ORAL_TABLET | Freq: Four times a day (QID) | ORAL | Status: DC | PRN

## 2015-11-18 MED ORDER — POLYETHYLENE GLYCOL 3350 17 G PO PACK
17.0000 g | PACK | Freq: Every day | ORAL | Status: DC
  Administered 2015-11-19 – 2015-11-20 (×2): 17 g via ORAL
  Filled 2015-11-18 (×2): qty 1

## 2015-11-18 MED ORDER — ONDANSETRON HCL 4 MG/2ML IJ SOLN
4.0000 mg | Freq: Four times a day (QID) | INTRAMUSCULAR | Status: DC | PRN
  Administered 2015-11-18: 4 mg via INTRAVENOUS
  Filled 2015-11-18: qty 2

## 2015-11-18 MED ORDER — LEVOTHYROXINE SODIUM 75 MCG PO TABS
75.0000 ug | ORAL_TABLET | Freq: Every day | ORAL | Status: DC
  Administered 2015-11-19 – 2015-11-21 (×3): 75 ug via ORAL
  Filled 2015-11-18 (×3): qty 1

## 2015-11-18 MED ORDER — PANTOPRAZOLE SODIUM 40 MG PO TBEC: 40.0000 mg | DELAYED_RELEASE_TABLET | Freq: Every day | ORAL | Status: DC

## 2015-11-18 MED ORDER — HYDROCHLOROTHIAZIDE 25 MG PO TABS
25.0000 mg | ORAL_TABLET | Freq: Every day | ORAL | Status: DC
  Filled 2015-11-18 (×2): qty 1

## 2015-11-18 MED ORDER — LIDOCAINE-EPINEPHRINE (PF) 2 %-1:200000 IJ SOLN
20.0000 mL | Freq: Once | INTRAMUSCULAR | Status: AC
  Administered 2015-11-18: 20 mL
  Filled 2015-11-18: qty 20

## 2015-11-18 MED ORDER — MORPHINE SULFATE (PF) 4 MG/ML IV SOLN
4.0000 mg | Freq: Once | INTRAVENOUS | Status: AC
  Administered 2015-11-18: 4 mg via INTRAVENOUS
  Filled 2015-11-18: qty 1

## 2015-11-18 MED ORDER — SERTRALINE HCL 50 MG PO TABS
25.0000 mg | ORAL_TABLET | Freq: Every day | ORAL | Status: DC
  Filled 2015-11-18 (×2): qty 1

## 2015-11-18 MED ORDER — OXYCODONE HCL 5 MG PO TABS
5.0000 mg | ORAL_TABLET | ORAL | Status: DC | PRN
  Administered 2015-11-18: 5 mg via ORAL
  Filled 2015-11-18: qty 1

## 2015-11-18 NOTE — ED Notes (Signed)
Pt transferred to hospital bed

## 2015-11-18 NOTE — Procedures (Signed)
Chest Tube Insertion Procedure Note  Pre-operative Diagnosis: Right pneumothorax  Post-operative Diagnosis: Right pneumothorax  Procedure Details  Informed consent was obtained for the procedure, including sedation.  Risks of lung perforation, hemorrhage, arrhythmia, and adverse drug reaction were discussed.   After sterile skin prep, using standard technique, a 20 French tube was placed in the right anterior axillary line, nipple level Findings: Rush of air  Estimated Blood Loss:  Minimal         Specimens:  None              Complications:  None; patient tolerated the procedure well.         Disposition: ED awaiting admission         Condition: stable  Georganna Skeans, MD, MPH, FACS Trauma: [phone] General Surgery: [phone]}

## 2015-11-18 NOTE — ED Notes (Signed)
Message sent to pharmacy requesting that the dextrose 5% and 0.45% NaCl with KCl 20 mEq/L infusion be verified and sent to [facility].

## 2015-11-18 NOTE — ED Notes (Signed)
Report called  

## 2015-11-18 NOTE — ED Notes (Signed)
Ordered diet charge

## 2015-11-18 NOTE — ED Notes (Signed)
PT transferred to bedside toilet, when this happened the pt became nauseated and threw up on the floor.

## 2015-11-18 NOTE — ED Notes (Signed)
Dr. Thompson at bedside. 

## 2015-11-18 NOTE — H&P (Signed)
Mackenzie Blanchard is an 75 y.o. female.   Chief Complaint: Right rib pain HPI: Shantera lost her footing and fell in the bathroom earlier today. She struck the right side of her chest on the bathtub. She had no loss of consciousness. She was evaluated at an outpatient clinic and chest x-ray there demonstrated right rib fractures with pneumothorax. She was directed to the emergency department. Further imaging here demonstrated right rib fractures 4-6 and large right pneumothorax. I was asked to see her for treatment and admission.  Past Medical History  Diagnosis Date  . Hypothyroidism   . Edema     idiopathic  . Sinusitis, chronic   . Hyperlipidemia   . Osteopenia   . Cancer [facility])     Past Surgical History  Procedure Laterality Date  . Cataract extraction      bilaterally  . Tubal ligation    . Colonoscopy with propofol N/A 11/16/2012    Procedure: COLONOSCOPY WITH PROPOFOL;  Surgeon: Garlan Fair, MD;  Location: [facility];  Service: Endoscopy;  Laterality: N/A;  h&p in file-[facility]  . Colectomy  05/2013    Family History  Problem Relation Age of Onset  . Lung cancer Mother   . Hypertension Father   . Heart disease Father   . Cancer - Other Brother   . Diabetes Brother    Social History:  reports that she has never smoked. She has never used smokeless tobacco. She reports that she does not drink alcohol or use illicit drugs.  Allergies:  Allergies  Allergen Reactions  . Codeine   . Penicillins      (Not in a hospital admission)  Results for orders placed or performed during the hospital encounter of 11/17/15 (from the past 48 hour(s))  Basic metabolic panel     Status: Abnormal   Collection Time: 11/17/15 11:10 PM  Result Value Ref Range   Sodium 135 135 - 145 mmol/L   Potassium 3.6 3.5 - 5.1 mmol/L   Chloride 100 (L) 101 - 111 mmol/L   CO2 26 22 - 32 mmol/L   Glucose, Bld 103 (H) 65 - 99 mg/dL   BUN 12 6 - 20 mg/dL   Creatinine, Ser 0.56 0.44 - 1.00 mg/dL   Calcium 10.1 8.9 - 10.3 mg/dL   GFR calc non Af Amer >60 >60 mL/min   GFR calc Af Amer >60 >60 mL/min    Comment: (NOTE) The eGFR has been calculated using the CKD EPI equation. This calculation has not been validated in all clinical situations. eGFR's persistently <60 mL/min signify possible Chronic Kidney Disease.    Anion gap 9 5 - 15  CBC     Status: None   Collection Time: 11/17/15 11:10 PM  Result Value Ref Range   WBC 7.7 4.0 - 10.5 K/uL   RBC 4.27 3.87 - 5.11 MIL/uL   Hemoglobin 13.1 12.0 - 15.0 g/dL   HCT 39.3 36.0 - 46.0 %   MCV 92.0 78.0 - 100.0 fL   MCH 30.7 26.0 - 34.0 pg   MCHC 33.3 30.0 - 36.0 g/dL   RDW 12.8 11.5 - 15.5 %   Platelets 232 150 - 400 K/uL   Dg Chest Portable 1 View  11/18/2015  CLINICAL DATA:  Shortness of breath. Rib fracture. Known right pneumothorax. Fell this morning. EXAM: PORTABLE CHEST 1 VIEW COMPARISON:  11/17/2015 FINDINGS: Large right pneumothorax with collapse of most of the right lung. Multiple acute right rib fractures are again demonstrated. Left  lung appears clear and expanded. Normal heart size and pulmonary vascularity. Calcified and tortuous aorta. IMPRESSION: Large right pneumothorax with collapse of the right lung. Multiple right rib fractures. Mild progression since previous study. Electronically Signed   By: Lucienne Capers M.D.   On: 11/18/2015 00:02    Review of Systems  Constitutional: Negative.   Eyes: Negative.   Respiratory: Positive for shortness of breath.   Cardiovascular: Positive for chest pain.  Gastrointestinal: Negative for nausea, vomiting and abdominal pain.  Genitourinary: Negative.   Musculoskeletal: Positive for joint pain and neck pain.  Skin: Negative.   Neurological: Negative for sensory change and loss of consciousness.  Endo/Heme/Allergies: Negative.   Psychiatric/Behavioral: Negative.     Blood pressure 140/74, pulse 73, temperature 98.8 F (37.1 C), temperature source Oral, resp. rate 22, SpO2 100  %. Physical Exam  Constitutional: She appears well-developed and well-nourished.  HENT:  Head: Normocephalic.  Right Ear: External ear normal.  Left Ear: External ear normal.  Nose: Nose normal.  Mouth/Throat: Oropharynx is clear and moist.  Eyes: Conjunctivae and EOM are normal. Pupils are equal, round, and reactive to light.  Neck: Neck supple. No tracheal deviation present.  No posterior midline tenderness  Cardiovascular: Normal rate, regular rhythm and normal heart sounds.   Respiratory: Effort normal. No stridor. No respiratory distress. She has no wheezes. She has no rales. She exhibits tenderness.  Decreased breath sounds on right, right-sided rib tenderness  GI: Soft. She exhibits no distension. There is no tenderness. There is no rebound and no guarding.  Musculoskeletal: Normal range of motion. She exhibits edema. She exhibits no tenderness.  Mild peripheral edema  Neurological: She is alert. She displays no atrophy and no tremor. No sensory deficit. She exhibits normal muscle tone. She displays no seizure activity. GCS eye subscore is 4. GCS verbal subscore is 5. GCS motor subscore is 6.  Moves all extremities with good strength, somewhat limitation movement right shoulder  Skin: Skin is warm.  Psychiatric: She has a normal mood and affect.     Assessment/Plan Fall with right-sided rib fractures 4-6 and large right pneumothorax - right-sided chest tube placed in the emergency department. We'll check follow-up chest x-ray and admit to stepdown unit for further observation and pulmonary toilet. I discussed the plan with her daughter.  Zenovia Jarred, MD 11/18/2015, 1:05 AM

## 2015-11-18 NOTE — Progress Notes (Signed)
Patient ID: Mackenzie Blanchard, female   DOB: 1940/10/26, 75 y.o.   MRN: [id number]   LOS: 0 days   Subjective: No unexpected c/o, over narcotized.   Objective: Vital signs in last 24 hours: Temp:  [98.8 F (37.1 C)] 98.8 F (37.1 C) (04/17 2301) Pulse Rate:  [64-89] 64 (04/18 0800) Resp:  [7-24] 8 (04/18 0800) BP: (103-178)/(49-91) 127/60 mmHg (04/18 0800) SpO2:  [92 %-100 %] 100 % (04/18 0800)    IS: <257ml   CT No air leak Minimal OP   Physical Exam General appearance: somnolent and no distress Resp: clear to auscultation bilaterally Cardio: regular rate and rhythm GI: normal findings: bowel sounds normal and soft, non-tender   Assessment/Plan: Fall Multiple right rib fxs w/PTX s/p CT -- Continue suction Mutliple medical problems -- Home meds FEN -- Orals for pain, try to maximize non-narcotic meds. Will give tramadol and increase Mobic to start, d/c oxycodone but will likely need to add it back. VTE -- SCD's, Lovenox Dispo -- Awaiting SDU bed    Lisette Abu, PA-C Pager: [phone] General Trauma PA Pager: [phone]  11/18/2015

## 2015-11-18 NOTE — ED Notes (Signed)
Attempted report 

## 2015-11-19 ENCOUNTER — Inpatient Hospital Stay (HOSPITAL_COMMUNITY): Payer: Medicare Other

## 2015-11-19 LAB — BASIC METABOLIC PANEL
ANION GAP: 6 (ref 5–15)
BUN: 6 mg/dL (ref 6–20)
CO2: 28 mmol/L (ref 22–32)
Calcium: 8.5 mg/dL — ABNORMAL LOW (ref 8.9–10.3)
Chloride: 99 mmol/L — ABNORMAL LOW (ref 101–111)
Creatinine, Ser: 0.51 mg/dL (ref 0.44–1.00)
GFR calc Af Amer: >60 mL/min (ref >60)
GLUCOSE: 94 mg/dL (ref 65–99)
POTASSIUM: 3.5 mmol/L (ref 3.5–5.1)
Sodium: 133 mmol/L — ABNORMAL LOW (ref 135–145)

## 2015-11-19 LAB — CBC
HCT: 35.4 % — ABNORMAL LOW (ref 36.0–46.0)
Hemoglobin: 11.3 g/dL — ABNORMAL LOW (ref 12.0–15.0)
MCH: 29.9 pg (ref 26.0–34.0)
MCHC: 31.9 g/dL (ref 30.0–36.0)
MCV: 93.7 fL (ref 78.0–100.0)
PLATELETS: 183 10*3/uL (ref 150–400)
RBC: 3.78 MIL/uL — AB (ref 3.87–5.11)
RDW: 12.7 % (ref 11.5–15.5)
WBC: 5.2 10*3/uL (ref 4.0–10.5)

## 2015-11-19 NOTE — Evaluation (Signed)
Occupational Therapy Evaluation Patient Details Name: Mackenzie Blanchard MRN: [id number] DOB: [birth year] Today's Date: 11/19/2015    History of Present Illness Patient is a 75 y/o who presents s/p Fall with right-sided rib fractures 4-6 and large right pneumothorax - right-sided chest tube placed in the emergency department. PMH includes HLD, cancer, osteopenia and family reported 'undiagnosed psych conditions) per chart review from admission 8/16.   Clinical Impression   Pt reports she was independent with ADLs and mobility PTA. Currently pt overall min assist +2 for functional mobility and min assist for ADLs. Educated pt on need for OOB activity; pt verbalized understanding but needed constant reminders for why getting OOB is important. Discussed post acute rehab prior to return home with pt; she was agreeable at end of session stating that she did not think she could manage at home alone in her current state. Recommending SNF for follow up in order to maximize independence and safety with ADLs and functional mobility prior to return home. Pt would benefit from continued skilled OT to address established goals.    Follow Up Recommendations  SNF;Supervision/Assistance - 24 hour    Equipment Recommendations  Other (comment) (TBD)    Recommendations for Other Services       Precautions / Restrictions Precautions Precautions: Fall Precaution Comments: watch O2 saturations and RR Restrictions Weight Bearing Restrictions: No      Mobility Bed Mobility Overal bed mobility: Needs Assistance Bed Mobility: Supine to Sit     Supine to sit: Min assist     General bed mobility comments: Significantly increased time to perform. Required increased cues to direct to task. patient with some pain impacting movement. min assist to rotate hips to EOB.  Transfers Overall transfer level: Needs assistance Equipment used: 2 person hand held assist Transfers: Sit to/from Stand Sit to Stand: Min  assist         General transfer comment: Min assist for stability in coming to standing, VCs for hand placement and safety    Balance Overall balance assessment: History of Falls;Needs assistance (Pt reports no hx of falls ) Sitting-balance support: Feet supported;Bilateral upper extremity supported Sitting balance-Leahy Scale: Poor     Standing balance support: Bilateral upper extremity supported;During functional activity Standing balance-Leahy Scale: Poor                              ADL Overall ADL's : Needs assistance/impaired Eating/Feeding: Set up;Sitting   Grooming: Set up;Sitting;Cueing for sequencing;Wash/dry hands;Wash/dry face               Lower Body Dressing: Minimal assistance;Sit to/from stand;+2 for physical assistance   Toilet Transfer: Minimal assistance;+2 for physical assistance;Ambulation;BSC (2 person hand held assist)   Toileting- Clothing Manipulation and Hygiene: Minimal assistance;Sitting/lateral lean       Functional mobility during ADLs: Minimal assistance;+2 for physical assistance (2 person hand held assist) General ADL Comments: Pt with flat affect and lethargy. Required max verbal cues for sequencing and safety throughout therapy session. Discussed post acute rehab with pt; she is agreeable after participating in [place] activities as she realizes she cannot manage on her own at home in her current state.      Vision     Perception     Praxis      Pertinent Vitals/Pain Pain Assessment: Faces Pain Score: 9  Faces Pain Scale: Hurts little more (pt states 9/10 pain but shows no physiological effect) Pain Location: R  side Pain Descriptors / Indicators: Moaning Pain Intervention(s): Monitored during session;Premedicated before session;Repositioned     Hand Dominance Right   Extremity/Trunk Assessment Upper Extremity Assessment Upper Extremity Assessment: Generalized weakness   Lower Extremity Assessment Lower  Extremity Assessment: Defer to PT evaluation   Cervical / Trunk Assessment Cervical / Trunk Assessment: Kyphotic   Communication Communication Communication: No difficulties   Cognition Arousal/Alertness: Lethargic Behavior During Therapy: Flat affect Overall Cognitive Status: Impaired/Different from baseline Area of Impairment: Attention;Following commands;Safety/judgement;Awareness;Problem solving   Current Attention Level: Focused   Following Commands: Follows one step commands with increased time Safety/Judgement: Decreased awareness of safety;Decreased awareness of deficits Awareness: Intellectual Problem Solving: Slow processing;Decreased initiation;Difficulty sequencing;Requires verbal cues;Requires tactile cues General Comments: Patient solmulent and flat. Patient freezing in the middle of task performance and requires max cues to direct back to task for completion such as applying chapstick. ? medication impact   General Comments       Exercises       Shoulder Instructions      Home Living Family/patient expects to be discharged to:: Private residence Living Arrangements: Alone Available Help at Discharge: Family;Available PRN/intermittently Type of Home: House Home Access: Stairs to enter [employer] of Steps: 2 Entrance Stairs-Rails: Right Home Layout: One level (To put trash can out, needs to descend 7 steps with HR)     Bathroom Shower/Tub: Tub/shower unit Shower/tub characteristics: Architectural technologist: Standard     Home Equipment: Environmental consultant - 2 wheels;Bedside commode          Prior Functioning/Environment Level of Independence: Independent        Comments: Prior to 2 weeks ago, pt gardening, cooking, driving etc. reports neck/shoulder have been getting worse lately resulting in need for ED.    OT Diagnosis: Generalized weakness;Cognitive deficits;Acute pain;Altered mental status   OT Problem List: Decreased strength;Decreased  activity tolerance;Impaired balance (sitting and/or standing);Decreased cognition;Decreased safety awareness;Decreased knowledge of use of DME or AE;Decreased knowledge of precautions;Pain   OT Treatment/Interventions: Self-care/ADL training;Energy conservation;DME and/or AE instruction;Therapeutic activities;Patient/family education;Balance training    OT Goals(Current goals can be found in the care plan section) Acute Rehab OT Goals Patient Stated Goal: to not hurt OT Goal Formulation: With patient Time For Goal Achievement: 12/03/15 Potential to Achieve Goals: Good ADL Goals Pt Will Perform Grooming: with supervision;standing Pt Will Perform Upper Body Bathing: with set-up;sitting Pt Will Perform Lower Body Bathing: with supervision;sit to/from stand Pt Will Transfer to Toilet: with supervision;ambulating Pt Will Perform Toileting - Clothing Manipulation and hygiene: sit to/from stand;with supervision  OT Frequency: Min 2X/week   Barriers to D/C: Decreased caregiver support  pt lives alone       Co-evaluation PT/OT/SLP Co-Evaluation/Treatment: Yes Reason for Co-Treatment: Complexity of the patient's impairments (multi-system involvement);Necessary to address cognition/behavior during functional activity;For patient/therapist safety PT goals addressed during session: Mobility/safety with mobility OT goals addressed during session: ADL's and self-care;Proper use of Adaptive equipment and DME;Other (comment) (mobility)      End of Session Nurse Communication:  (PT notified RN)  Activity Tolerance: Patient limited by lethargy;Patient limited by pain Patient left: in chair;with call bell/phone within reach   Time: [id number] OT Time Calculation (min): 37 min Charges:  OT General Charges $OT Visit: 1 Procedure OT Evaluation $OT Eval Moderate Complexity: 1 Procedure G-Codes:     Binnie Kand M.S., OTR/L Pager: [phone]  11/19/2015, 5:38 PM

## 2015-11-19 NOTE — Progress Notes (Signed)
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 Patient reports that she follows a "no sodium" diet. She avoids processed foods and breads. She thinks 1500 mg of sodium is "way too much."  RD provided "Low Sodium Nutrition Therapy" handout from the Academy of Nutrition and Dietetics. Encouraged fresh fruits and vegetables as well as whole grain sources of carbohydrates to maximize fiber intake.   Expect good compliance.  BMI=23.6 (using last weight and height available). Pt meets criteria for normal weight based on current BMI. Patient reports that she has lost some weight, her weight fluctuates due to chronic edema and fluid shifts. She has been eating well at home.  Current diet order is regular, daughter is bringing in food for patient. Patient refuses Ensure supplements because they contain too much sodium. She is also not consuming the meals sent from the kitchen. Nutrition-Focused physical exam completed. Difficult to determine fat and muscle status due to edema. No significant depletion of muscle or fat mass noted.   Labs and medications reviewed. No further nutrition interventions warranted at this time. RD contact information provided. If additional nutrition issues arise, please re-consult RD.   Molli Barrows, RD, LDN, [place] Pager [phone] After Hours Pager [phone]

## 2015-11-19 NOTE — Evaluation (Signed)
Physical Therapy Evaluation Patient Details Name: Mackenzie Blanchard MRN: [id number] DOB: May 14, 1941 Today's Date: 11/19/2015   History of Present Illness  Patient is a 75 y/o who presents s/p Fall with right-sided rib fractures 4-6 and large right pneumothorax - right-sided chest tube placed in the emergency department. PMH includes HLD, cancer, osteopenia and family reported 'undiagnosed psych conditions) per chart review from admission 8/16.    Clinical Impression  Patient demonstrates deficits in functional mobility as indicated below. Will need continued skilled PT to address deficits and maximize function. Will see as indicated and progress as tolerated. OF NOTE: Patient very withdrawn, flat and somnolent during session requiring max cues to direct to simple task completion like putting on chapstick patient sat for several moments with hand in front of her face holding open chapstick but not doing anything. Patient appears confused throughout session with poor awareness and lack of insight. At this time, DO NOT feel patient is appropriate for d/c home alone. Highly recommend ST SNF upon acute discharge unless family can provide 24/7 hands on physical assist.   Activity on room air with saturations 91%, replaced 1 liter and improved to 94%. Nsg aware     Follow Up Recommendations SNF;Supervision/Assistance - 24 hour    Equipment Recommendations  Other (comment) (TBD)    Recommendations for Other Services       Precautions / Restrictions Precautions Precautions: Fall Precaution Comments: watch O2 saturations and RR      Mobility  Bed Mobility Overal bed mobility: Needs Assistance Bed Mobility: Supine to Sit     Supine to sit: Min assist     General bed mobility comments: Significantly increased time to perform. Required increased cues to direct to task. patient with some pain impacting movement. min assist to rotate hips to EOB.  Transfers Overall transfer level: Needs  assistance Equipment used: 2 person hand held assist Transfers: Sit to/from Stand Sit to Stand: Min assist         General transfer comment: Min assist for stability in coming to standing, VCs for hand placement and safety  Ambulation/Gait Ambulation/Gait assistance: Min assist Ambulation Distance (Feet): 16 Feet Assistive device: 2 person hand held assist Gait Pattern/deviations: Step-to pattern;Decreased stride length;Shuffle;Narrow base of support   Gait velocity interpretation: <1.8 ft/sec, indicative of risk for recurrent falls General Gait Details: patient extremely slow and guarded, non functional gait pattern, minimal to moderate assist at times for short distance. Performed on room air with saturations dropping to 91%.   Stairs            Wheelchair Mobility    Modified Rankin (Stroke Patients Only)       Balance Overall balance assessment: History of Falls                                           Pertinent Vitals/Pain Pain Assessment: Faces Pain Score: 9  Faces Pain Scale: Hurts little more (pt states 9/10 pain but shows no physiological effect) Pain Location: right side Pain Descriptors / Indicators: Moaning Pain Intervention(s): Monitored during session;Premedicated before session;Repositioned    Home Living Family/patient expects to be discharged to:: Private residence Living Arrangements: Alone Available Help at Discharge: Family;Available PRN/intermittently Type of Home: House Home Access: Stairs to enter Entrance Stairs-Rails: Right Entrance Stairs-Number of Steps: 2 Home Layout: One level (To put trash can out, needs to descend 7 steps  with HR) Home Equipment: Walker - 2 wheels;Bedside commode      Prior Function Level of Independence: Independent         Comments: Prior to 2 weeks ago, pt gardening, cooking, driving etc. reports neck/shoulder have been getting worse lately resulting in need for ED.     Hand  Dominance   Dominant Hand: Right    Extremity/Trunk Assessment               Lower Extremity Assessment: Generalized weakness;Difficult to assess due to impaired cognition      Cervical / Trunk Assessment: Kyphotic  Communication   Communication: No difficulties  Cognition Arousal/Alertness: Lethargic Behavior During Therapy: Flat affect Overall Cognitive Status: Impaired/Different from baseline Area of Impairment: Attention;Following commands;Safety/judgement;Awareness;Problem solving   Current Attention Level: Focused   Following Commands: Follows one step commands with increased time Safety/Judgement: Decreased awareness of safety;Decreased awareness of deficits Awareness: Intellectual Problem Solving: Slow processing;Decreased initiation;Difficulty sequencing;Requires verbal cues;Requires tactile cues General Comments: Patient solmulent and flat. Patient freezing in the middle of task performance and requires max cues to direct back to task for completion such as applying chapstick. ? medication impact    General Comments      Exercises        Assessment/Plan    PT Assessment Patient needs continued PT services  PT Diagnosis Difficulty walking;Abnormality of gait;Generalized weakness;Acute pain;Altered mental status   PT Problem List Decreased strength;Decreased activity tolerance;Decreased balance;Decreased mobility;Decreased cognition;Decreased safety awareness;Cardiopulmonary status limiting activity;Pain  PT Treatment Interventions DME instruction;Gait training;Functional mobility training;Therapeutic activities;Therapeutic exercise;Balance training;Cognitive remediation;Patient/family education   PT Goals (Current goals can be found in the Care Plan section) Acute Rehab PT Goals Patient Stated Goal: to not hurt PT Goal Formulation: With patient Time For Goal Achievement: 12/03/15 Potential to Achieve Goals: Fair    Frequency Min 3X/week   Barriers to  discharge Decreased caregiver support patient lives alone    Co-evaluation PT/OT/SLP Co-Evaluation/Treatment: Yes Reason for Co-Treatment: Complexity of the patient's impairments (multi-system involvement);Necessary to address cognition/behavior during functional activity;For patient/therapist safety PT goals addressed during session: Mobility/safety with mobility         End of Session Equipment Utilized During Treatment: Oxygen Activity Tolerance: Patient limited by fatigue;Patient limited by lethargy;Patient limited by pain (cognition) Patient left: in chair;with call bell/phone within reach Nurse Communication: Mobility status         Time: [id number] PT Time Calculation (min) (ACUTE ONLY): 37 min   Charges:   PT Evaluation $PT Eval Moderate Complexity: 1 Procedure     PT G CodesDuncan Dull 12-15-15, [place], [place] DPT  [phone]

## 2015-11-19 NOTE — Clinical Social Work Note (Signed)
Clinical Social Work Assessment  Patient Details  Name: Mackenzie Blanchard MRN: [id number] Date of Birth: Apr 17, 1941  Date of referral:  11/19/15               Reason for consult:  Trauma                Permission sought to share information with:  Family Supports Permission granted to share information::  Yes, Verbal Permission Granted  Name::     Laurelyn Ferriss  Relationship::  Daughter  Contact Information:  [phone]  Housing/Transportation Living arrangements for the past 2 months:  [place] of Information:  Patient Patient Interpreter Needed:  None Criminal Activity/Legal Involvement Pertinent to Current Situation/Hospitalization:  No - Comment as needed Significant Relationships:  Adult Children Lives with:  Self Do you feel safe going back to the place where you live?  Yes (Patient son and daughter plan to provide intermittent supervision per patient) Need for family participation in patient care:  Yes (Comment)  Care giving concerns:  No family/friends present at bedside.  Patient states that her daughter lives near by and her son plans to come to assist from [place].   Social Worker assessment / plan:  Holiday representative met with patient at bedside to offer support and discuss patient needs at discharge.  Patient states that she lives at home alone and fell while in the bath tub.  Patient had an appointment with the pain management clinic earlier that day and was frustrated that she did not begin a regimen.  Patient states that her daughter lives nearby and is able to provide some assistance and her son will be traveling from [place] to provide additional assistance.  Patient not overly forthcoming with information, however states that she plans to return home with family support.  CSW inquired about the potential need for SNF placement, but patient quickly dismissed.  Clinical Social Worker inquired about current substance use.  Patient states  that she does not drink or use drugs at this time.  SBIRT complete and no resources needed at this time.  Clinical Social Worker will sign off for now as social work intervention is no longer needed. Please consult [place] again if new need arises.  Employment status:  Retired Nurse, adult PT Recommendations:  Not assessed at this time Information / Referral to community resources:  SBIRT, [place]  Patient/Family's Response to care:  Patient verbalized understanding and appreciation for CSW role and involvement.  Patient agreeable with return home and plans to have family provide necessary assistance.  Patient/Family's Understanding of and Emotional Response to Diagnosis, Current Treatment, and Prognosis:  Patient with an extremely flat affect and does not portray any type of emotional response regarding her current treatments and or potential physical barriers for return home at discharge.  Emotional Assessment Appearance:  Appears younger than stated age Attitude/Demeanor/Rapport:  Lethargic Affect (typically observed):  Flat, Quiet Orientation:  Oriented to Self, Oriented to Place, Oriented to  Time, Oriented to Situation Alcohol / Substance use:  Never Used Psych involvement (Current and /or in the community):  No (Comment)  Discharge Needs  Concerns to be addressed:  Home Safety Concerns Readmission within the last 30 days:  No Current discharge risk:  Lives alone Barriers to Discharge:  Continued Medical Work up  [employer], [place]

## 2015-11-19 NOTE — Progress Notes (Signed)
Patient ID: Mackenzie Blanchard, female   DOB: [birth year], 75 y.o.   MRN: [id number]    Subjective: Pain control good after adjustments yesterday  Objective: Vital signs in last 24 hours: Temp:  [98 F (36.7 C)-98.3 F (36.8 C)] 98.1 F (36.7 C) (04/19 0800) Pulse Rate:  [56-80] 67 (04/19 0500) Resp:  [8-112] 112 (04/19 0031) BP: (110-148)/(56-77) 138/68 mmHg (04/19 0500) SpO2:  [96 %-100 %] 98 % (04/19 0500) Last BM Date: 11/16/15  Intake/Output from previous day: 04/18 0701 - 04/19 0700 In: 360 [P.O.:360] Out: 636 [Urine:550; Chest Tube:86] Intake/Output this shift:    General appearance: alert and cooperative Resp: clear to auscultation bilaterally Cardio: regular rate and rhythm GI: soft, NT  Lab Results: CBC   Recent Labs  11/17/15 2310 11/19/15 0506  WBC 7.7 5.2  HGB 13.1 11.3*  HCT 39.3 35.4*  PLT 232 183   BMET  Recent Labs  11/17/15 2310 11/19/15 0506  NA 135 133*  K 3.6 3.5  CL 100* 99*  CO2 26 28  GLUCOSE 103* 94  BUN 12 6  CREATININE 0.56 0.51  CALCIUM 10.1 8.5*   Anti-infectives: Anti-infectives    None      Assessment/Plan: Fall Multiple right rib fxs w/PTX s/p CT - place CT to water seal, CXR in AM, IS today less than 500cc - encouraged her to work on it Mutliple medical problems - Home meds FEN - adjustments to pain meds yesterday worked well VTE - SCD's, Lovenox Dispo - Chest tube, PT/OT to eval   LOS: 1 day    Georganna Skeans, MD, MPH, FACS Trauma: [phone] General Surgery: [phone]  11/19/2015

## 2015-11-20 ENCOUNTER — Inpatient Hospital Stay (HOSPITAL_COMMUNITY): Payer: Medicare Other

## 2015-11-20 MED ORDER — TRAMADOL HCL 50 MG PO TABS
100.0000 mg | ORAL_TABLET | Freq: Four times a day (QID) | ORAL | Status: DC
Start: 2015-11-20 — End: 2015-11-21
  Administered 2015-11-20: 100 mg via ORAL
  Filled 2015-11-20: qty 2

## 2015-11-20 MED ORDER — HYDROCODONE-ACETAMINOPHEN 5-325 MG PO TABS
0.5000 | ORAL_TABLET | ORAL | Status: DC | PRN
  Administered 2015-11-20 – 2015-11-21 (×2): 0.5 via ORAL
  Administered 2015-11-21: 1 via ORAL
  Filled 2015-11-20 (×3): qty 1

## 2015-11-20 NOTE — Clinical Social Work Note (Signed)
Clinical Social Worker received notification from RN, that patient and patient family are now requesting SNF placement.  CSW met with patient and patient daughter at bedside to explain SNF search and placement process.  Patient daughter states that patient has not been in a facility in the past, however now agreeable.  Patient daughter is a good family friend of Dr. Felipa Eth and requests placement at [facility].  CSW initiated search and extended bed offer from [facility] to patient daughter - patient daughter accepted and prepared for discharge tomorrow.  CSW submitted insurance and awaiting authorization.  CSW remains available for support and to facilitate patient discharge needs once medically stable.  Barbette Or, [place]

## 2015-11-20 NOTE — NC FL2 (Signed)
[place] LEVEL OF CARE SCREENING TOOL     IDENTIFICATION  Patient Name: Mackenzie Blanchard Birthdate: 09-Aug-1940 Sex: female Admission Date (Current Location): 11/17/2015  [facility] and [place] Number:  Kathleen Argue [id number] Facility and Address:  The . [facility], [place] [address] [place], [place] [address]      Provider Number: [id number]  Attending Physician Name and Address:  Trauma Md, MD  Relative Name and Phone Number:       Current Level of Care: Hospital Recommended Level of Care: [place] Prior Approval Number:    Date Approved/Denied:   PASRR Number:    Discharge Plan: SNF    Current Diagnoses: Patient Active Problem List   Diagnosis Date Noted  . Fall 11/18/2015  . Multiple fractures of ribs of right side 11/18/2015  . Traumatic pneumothorax 11/18/2015  . Muscle spasms of neck 03/23/2015  . Acute encephalopathy 03/22/2015  . UTI (lower urinary tract infection) 03/22/2015  . Anxiety 03/22/2015  . Hyponatremia 03/22/2015  . Gait abnormality 05/11/2012  . Weakness 05/11/2012    Orientation RESPIRATION BLADDER Height & Weight     Self, Time, Situation, Place  O2 (1L/min) Continent Weight: 119 lb 12.8 oz (54.341 kg) Height:     BEHAVIORAL SYMPTOMS/MOOD NEUROLOGICAL BOWEL NUTRITION STATUS      Continent Diet (REGULAR)  AMBULATORY STATUS COMMUNICATION OF NEEDS Skin   Limited Assist   Normal                       Personal Care Assistance Level of Assistance  Total care       Total Care Assistance: Limited assistance   Functional Limitations Info             SPECIAL CARE FACTORS FREQUENCY  PT (By licensed PT), OT (By licensed OT)     PT Frequency:  (3x/week) OT Frequency:  (2x/week)            Contractures      Additional Factors Info  Code Status, Allergies Code Status Info:  (FULL) Allergies Info:  (codeine, penicillins)           Current Medications (11/20/2015):  This is  the current hospital active medication list Current Facility-Administered Medications  Medication Dose Route Frequency Provider Last Rate Last Dose  . ALPRAZolam Duanne Moron) tablet 0.5 mg  0.5 mg Oral BID PRN Georganna Skeans, MD   0.25 mg at 11/20/15 1107  . antiseptic oral rinse (CPC / CETYLPYRIDINIUM CHLORIDE 0.05%) solution 7 mL  7 mL Mouth Rinse BID Georganna Skeans, MD   7 mL at 11/18/15 2315  . cyclobenzaprine (FLEXERIL) tablet 5 mg  5 mg Oral TID PRN Georganna Skeans, MD   5 mg at 11/18/15 1016  . diphenhydrAMINE (BENADRYL) capsule 25 mg  25 mg Oral QHS PRN Georganna Skeans, MD      . docusate sodium (COLACE) capsule 100 mg  100 mg Oral BID Lisette Abu, PA-C   100 mg at 11/20/15 0805  . feeding supplement (ENSURE ENLIVE) (ENSURE ENLIVE) liquid 237 mL  237 mL Oral BID BM Lisette Abu, PA-C   237 mL at 11/19/15 1014  . hydrochlorothiazide (HYDRODIURIL) tablet 25 mg  25 mg Oral Daily Georganna Skeans, MD   25 mg at 11/18/15 1005  . HYDROcodone-acetaminophen (NORCO/VICODIN) 5-325 MG per tablet 0.5-2 tablet  0.5-2 tablet Oral Q4H PRN Lisette Abu, PA-C      . levothyroxine (SYNTHROID, LEVOTHROID) tablet 75 mcg  75 mcg Oral Daily Georganna Skeans, MD   75 mcg at 11/20/15 0804  . meloxicam (MOBIC) tablet 15 mg  15 mg Oral Daily Lisette Abu, PA-C   15 mg at 11/18/15 1005  . morphine 2 MG/ML injection 2 mg  2 mg Intravenous Q4H PRN Lisette Abu, PA-C   2 mg at 11/20/15 [id number]  . ondansetron (ZOFRAN) tablet 4 mg  4 mg Oral Q6H PRN Georganna Skeans, MD       Or  . ondansetron [facility]) injection 4 mg  4 mg Intravenous Q6H PRN Georganna Skeans, MD   4 mg at 11/18/15 0452  . polyethylene glycol (MIRALAX / GLYCOLAX) packet 17 g  17 g Oral Daily Lisette Abu, PA-C   17 g at 11/19/15 1009  . sertraline (ZOLOFT) tablet 25 mg  25 mg Oral Daily Georganna Skeans, MD   25 mg at 11/18/15 1005  . traMADol (ULTRAM) tablet 100 mg  100 mg Oral Q6H Lisette Abu, PA-C   0 mg at 11/20/15 0815  . traZODone  (DESYREL) tablet 50 mg  50 mg Oral QHS Georganna Skeans, MD   50 mg at 11/18/15 0224     Discharge Medications: Please see discharge summary for a list of discharge medications.  Relevant Imaging Results:  Relevant Lab Results:   Additional Information  (SSN: [id number] ;  CHEST TUBE REMOVED 4/20)  Leane Call, Student-SW [phone]

## 2015-11-20 NOTE — Progress Notes (Signed)
Rt pleural tube removed without event, pt tolerated well -

## 2015-11-20 NOTE — Clinical Social Work Placement (Signed)
   CLINICAL SOCIAL WORK PLACEMENT  NOTE  Date:  11/20/2015  Patient Details  Name: SHAIYA SEATS MRN: [place]:[id number] Date of Birth: 09/04/1940  Clinical Social Work is seeking post-discharge placement for this patient at the [place] level of care (*CSW will initial, date and re-position this form in  chart as items are completed):  Yes   Patient/family provided with [place] Work Department's list of facilities offering this level of care within the geographic area requested by the patient (or if unable, by the patient's family).  Yes   Patient/family informed of their freedom to choose among providers that offer the needed level of care, that participate in Medicare, Medicaid or managed care program needed by the patient, have an available bed and are willing to accept the patient.  Yes   Patient/family informed of [place]'s ownership interest in [facility] and [facility], as well as of the fact that they are under no obligation to receive care at these facilities.  PASRR submitted to EDS on 11/20/15     PASRR number received on 11/20/15     Existing PASRR number confirmed on       FL2 transmitted to all facilities in geographic area requested by pt/family on 11/20/15     FL2 transmitted to all facilities within larger geographic area on       Patient informed that his/her managed care company has contracts with or will negotiate with certain facilities, including the following:        Yes   Patient/family informed of bed offers received.  Patient [facility] bed at [facility]     Physician recommends and patient chooses bed at [facility]    [name] to   on  .  [name] to facility by       Patient family notified on   of transfer.  Name of family member notified:        PHYSICIAN Please sign FL2     Additional Comment:    Barbette Or, [place]

## 2015-11-20 NOTE — Progress Notes (Signed)
Patient ID: JAMEL BUMSTEAD, female   DOB: 03/09/1941, 75 y.o.   MRN: [place]:[id number]   LOS: 2 days   Subjective: Doing ok, having some left foot pain, worried it may be broken.   Objective: Vital signs in last 24 hours: Temp:  [97.9 F (36.6 C)-98.4 F (36.9 C)] 98.4 F (36.9 C) (04/20 0742) Pulse Rate:  [70-83] 80 (04/20 0742) Resp:  [16-21] 18 (04/20 0742) BP: (128-153)/(62-87) 153/79 mmHg (04/20 0742) SpO2:  [93 %-99 %] 94 % (04/20 0742) Weight:  [54.341 kg (119 lb 12.8 oz)] 54.341 kg (119 lb 12.8 oz) (04/20 0600) Last BM Date: 11/16/15   IS: 576ml (+231ml)   CT No air leak Minimal OP   Radiology Results PORTABLE CHEST 1 VIEW  COMPARISON: Portable chest x-ray of November 19, 2015.  FINDINGS: The lungs are adequately inflated. There is bibasilar subsegmental atelectasis. There is no pneumothorax. The right chest tube projects over the posterior medial aspect of the interspace between the third and fourth ribs. A correction fractures of the fourth and fifth ribs are clearly evident. There may be fractures of the sixth rib laterally. The heart is normal in size. There is tortuosity of the ascending and descending thoracic aorta.  IMPRESSION: No recurrent pneumothorax. The right chest tube is in stable position. Persistent bibasilar subsegmental atelectasis with trace bilateral pleural effusions. There is no CHF.   Electronically Signed  By: David Martinique M.D.  On: 11/20/2015 07:41   Physical Exam General appearance: alert and no distress Resp: clear to auscultation bilaterally Cardio: regular rate and rhythm GI: normal findings: bowel sounds normal and soft, non-tender Extremities: Left foot: TTP proximal to 2-4 MTP joints, no ecchymosis   Assessment/Plan: Fall Multiple right rib fxs w/PTX s/p CT - D/C CT Left foot pain -- Will get x-ray, doubt fx Mutliple medical problems - Home meds FEN - Tramadol not effective enough by itself, will schedule and  add Norco VTE - SCD's, Lovenox Dispo - Likely SNF at discharge, should be ready tomorrow    Lisette Abu, PA-C Pager: [phone] General Trauma PA Pager: [phone]  11/20/2015

## 2015-11-20 NOTE — Progress Notes (Signed)
After pt receiving Ultram 100mg  this am pt became very sleepy and "out of it" as the daughter stated. This happened as well on previous doses. Daughter asked that we try something else for pain to see if it had the same effects. Hydrocodone given this afternoon. Pt state it did help pain more and did not appear to be as sedated. Ultram held at 6pm because pain was controlled with hydrocodone. Pt was apprehensive about getting out of bed to chair this evening, but explained this would help with lung expansion and would help decrease risk of pneumonia. Pt ambulated to chair with help. Will cont to monitor. Carroll Kinds RN

## 2015-11-21 ENCOUNTER — Inpatient Hospital Stay (HOSPITAL_COMMUNITY): Payer: Medicare Other

## 2015-11-21 DIAGNOSIS — S270XXA Traumatic pneumothorax, initial encounter: Secondary | ICD-10-CM | POA: Diagnosis not present

## 2015-11-21 DIAGNOSIS — Z4789 Encounter for other orthopedic aftercare: Secondary | ICD-10-CM | POA: Diagnosis not present

## 2015-11-21 DIAGNOSIS — R41841 Cognitive communication deficit: Secondary | ICD-10-CM | POA: Diagnosis not present

## 2015-11-21 DIAGNOSIS — F419 Anxiety disorder, unspecified: Secondary | ICD-10-CM | POA: Diagnosis not present

## 2015-11-21 DIAGNOSIS — J939 Pneumothorax, unspecified: Secondary | ICD-10-CM | POA: Diagnosis not present

## 2015-11-21 DIAGNOSIS — E039 Hypothyroidism, unspecified: Secondary | ICD-10-CM | POA: Diagnosis not present

## 2015-11-21 DIAGNOSIS — R197 Diarrhea, unspecified: Secondary | ICD-10-CM | POA: Diagnosis not present

## 2015-11-21 DIAGNOSIS — S272XXA Traumatic hemopneumothorax, initial encounter: Secondary | ICD-10-CM | POA: Diagnosis not present

## 2015-11-21 DIAGNOSIS — E612 Magnesium deficiency: Secondary | ICD-10-CM | POA: Diagnosis not present

## 2015-11-21 DIAGNOSIS — R296 Repeated falls: Secondary | ICD-10-CM | POA: Diagnosis not present

## 2015-11-21 DIAGNOSIS — K5900 Constipation, unspecified: Secondary | ICD-10-CM | POA: Diagnosis not present

## 2015-11-21 DIAGNOSIS — M25561 Pain in right knee: Secondary | ICD-10-CM | POA: Diagnosis not present

## 2015-11-21 DIAGNOSIS — M6281 Muscle weakness (generalized): Secondary | ICD-10-CM | POA: Diagnosis not present

## 2015-11-21 DIAGNOSIS — S2241XD Multiple fractures of ribs, right side, subsequent encounter for fracture with routine healing: Secondary | ICD-10-CM | POA: Diagnosis not present

## 2015-11-21 DIAGNOSIS — W0110XS Fall on same level from slipping, tripping and stumbling with subsequent striking against unspecified object, sequela: Secondary | ICD-10-CM | POA: Diagnosis not present

## 2015-11-21 DIAGNOSIS — S2241XA Multiple fractures of ribs, right side, initial encounter for closed fracture: Secondary | ICD-10-CM | POA: Diagnosis not present

## 2015-11-21 DIAGNOSIS — R259 Unspecified abnormal involuntary movements: Secondary | ICD-10-CM | POA: Diagnosis not present

## 2015-11-21 DIAGNOSIS — S2231XA Fracture of one rib, right side, initial encounter for closed fracture: Secondary | ICD-10-CM | POA: Diagnosis not present

## 2015-11-21 DIAGNOSIS — R419 Unspecified symptoms and signs involving cognitive functions and awareness: Secondary | ICD-10-CM | POA: Diagnosis not present

## 2015-11-21 DIAGNOSIS — Z9181 History of falling: Secondary | ICD-10-CM | POA: Diagnosis not present

## 2015-11-21 MED ORDER — POLYETHYLENE GLYCOL 3350 17 G PO PACK
17.0000 g | PACK | Freq: Two times a day (BID) | ORAL | Status: DC
  Administered 2015-11-21: 17 g via ORAL
  Filled 2015-11-21: qty 1

## 2015-11-21 MED ORDER — DOCUSATE SODIUM 100 MG PO CAPS
100.0000 mg | ORAL_CAPSULE | Freq: Two times a day (BID) | ORAL | Status: DC
Start: 2015-11-21 — End: 2018-08-08

## 2015-11-21 MED ORDER — HYDROCODONE-ACETAMINOPHEN 5-325 MG PO TABS: 0.5000 | ORAL_TABLET | ORAL | Status: DC | PRN

## 2015-11-21 MED ORDER — BISACODYL 10 MG RE SUPP: 10.0000 mg | Freq: Every day | RECTAL | Status: DC | PRN

## 2015-11-21 MED ORDER — ALPRAZOLAM 0.25 MG PO TABS: 0.5000 mg | ORAL_TABLET | Freq: Two times a day (BID) | ORAL | Status: DC | PRN

## 2015-11-21 MED ORDER — ALPRAZOLAM 0.5 MG PO TABS: 0.5000 mg | ORAL_TABLET | Freq: Two times a day (BID) | ORAL | Status: DC | PRN

## 2015-11-21 MED ORDER — POLYETHYLENE GLYCOL 3350 17 G PO PACK: 17.0000 g | PACK | Freq: Two times a day (BID) | ORAL | Status: DC

## 2015-11-21 NOTE — Progress Notes (Signed)
Patient discharging to [facility], report called. Patient to be transported.

## 2015-11-21 NOTE — Progress Notes (Signed)
Physical Therapy Treatment Patient Details Name: Mackenzie Blanchard MRN: [place]:[id number] DOB: 02/01/1941 Today's Date: 11/21/2015    History of Present Illness Patient is a 75 y/o who presents s/p Fall with right-sided rib fractures 4-6 and large right pneumothorax - right-sided chest tube placed in the emergency department. PMH includes HLD, cancer, osteopenia and family reported 'undiagnosed psych conditions) per chart review from admission 8/16.    PT Comments    Patient with improved arousal and attention this session. Less physical assist required, still remains limited with overall mobility. Continue to recommend ST SNF.  Follow Up Recommendations  SNF;Supervision/Assistance - 24 hour     Equipment Recommendations  Other (comment) (TBD)    Recommendations for Other Services       Precautions / Restrictions Precautions Precautions: Fall Precaution Comments: watch O2 saturations and RR Restrictions Weight Bearing Restrictions: No    Mobility  Bed Mobility Overal bed mobility: Needs Assistance Bed Mobility: Supine to Sit     Supine to sit: Min guard     General bed mobility comments: min guard for safety, no physical assist required, cues for attention to lines  Transfers Overall transfer level: Needs assistance Equipment used: 1 person hand held assist Transfers: Sit to/from [facility] Sit to Stand: Min assist         General transfer comment: Min assist for stability in coming to standing, VCs for hand placement and safety  Ambulation/Gait Ambulation/Gait assistance: Min assist Ambulation Distance (Feet): 10 Feet Assistive device: 1 person hand held assist Gait Pattern/deviations: Step-to pattern;Shuffle   Gait velocity interpretation: <1.8 ft/sec, indicative of risk for recurrent falls General Gait Details: steps to chair, limited by dizziness   Stairs            Wheelchair Mobility    Modified Rankin (Stroke Patients Only)       Balance     Sitting balance-Leahy Scale: Fair       Standing balance-Leahy Scale: Poor                      Cognition Arousal/Alertness: Awake/alert Behavior During Therapy: Flat affect Overall Cognitive Status: Impaired/Different from baseline Area of Impairment: Attention;Following commands;Safety/judgement;Awareness;Problem solving   Current Attention Level: Selective   Following Commands: Follows one step commands consistently Safety/Judgement: Decreased awareness of safety;Decreased awareness of deficits Awareness: Intellectual Problem Solving: Slow processing;Decreased initiation;Difficulty sequencing;Requires verbal cues;Requires tactile cues General Comments: patient with some improvements in arousal and attention this session, able to perform functional tasks with less cues    Exercises      General Comments General comments (skin integrity, edema, etc.): VSS throughout session on room 94% saturations, BP 110/59      Pertinent Vitals/Pain Pain Assessment: 0-10 Pain Score: 4  Pain Location: right flank Pain Descriptors / Indicators: Sore Pain Intervention(s): Monitored during session    Home Living                      Prior Function            PT Goals (current goals can now be found in the care plan section) Acute Rehab PT Goals Patient Stated Goal: to not hurt PT Goal Formulation: With patient Time For Goal Achievement: 12/03/15 Potential to Achieve Goals: Fair Progress towards PT goals: Progressing toward goals    Frequency  Min 3X/week    PT Plan Current plan remains appropriate    Co-evaluation  End of Session Equipment Utilized During Treatment: Oxygen Activity Tolerance: Patient limited by fatigue;Patient limited by lethargy;Patient limited by pain (cognition) Patient left: in chair;with call bell/phone within reach     Time: 0832-0850 PT Time Calculation (min) (ACUTE ONLY): 18 min  Charges:   $Therapeutic Activity: 8-22 mins                    G CodesDuncan Dull 12/01/2015, 9:36 AM Alben Deeds, PT DPT  [phone]

## 2015-11-21 NOTE — Clinical Social Work Placement (Signed)
   CLINICAL SOCIAL WORK PLACEMENT  NOTE  Date:  11/21/2015  Patient Details  Name: ANDRELLA KINSMAN MRN: [place]:[id number] Date of Birth: [birth year]  Clinical Social Work is seeking post-discharge placement for this patient at the [place] level of care (*CSW will initial, date and re-position this form in  chart as items are completed):  Yes   Patient/family provided with [place] Work Department's list of facilities offering this level of care within the geographic area requested by the patient (or if unable, by the patient's family).  Yes   Patient/family informed of their freedom to choose among providers that offer the needed level of care, that participate in Medicare, Medicaid or managed care program needed by the patient, have an available bed and are willing to accept the patient.  Yes   Patient/family informed of [place]'s ownership interest in [facility] and [facility], as well as of the fact that they are under no obligation to receive care at these facilities.  PASRR submitted to EDS on 11/20/15     PASRR number received on 11/20/15     Existing PASRR number confirmed on       FL2 transmitted to all facilities in geographic area requested by pt/family on 11/20/15     FL2 transmitted to all facilities within larger geographic area on       Patient informed that his/her managed care company has contracts with or will negotiate with certain facilities, including the following:        Yes   Patient/family informed of bed offers received.  Patient chooses bed at [facility]     Physician recommends and patient chooses bed at [facility]    [name] to [facility] on 11/21/15.  [name] to facility by Ambulance     Patient family notified on 11/21/15 of transfer.  Name of family member notified:  Clair Gulling at bedside     PHYSICIAN Please sign FL2, Please prepare priority discharge summary, including  medications, Please prepare prescriptions     Additional Comment:  Per MD patient ready for DC to [facility]. RN, patient, patient's family, and facility notified of DC. RN given number for report. DC packet on chart. Ambulance transport requested for patient. CSW signing off.   _______________________________________________ Rigoberto Noel, LCSW 11/21/2015, 2:48 PM

## 2015-11-21 NOTE — Discharge Summary (Addendum)
Physician Discharge Summary  Mackenzie Blanchard [id number] DOB: 1941-05-05 DOA: 11/17/2015  PCP: Mathews Argyle, MD  Consultation: none  Admit date: 11/17/2015 Discharge date: 11/21/2015  Recommendations for Outpatient Follow-up:   Follow-up Information    Follow up with Mathews Argyle, MD.   Specialty:  Internal Medicine   Contact information:   [address] [place] [address] [phone]       Follow up with [facility].   Why:  please call [phone] if needed for a follow up   Contact information:   [place] [phone]     Discharge Diagnoses:  1. Fall 2. Multiple right rib fracture 3. Pneumothorax   Surgical Procedure: CT placement   Discharge Condition: stable Disposition: SNF  Diet recommendation: regular  Filed Weights   11/20/15 0600 11/21/15 0600  Weight: 54.341 kg (119 lb 12.8 oz) 54.432 kg (120 lb)    Filed Vitals:   11/21/15 0833 11/21/15 1203  BP: 97/54 123/88  Pulse: 69 76  Temp: 98.2 F (36.8 C) 97.9 F (36.6 C)  Resp: 12 15      Hospital Course:  Mackenzie Blanchard is a 75 year old female with a history of anxiety/depression, hypothyroidism who presented with right rib pain after a fall in the bathroom.  She was found to have multiple right sided rib fractures and a large pneumothorax.  A chest tube was placed in the ED and we were asked to admit for further observation.  The patient was maintained on SCDs and lovenox.  PT and OT consulted for mobility.  Pulmonary toilet was initiated.  Pain medication was adjusted, but had a difficult time controlling her pain without oversedating her.  pneumothorax resolved and a chest tube was removed on 4//21.  A follow up CXR on 4/21 showed a small apical pneumothorax.  The patient was stable.  She was felt stable for discharge to SNF.  Encouraged to continue with mobilization, aggressive pulmonary toilet  and norco if needed for pain. Follow up in our clinic if needed for the rib fractures.   Discharge Instructions     Medication List    STOP taking these medications        ibuprofen 200 MG tablet  Commonly known as:  ADVIL,MOTRIN      TAKE these medications        acetaminophen 325 MG tablet  Commonly known as:  TYLENOL  Take 650 mg by mouth every 6 (six) hours as needed for mild pain or moderate pain.     ALPRAZolam 0.25 MG tablet  Commonly known as:  XANAX  Take 2 tablets (0.5 mg total) by mouth 2 (two) times daily as needed for anxiety.     aspirin 81 MG tablet  Take 81 mg by mouth daily.     cholecalciferol 1000 units tablet  Commonly known as:  VITAMIN D  Take 3,000 Units by mouth daily.     Co Q10 100 MG Caps  Take 400 mg by mouth daily.     DIGESTIVE HEALTH PROBIOTIC PO  Take 1 capsule by mouth daily. FEM DOPHILUS     docusate sodium 100 MG capsule  Commonly known as:  COLACE  Take 1 capsule (100 mg total) by mouth 2 (two) times daily.     ELLURA PO  Take 1 capsule by mouth daily. UTI Prevention     HYDROcodone-acetaminophen 5-325 MG tablet  Commonly known as:  NORCO/VICODIN  Take 0.5-2 tablets by mouth every 4 (four) hours as needed for moderate pain (1/2 tab for mild pain, 1 tab for moderate pain, 2 tabs for severe pain).     levothyroxine 75 MCG tablet  Commonly known as:  SYNTHROID, LEVOTHROID  Take 75 mcg by mouth daily.     loperamide 2 MG capsule  Commonly known as:  IMODIUM  Take 1 capsule (2 mg total) by mouth as needed for diarrhea or loose stools.     Lutein 20 MG Caps  Take 40 mg by mouth daily.     Lycopene 10 MG Caps  Take 20 mg by mouth daily.     Magnesium 400 MG Caps  Take 1 tablet by mouth 3 (three) times daily. 1200mg  daily     Melatonin 5 MG Caps  Take 20 mg by mouth at bedtime.     multivitamin tablet  Take 1 tablet by mouth daily.     polyethylene glycol packet  Commonly known as:  MIRALAX / GLYCOLAX  Take 17 g by  mouth 2 (two) times daily.     Potassium 75 MG Tabs  Take 10 tablets by mouth daily. Taking around 2000mg  daily     traZODone 50 MG tablet  Commonly known as:  DESYREL  Take 50 mg by mouth at bedtime.           Follow-up Information    Follow up with Mathews Argyle, MD.   Specialty:  Internal Medicine   Contact information:   [address] [place] [address] [phone]       Follow up with [facility].   Why:  please call [phone] if needed for a follow up   Contact information:   [place] [phone]       The results of significant diagnostics from this hospitalization (including imaging, microbiology, ancillary and laboratory) are listed below for reference.    Significant Diagnostic Studies: Dg Chest Port 1 View  11/21/2015  CLINICAL DATA:  Traumatic hemo pneumothorax, multiple right rib fractures. EXAM: PORTABLE CHEST 1 VIEW COMPARISON:  Portable chest x-ray of November 20, 2015 FINDINGS: The lungs are well-expanded. There is an approximately 10% right apical pneumothorax. The right apical chest tube has been removed. There is small right pleural effusion. The left lung is well-expanded. There is stable apical pleural thickening on the left as well as basilar atelectasis or scarring. A stable sub cm nodule projects over the left cardiac apex and is stable. The heart and pulmonary vascularity are normal. The mediastinum is normal in width. The trachea is midline. IMPRESSION: There has been interval reaccumulation of hand approximately 10% or less right apical pneumothorax. There is a small right pleural effusion. Stable minimal left basilar atelectasis and nodularity. Critical Value/emergent results were called by telephone at the time of interpretation on 11/21/2015 at 8:00 am to [facility], who verbally acknowledged these results. Electronically Signed   By: David  Martinique  M.D.   On: 11/21/2015 08:02   Dg Chest Port 1 View  11/20/2015  CLINICAL DATA:  Right-sided pneumothorax treated with chest tube ; multiple right-sided rib fractures secondary to a fall EXAM: PORTABLE CHEST 1 VIEW COMPARISON:  Portable chest x-ray of November 19, 2015. FINDINGS: The lungs are adequately inflated. There is bibasilar subsegmental atelectasis. There is no pneumothorax. The right chest tube projects over the posterior medial aspect of the interspace between the third and fourth ribs.  A correction fractures of the fourth and fifth ribs are clearly evident. There may be fractures of the sixth rib laterally. The heart is normal in size. There is tortuosity of the ascending and descending thoracic aorta. IMPRESSION: No recurrent pneumothorax. The right chest tube is in stable position. Persistent bibasilar subsegmental atelectasis with trace bilateral pleural effusions. There is no CHF. Electronically Signed   By: David  Martinique M.D.   On: 11/20/2015 07:41   Dg Chest Port 1 View  11/19/2015  CLINICAL DATA:  Recent hemopneumothorax with right chest tube in place EXAM: PORTABLE CHEST 1 VIEW COMPARISON:  November 18, 2015 FINDINGS: Right-sided chest tube is unchanged in position. Currently no demonstrable pneumothorax. There is atelectatic change in the right lower lobe with small right effusion. There is mild atelectatic change in the left base. There is no airspace consolidation. Heart is upper normal in size with pulmonary vascularity normal. Aorta is mildly prominent but stable. There is atherosclerotic calcification in the aorta. No demonstrable adenopathy. No apparent change in right-sided rib fractures. IMPRESSION: Chest tube on the right unchanged in position without demonstrable pneumothorax. Small right pleural effusion present with patchy bibasilar atelectasis. Stable cardiac silhouette. Electronically Signed   By: Lowella Grip III M.D.   On: 11/19/2015 07:42   Dg Chest Port 1  View  11/18/2015  CLINICAL DATA:  Follow-up exam status post chest tube placement. EXAM: PORTABLE CHEST 1 VIEW COMPARISON:  Prior study from earlier the same day. FINDINGS: Patient is rotated to the right. Cardiac and mediastinal silhouettes are stable in size and contour. There has been interval placement of a large-bore chest tube with tip overlying the right lung apex. Side hole will be on the bony confines of the right hemi thorax. Probable small residual right pneumothorax noted at the right lung apex. Bibasilar atelectatic changes present. No other new acute intracranial process. Multiple right-sided rib fractures again noted. IMPRESSION: 1. Interval placement of right-sided chest tube with tip overlying the right lung apex. Right-sided pneumothorax is markedly improved, with probable small residual right pneumothorax at the right lung apex. 2. Bibasilar atelectasis. 3. Stable right-sided rib fractures. Electronically Signed   By: Jeannine Boga M.D.   On: 11/18/2015 01:17   Dg Chest Portable 1 View  11/18/2015  CLINICAL DATA:  Shortness of breath. Rib fracture. Known right pneumothorax. Fell this morning. EXAM: PORTABLE CHEST 1 VIEW COMPARISON:  11/17/2015 FINDINGS: Large right pneumothorax with collapse of most of the right lung. Multiple acute right rib fractures are again demonstrated. Left lung appears clear and expanded. Normal heart size and pulmonary vascularity. Calcified and tortuous aorta. IMPRESSION: Large right pneumothorax with collapse of the right lung. Multiple right rib fractures. Mild progression since previous study. Electronically Signed   By: Lucienne Capers M.D.   On: 11/18/2015 00:02   Dg Foot Complete Left  11/20/2015  CLINICAL DATA:  Recent fall with persistent left foot pain and swelling over the proximal metatarsal regions. EXAM: LEFT FOOT - COMPLETE 3+ VIEW COMPARISON:  None in PACs FINDINGS: The bones of the left foot are adequately mineralized. The phalanges appear  intact. There is mild narrowing of the interphalangeal joints. The metatarsophalangeal joint spaces are preserved. The metatarsals appear intact. The tarsometatarsal joints exhibit mild degenerative change medially. The tarsal bones are normal. There is mild degenerative narrowing of the intertarsal joint spaces. The soft tissues of the foot exhibit no acute abnormalities. IMPRESSION: There is no acute or healing fracture. There are mild degenerative changes of the  intertarsal and tarsometatarsal joints. Electronically Signed   By: David  Martinique M.D.   On: 11/20/2015 09:10    Microbiology: Recent Results (from the past 240 hour(s))  MRSA PCR Screening     Status: None   Collection Time: 11/18/15  8:02 PM  Result Value Ref Range Status   MRSA by PCR NEGATIVE NEGATIVE Final    Comment:        The GeneXpert MRSA Assay (FDA approved for NASAL specimens only), is one component of a comprehensive MRSA colonization surveillance program. It is not intended to diagnose MRSA infection nor to guide or monitor treatment for MRSA infections.      Labs: Basic Metabolic Panel:  Recent Labs Lab 11/17/15 2310 11/19/15 0506  NA 135 133*  K 3.6 3.5  CL 100* 99*  CO2 26 28  GLUCOSE 103* 94  BUN 12 6  CREATININE 0.56 0.51  CALCIUM 10.1 8.5*   Liver Function Tests: No results for input(s): AST, ALT, ALKPHOS, BILITOT, PROT, ALBUMIN in the last 168 hours. No results for input(s): LIPASE, AMYLASE in the last 168 hours. No results for input(s): AMMONIA in the last 168 hours. CBC:  Recent Labs Lab 11/17/15 2310 11/19/15 0506  WBC 7.7 5.2  HGB 13.1 11.3*  HCT 39.3 35.4*  MCV 92.0 93.7  PLT 232 183   Cardiac Enzymes: No results for input(s): CKTOTAL, CKMB, CKMBINDEX, TROPONINI in the last 168 hours. BNP: BNP (last 3 results)  Recent Labs  11/17/15 2310  BNP 37.1    ProBNP (last 3 results) No results for input(s): PROBNP in the last 8760 hours.  CBG: No results for input(s):  GLUCAP in the last 168 hours.  Active Problems:   Fall   Multiple fractures of ribs of right side   Traumatic pneumothorax   Time coordinating discharge: <30 mins   Signed:  Vassie Kugel, ANP-BC

## 2015-11-21 NOTE — Care Management Note (Signed)
Case Management Note  Patient Details  Name: Mackenzie Blanchard MRN: [id number] Date of Birth: 1941/06/23  Subjective/Objective: Pt admitted for Fall. Plan for SNF today.                    Action/Plan: CSW assisting with disposition needs. No needs from CM at this time.    Expected Discharge Date:                  Expected Discharge Plan:  Skilled Nursing Facility  In-House Referral:  Clinical Social Work  Discharge planning Services  CM Consult  Post Acute Care Choice:  NA Choice offered to:  NA  DME Arranged:  N/A DME Agency:  NA  HH Arranged:  NA HH Agency:  NA  Status of Service:  Completed, signed off  Medicare Important Message Given:  Yes Date Medicare IM Given:    Medicare IM give by:    Date Additional Medicare IM Given:    Additional Medicare Important Message give by:     If discussed at [place] of Stay Meetings, dates discussed:    Additional Comments:  Bethena Roys, RN 11/21/2015, 2:39 PM

## 2015-11-21 NOTE — Progress Notes (Signed)
Patient ID: Mackenzie Blanchard, female   DOB: [birth year], 75 y.o.   MRN: [id number]     [facility]      [id number] [place], [place] [phone]    Phone: [phone] FAX: [phone]     Subjective: Little appetite.  Nauseated.  No abd pain.  No bm or flatus. Had some confusion following tramadol.  Tolerating POs.  Denies sob, c/o pain, but okay with norco.   Objective:  Vital signs:  Filed Vitals:   11/20/15 1947 11/20/15 1950 11/21/15 0022 11/21/15 0600  BP: 128/81  120/65 122/64  Pulse:   84 83  Temp:  98.3 F (36.8 C) 98.1 F (36.7 C) 98.3 F (36.8 C)  TempSrc:  Oral Oral Oral  Resp: 17  18 20   Weight:    54.432 kg (120 lb)  SpO2:  96% 95% 95%    Last BM Date: 11/16/15  Intake/Output   Yesterday:  04/20 0701 - 04/21 0700 In: 360 [P.O.:360] Out: 1000 [Urine:1000] This shift:    I/O last 3 completed shifts: In: 840 [P.O.:840] Out: 2250 [Urine:2250]    Physical Exam: General: Pt awake/alert/oriented x4 in no acute distress Chest: cta.  CV:  Pulses intact.  Regular rhythm Abdomen: Soft.  Nondistended.  Non tender..  No evidence of peritonitis.  No incarcerated hernias. Ext:  SCDs BLE.  No mjr edema.  No cyanosis Skin: No petechiae / purpura   Problem List:   Active Problems:   Fall   Multiple fractures of ribs of right side   Traumatic pneumothorax    Results:   Labs: No results found for this or any previous visit (from the past 48 hour(s)).  Imaging / Studies: Dg Chest Port 1 View  11/21/2015  CLINICAL DATA:  Traumatic hemo pneumothorax, multiple right rib fractures. EXAM: PORTABLE CHEST 1 VIEW COMPARISON:  Portable chest x-ray of November 20, 2015 FINDINGS: The lungs are well-expanded. There is an approximately 10% right apical pneumothorax. The right apical chest tube has been removed. There is small right pleural effusion. The left lung is well-expanded. There is stable apical pleural thickening on the  left as well as basilar atelectasis or scarring. A stable sub cm nodule projects over the left cardiac apex and is stable. The heart and pulmonary vascularity are normal. The mediastinum is normal in width. The trachea is midline. IMPRESSION: There has been interval reaccumulation of hand approximately 10% or less right apical pneumothorax. There is a small right pleural effusion. Stable minimal left basilar atelectasis and nodularity. Critical Value/emergent results were called by telephone at the time of interpretation on 11/21/2015 at 8:00 am to [facility], who verbally acknowledged these results. Electronically Signed   By: David  Martinique M.D.   On: 11/21/2015 08:02   Dg Chest Port 1 View  11/20/2015  CLINICAL DATA:  Right-sided pneumothorax treated with chest tube ; multiple right-sided rib fractures secondary to a fall EXAM: PORTABLE CHEST 1 VIEW COMPARISON:  Portable chest x-ray of November 19, 2015. FINDINGS: The lungs are adequately inflated. There is bibasilar subsegmental atelectasis. There is no pneumothorax. The right chest tube projects over the posterior medial aspect of the interspace between the third and fourth ribs. A correction fractures of the fourth and fifth ribs are clearly evident. There may be fractures of the sixth rib laterally. The heart is normal in size. There is tortuosity of the ascending and descending thoracic aorta. IMPRESSION: No recurrent pneumothorax. The right chest tube  is in stable position. Persistent bibasilar subsegmental atelectasis with trace bilateral pleural effusions. There is no CHF. Electronically Signed   By: David  Martinique M.D.   On: 11/20/2015 07:41   Dg Foot Complete Left  11/20/2015  CLINICAL DATA:  Recent fall with persistent left foot pain and swelling over the proximal metatarsal regions. EXAM: LEFT FOOT - COMPLETE 3+ VIEW COMPARISON:  None in PACs FINDINGS: The bones of the left foot are adequately mineralized. The phalanges appear intact. There is  mild narrowing of the interphalangeal joints. The metatarsophalangeal joint spaces are preserved. The metatarsals appear intact. The tarsometatarsal joints exhibit mild degenerative change medially. The tarsal bones are normal. There is mild degenerative narrowing of the intertarsal joint spaces. The soft tissues of the foot exhibit no acute abnormalities. IMPRESSION: There is no acute or healing fracture. There are mild degenerative changes of the intertarsal and tarsometatarsal joints. Electronically Signed   By: David  Martinique M.D.   On: 11/20/2015 09:10    Medications / Allergies:  Scheduled Meds: . antiseptic oral rinse  7 mL Mouth Rinse BID  . docusate sodium  100 mg Oral BID  . feeding supplement (ENSURE ENLIVE)  237 mL Oral BID BM  . hydrochlorothiazide  25 mg Oral Daily  . levothyroxine  75 mcg Oral Daily  . meloxicam  15 mg Oral Daily  . polyethylene glycol  17 g Oral Daily  . sertraline  25 mg Oral Daily  . traZODone  50 mg Oral QHS   Continuous Infusions:  PRN Meds:.ALPRAZolam, cyclobenzaprine, diphenhydrAMINE, HYDROcodone-acetaminophen, morphine injection, ondansetron **OR** ondansetron (ZOFRAN) IV  Antibiotics: Anti-infectives    None       Assessment/Plan: Fall Multiple right rib fxs w/PTX s/p CT - CT removed 4/21.  Today's CXR shows small apical PTX, will monitor.  Pulling 710ml on IS, encouraged use.  Mutliple medical problems - Home meds FEN - /dc tramadol, c/w norco VTE - SCD's, Lovenox Dispo - SNF   Erby Pian, [employer] Pager [phone](7A-[phone])   11/21/2015 8:14 AM

## 2015-11-21 NOTE — Care Management Important Message (Signed)
Important Message  Patient Details  Name: Mackenzie Blanchard MRN: [id number] Date of Birth: 08-27-1940   Medicare Important Message Given:  Yes    Jeanean Hollett Abena 11/21/2015, 10:30 AM

## 2015-11-22 DIAGNOSIS — M25561 Pain in right knee: Secondary | ICD-10-CM | POA: Diagnosis not present

## 2015-11-22 DIAGNOSIS — K5900 Constipation, unspecified: Secondary | ICD-10-CM | POA: Diagnosis not present

## 2015-11-22 DIAGNOSIS — J939 Pneumothorax, unspecified: Secondary | ICD-10-CM | POA: Diagnosis not present

## 2015-11-22 DIAGNOSIS — E039 Hypothyroidism, unspecified: Secondary | ICD-10-CM | POA: Diagnosis not present

## 2015-11-22 DIAGNOSIS — R419 Unspecified symptoms and signs involving cognitive functions and awareness: Secondary | ICD-10-CM | POA: Diagnosis not present

## 2015-11-22 DIAGNOSIS — S2241XA Multiple fractures of ribs, right side, initial encounter for closed fracture: Secondary | ICD-10-CM | POA: Diagnosis not present

## 2015-11-26 DIAGNOSIS — R197 Diarrhea, unspecified: Secondary | ICD-10-CM | POA: Diagnosis not present

## 2015-12-10 DIAGNOSIS — F419 Anxiety disorder, unspecified: Secondary | ICD-10-CM | POA: Diagnosis not present

## 2015-12-18 DIAGNOSIS — G8921 Chronic pain due to trauma: Secondary | ICD-10-CM | POA: Diagnosis not present

## 2015-12-18 DIAGNOSIS — M6281 Muscle weakness (generalized): Secondary | ICD-10-CM | POA: Diagnosis not present

## 2015-12-18 DIAGNOSIS — S2241XD Multiple fractures of ribs, right side, subsequent encounter for fracture with routine healing: Secondary | ICD-10-CM | POA: Diagnosis not present

## 2015-12-18 DIAGNOSIS — R296 Repeated falls: Secondary | ICD-10-CM | POA: Diagnosis not present

## 2015-12-18 DIAGNOSIS — I2510 Atherosclerotic heart disease of native coronary artery without angina pectoris: Secondary | ICD-10-CM | POA: Diagnosis not present

## 2015-12-18 DIAGNOSIS — R41841 Cognitive communication deficit: Secondary | ICD-10-CM | POA: Diagnosis not present

## 2016-02-11 DIAGNOSIS — H52223 Regular astigmatism, bilateral: Secondary | ICD-10-CM | POA: Diagnosis not present

## 2016-03-01 DIAGNOSIS — F3342 Major depressive disorder, recurrent, in full remission: Secondary | ICD-10-CM | POA: Diagnosis not present

## 2016-04-26 DIAGNOSIS — F3342 Major depressive disorder, recurrent, in full remission: Secondary | ICD-10-CM | POA: Diagnosis not present

## 2016-04-28 DIAGNOSIS — H6122 Impacted cerumen, left ear: Secondary | ICD-10-CM | POA: Diagnosis not present

## 2016-04-28 DIAGNOSIS — R269 Unspecified abnormalities of gait and mobility: Secondary | ICD-10-CM | POA: Diagnosis not present

## 2016-04-28 DIAGNOSIS — E039 Hypothyroidism, unspecified: Secondary | ICD-10-CM | POA: Diagnosis not present

## 2016-04-28 DIAGNOSIS — Z79899 Other long term (current) drug therapy: Secondary | ICD-10-CM | POA: Diagnosis not present

## 2016-04-28 DIAGNOSIS — R5383 Other fatigue: Secondary | ICD-10-CM | POA: Diagnosis not present

## 2016-04-28 DIAGNOSIS — M8588 Other specified disorders of bone density and structure, other site: Secondary | ICD-10-CM | POA: Diagnosis not present

## 2016-04-28 DIAGNOSIS — Z23 Encounter for immunization: Secondary | ICD-10-CM | POA: Diagnosis not present

## 2016-05-17 DIAGNOSIS — M8588 Other specified disorders of bone density and structure, other site: Secondary | ICD-10-CM | POA: Diagnosis not present

## 2016-05-27 DIAGNOSIS — R269 Unspecified abnormalities of gait and mobility: Secondary | ICD-10-CM | POA: Diagnosis not present

## 2016-05-27 DIAGNOSIS — E039 Hypothyroidism, unspecified: Secondary | ICD-10-CM | POA: Diagnosis not present

## 2016-05-27 DIAGNOSIS — I872 Venous insufficiency (chronic) (peripheral): Secondary | ICD-10-CM | POA: Diagnosis not present

## 2016-05-27 DIAGNOSIS — F419 Anxiety disorder, unspecified: Secondary | ICD-10-CM | POA: Diagnosis not present

## 2016-05-27 DIAGNOSIS — F325 Major depressive disorder, single episode, in full remission: Secondary | ICD-10-CM | POA: Diagnosis not present

## 2016-05-27 DIAGNOSIS — Z0000 Encounter for general adult medical examination without abnormal findings: Secondary | ICD-10-CM | POA: Diagnosis not present

## 2016-08-05 DIAGNOSIS — F3342 Major depressive disorder, recurrent, in full remission: Secondary | ICD-10-CM | POA: Diagnosis not present

## 2016-09-28 DIAGNOSIS — J301 Allergic rhinitis due to pollen: Secondary | ICD-10-CM | POA: Diagnosis not present

## 2016-09-28 DIAGNOSIS — M19042 Primary osteoarthritis, left hand: Secondary | ICD-10-CM | POA: Diagnosis not present

## 2016-09-28 DIAGNOSIS — Z79899 Other long term (current) drug therapy: Secondary | ICD-10-CM | POA: Diagnosis not present

## 2016-09-28 DIAGNOSIS — M19041 Primary osteoarthritis, right hand: Secondary | ICD-10-CM | POA: Diagnosis not present

## 2017-01-03 DIAGNOSIS — F3342 Major depressive disorder, recurrent, in full remission: Secondary | ICD-10-CM | POA: Diagnosis not present

## 2017-01-19 ENCOUNTER — Encounter: Payer: Self-pay | Admitting: Physical Therapy

## 2017-01-19 ENCOUNTER — Ambulatory Visit: Payer: Medicare Other | Attending: Geriatric Medicine | Admitting: Physical Therapy

## 2017-01-19 DIAGNOSIS — R296 Repeated falls: Secondary | ICD-10-CM | POA: Diagnosis present

## 2017-01-19 DIAGNOSIS — M6281 Muscle weakness (generalized): Secondary | ICD-10-CM | POA: Diagnosis present

## 2017-01-19 DIAGNOSIS — R42 Dizziness and giddiness: Secondary | ICD-10-CM | POA: Insufficient documentation

## 2017-01-19 DIAGNOSIS — R2689 Other abnormalities of gait and mobility: Secondary | ICD-10-CM | POA: Insufficient documentation

## 2017-01-19 DIAGNOSIS — R2681 Unsteadiness on feet: Secondary | ICD-10-CM

## 2017-01-19 NOTE — Therapy (Signed)
[facility] [address] [place], [place], [address] Phone: [phone]   Fax:  [phone]  Physical Therapy Evaluation  Patient Details  Name: Mackenzie Blanchard MRN: [id number] Date of Birth: [birth year] Referring Provider: Lajean Manes MD   Encounter Date: 01/19/2017      PT End of Session - 01/19/17 1453    Visit Number 1   Number of Visits 17   Date for PT Re-Evaluation 03/20/17   Authorization Type Medicare and G-codes every 10th visit    PT Start Time 1316   PT Stop Time 1400   PT Time Calculation (min) 44 min   Equipment Utilized During Treatment Gait belt   Activity Tolerance Patient tolerated treatment well   Behavior During Therapy [facility] for tasks assessed/performed      Past Medical History:  Diagnosis Date  . Anxiety   . Arthritis    "neck and upper back" (11/18/2015)  . Chronic right shoulder pain    w/limited ROM (11/18/2015)  . Colon cancer ([place])   . Depression   . Edema    idiopathic  . Fall at home 11/18/2015   right rib fractures with pneumothorax  . Hashimoto's disease   . Hyperlipidemia   . Hypothyroidism   . Osteopenia   . Sinusitis, chronic     Past Surgical History:  Procedure Laterality Date  . CATARACT EXTRACTION W/ INTRAOCULAR LENS  IMPLANT, BILATERAL Bilateral    bilaterally  . COLECTOMY  05/2013  . COLONOSCOPY WITH PROPOFOL N/A 11/16/2012   Procedure: COLONOSCOPY WITH PROPOFOL;  Surgeon: Garlan Fair, MD;  Location: [facility];  Service: Endoscopy;  Laterality: N/A;  h&p in file-[place]  . TONSILLECTOMY    . TUBAL LIGATION      There were no vitals filed for this visit.       Subjective Assessment - 01/19/17 1324    Subjective Patient is a 76 year old female presenting to [facility] with a RW and caregiver Maudie Mercury) due to noted decrease in balance and gait instability. Patient reports she "woke up" with decreased balance in 2009. She reports her balance and gait have become  progressively worse since that time. Patient reports she feels intermittently lightheaded. Denies orthostatic description associated with lightheadedness, and reports it can happen at any time. Patient has been using RW as primary assistive device outside the house since a fall resulting in a pneumothorax and multiple R rib fractures in April 2017. Patient reports she often tends to her lawn late at night to avoid the heat, and reports several falls outside. Patient reports she has also had several falls inside house, but patient and caregiver report patient often does not utilize RW when in the house. Patient's caregiver is present 2x/day for approximately 1-2 hours each time. Patient has lifeline.    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 Yes   Pain Score 5    Pain Location Neck   Pain Orientation Mid   Pain Type Chronic pain   Pain Onset More than a month ago   Pain Frequency Constant   Multiple Pain Sites --  [employer] PT Assessment - 01/19/17 1320      Assessment   Medical Diagnosis Gait disorder    Referring Provider Lajean Manes MD    Onset Date/Surgical Date 01/19/17  PT referral    Hand Dominance Left     Precautions   Precautions Fall     Balance Screen   Has the patient fallen in the past 6 months Yes   How many times? 30  approximately 5 falls/month    Has the patient had a decrease in activity level because of a fear of falling?  Yes   Is the patient reluctant to leave their home because of a fear of falling?  Yes     [place] Private residence   Living  Arrangements Alone;Other (Comment)  with caregiver present 2x/d for approx 1-2hrs each time    Type of [place] to enter   Entrance Stairs-Number of Steps 3   Entrance Stairs-Rails Right;Left;Cannot reach both   Friend One level   [place] - 2 wheels;Grab bars - tub/shower     Prior Function   Level of Independence Independent with household mobility without device;Independent with community mobility with device   Leisure working in yard/garden      Observation/Other Assessments   Focus on Therapeutic Outcomes (FOTO)  28.94 Functional Status   Activities of Balance Confidence Scale (ABC Scale)  9.4%     Posture/Postural Control   Posture/Postural Control Postural limitations   Postural Limitations Rounded Shoulders;Forward head;Increased thoracic kyphosis;Flexed trunk     ROM / Strength   AROM / PROM / Strength Strength     Strength   Overall Strength Deficits   Strength Assessment Site Hip;Knee;Ankle   Right/Left Hip Right;Left   Right Hip Flexion 5/5   Left Hip Flexion 4/5   Right/Left Knee Right;Left   Right Knee Extension --  >3/5   Left Knee Flexion 5/5   Left Knee Extension 5/5   Right/Left Ankle Right;Left   Right Ankle Dorsiflexion 4+/5   Left Ankle Dorsiflexion 5/5     Transfers   Transfers Sit to Stand;Stand to Sit   Sit to Stand 5: Supervision;With upper extremity assist;With armrests;From chair/3-in-1   Stand to Sit 5: Supervision;With upper extremity assist;With armrests;To chair/3-in-1     Ambulation/Gait   Ambulation/Gait Yes   Ambulation/Gait Assistance 4: Min guard;4: Min assist  min guard with RW; min A without AD   Ambulation/Gait Assistance Details Without an assistive device, patient demonstrates increased poor foot clearance, shortened step length and shuffles feet, mild B knee flexion in stance   Ambulation Distance (Feet) 60 Feet   Assistive device Rolling walker;None   Gait Pattern Step-through  pattern;Decreased step length - left;Decreased stance time - right;Decreased stride length;Decreased weight shift to right;Trunk flexed  deficits are increased without device compared to RW   Ambulation Surface Level;Indoor   Gait velocity 1.93ft/s with RW; 1.34ft/s without AD      Standardized Balance Assessment   Standardized Balance Assessment Berg Balance Test;Timed Up and Go Test;10 meter walk test     Berg Balance Test   Sit to Stand Able to stand  independently using hands   Standing Unsupported Able to stand safely 2 minutes   Sitting with Back Unsupported but Feet Supported on Floor or Stool Able to sit safely and securely 2 minutes   Stand to Sit Controls descent by using hands   Transfers Able to transfer safely,  definite need of hands   Standing Unsupported with Eyes Closed Able to stand 10 seconds with supervision   Standing Ubsupported with Feet Together Able to place feet together independently and stand for 1 minute with supervision   From Standing, Reach Forward with Outstretched Arm Can reach forward >5 cm safely (2")   From Standing Position, Pick up Object from [place] to pick up shoe, needs supervision   From Standing Position, Turn to Look Behind Over each Shoulder Needs supervision when turning   Turn 360 Degrees Needs assistance while turning   Standing Unsupported, Alternately Place Feet on Step/Stool Needs assistance to keep from falling or unable to try   Standing Unsupported, One Foot in [place] help to step but can hold 15 seconds   Standing on One Leg Tries to lift leg/unable to hold 3 seconds but remains standing independently   Total Score 31     Timed Up and Go Test   Normal TUG (seconds) 30.85  30.85s with RW; 24.43s without RW    TUG Comments Patient's TUG score without RW is faster, but required min A from PT for safety             Objective measurements completed on examination: See above findings.     Vital Signs  At rest:  SpO2=95%; HR = 81bpm; RR = 14; Dyspnea = took patient 3 breaths to count to 15 PT monitored patient's dyspnea throughout session, and it remained at a level requiring her to take 2-3 breaths while counting to 15 throughout all activities in PT evaluation. PT monitored patient's dyspnea due to noting shortness of breath during conversation with patient.   Patient reported"light-headedness" and off balance that would benefit from vestibular assessment.           PT Education - 01/19/17 1452    Education provided Yes   Education Details plan of care    Person(s) Educated Patient;Caregiver(s)  caregiver - Kim    Methods Explanation   Comprehension Verbalized understanding          PT Short Term Goals - 01/19/17 1523      PT SHORT TERM GOAL #1   Title Patient will verbalize understanding and return demonstration for initial HEP to increase LE strength and balance to reduce risk of falling. (TARGET DATE: 02/18/2017)    Time 4   Period Weeks   Status New     PT SHORT TERM GOAL #2   Title Patient's gait speed will be >/= 1.66ft/s to indicate a decresae in risk of falling. (TARGET DATE: 02/18/2017)    Time 4   Period Weeks   Status New     PT SHORT TERM GOAL #3   Title Patient's cognitive TUG score will be </=20% greater than standard TUG to indicate a decrease in risk of falling while completing attention demanding tasks. (TARGET DATE: 02/18/2017)    Time 4   Period Weeks   Status New     PT SHORT TERM GOAL #4   Title PT will perform 6 minute walk test, and patient will score >/= 40ft more than her baseline measurement to indicate improvement in her endurance. (TARGET DATE: 02/18/2017)    Time 4   Period Weeks   Status New     PT SHORT TERM GOAL #5   Title Patient will demonstrate ability to ambulate on stairs (2 rails), ramp/curb with min guard to indicate a decrease in risk of falling. (TARGET DATE: 02/18/2017)  Time 4   Period Weeks   Status New     Additional Short Term  Goals   Additional Short Term Goals Yes     PT SHORT TERM GOAL #6   Title Patient will demonstrate ability to ambulate 227ft with RW with supervision to indicate a decrease in risk of falling. (TARGET DATE: 02/18/2017)    Time 4   Period Weeks   Status New           PT Long Term Goals - 01/19/17 1510      PT LONG TERM GOAL #1   Title Patient will verbalize understanding and return demonstration for initial HEP to increase LE strength and balance to decrease risk of falling. (TARGET DATE: 03/18/2017)    Time 8   Period Weeks   Status New     PT LONG TERM GOAL #2   Title Patient's gait speed will be >/= 1.57ft/s with RW to indicate a decresae in her risk of repeated falls. (TARGET DATE: 03/18/2017)    Time 8   Period Weeks   Status New     PT LONG TERM GOAL #3   Title Patient's cognitive TUG score will be </= 10% greater than standard TUG score to indicate a decrease in risk of falling when completing attention demanding tasks. (TARGET DATE: 03/18/2017)    Time 8   Period Weeks   Status New     PT LONG TERM GOAL #4   Title Patient will score >/= 40/56 on the Berg Balance Test to indicate a decrease in risk of falling. (TARGET DATE: 03/18/2017)    Time 8   Period Weeks   Status New     PT LONG TERM GOAL #5   Title Patient will improve her ABC (Activities of Balance Confidence) FOTO score by >/=10% to indicate improvement in self-perceived functional mobility. (TARGET DATE: 03/18/2017)    Time 8   Period Weeks   Status New     PT LONG TERM GOAL #6   Title Patient will demonstrate ability to ambulte on stairs (1 rail), ramp, curb with supervision and LRAD to indicate a decrease in risk of falling when returning to full community ambulation. (TARGET DATE: 03/18/2017)      PT LONG TERM GOAL #7   Title Patient will be able to ambulate 561ft including outdoor surfaces with LRAD and supervision to indicate a decrease in risk of falling when ambulating in the yard and community.  (TARGET DATE: 03/18/2017)    Time 8   Period Weeks   Status New     PT LONG TERM GOAL #8   Title Patient will demonstrate ability to lift/carry objects (simulating lawn care materials) on outdoor (grass/gravel) surfaces for 28ft with supervision to indicate a decrease in risk of falling when performing lawn care work. (TARGET DATE: 03/18/2017)    Time 8   Period Weeks   Status New     PT LONG TERM GOAL  #9   TITLE PT will perform 6 minute walk test, and patient will improve by >/=153ft from baseline measurement with stable vital signs to indicate improvment in endurance. (TARGET DATE: 03/18/2017)    Time 8   Period Weeks   Status New                Plan - 01/19/17 1503    Clinical Impression Statement Patient is a 76 year old female presenting to [facility] with an evolving clinical presentation for a moderate complexity physical therapy evaluation  due to an increase in imbalance and gait instability. The following deficits were noted during the patient's exam: decreased strength in BLEs placing the patient at an increased risk for falling, decreased functional strength demonstrated as an increased reliance on UE support to perform transfers and maintain balance during standing activities placing the patient at an increased risk for falling, and shortness of breath during seated talking activities indicating a decreased activity tolerance and an increased risk for falling. Patient has a history of falling and reports a fear of falling, which both increase her risk for future falls. The patient's Berg Balance score of 31/56, TUG score of 30.85s with a RW, and gait velocity of 1.50ft/s with a RW all indicate that the patient is at an increased risk for repeated falls. Patient would benefit from skilled PT to address these impairments and functional limitations to maximize functional mobility independence and reduce falls risk.    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     Clinical Presentation Evolving   Clinical Presentation due to: progressive decline in gait and balance    Clinical Decision Making Moderate   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     PT Frequency 2x / week   PT Duration 8 weeks   PT Treatment/Interventions ADLs/Self Care Home Management;Neuromuscular re-education;Balance training;Therapeutic exercise;Therapeutic activities;Functional mobility training;Stair training;Gait training;DME Instruction;Patient/family education;Energy conservation   PT Next Visit Plan Vestibular Assessment, perform TUG & cognitive TUG & 6 Minute Walk Test; initiate HEP for LE strengthening and balance    Consulted and Agree with Plan of Care Family member/caregiver;Patient   Family Member Consulted caregiver - Kim       Patient will benefit from skilled therapeutic intervention in order to improve the following deficits and impairments:  Abnormal gait, Decreased activity tolerance, Decreased balance, Decreased coordination, Decreased safety awareness, Decreased range of motion, Decreased mobility, Decreased knowledge of use of DME, Decreased endurance, Decreased strength, Difficulty walking, Dizziness, Cardiopulmonary status limiting activity  Visit Diagnosis: Unsteadiness on feet  Repeated falls  Other abnormalities of gait and mobility  Muscle weakness (generalized)  Dizziness and giddiness      G-Codes - 01-22-17 1539    Functional Assessment Tool Used (Outpatient Only) Timed Up & Go with rolling walker 30.85sec and without device  24.43sec unstable/minA    Functional Limitation Mobility: Walking and moving around   Mobility: Walking and Moving Around Current Status (A3557) At least 60 percent but less than 80 percent impaired, limited or restricted   Mobility: Walking and Moving Around Goal Status (D2202) At least 40 percent but less than 60 percent impaired, limited or restricted       Problem List Patient Active Problem List   Diagnosis Date Noted  . Fall 11/18/2015  . Multiple fractures of ribs of right side 11/18/2015  . Traumatic pneumothorax 11/18/2015  . Muscle spasms of neck 03/23/2015  . Acute encephalopathy 03/22/2015  . UTI (lower urinary tract infection) 03/22/2015  . Anxiety 03/22/2015  . Hyponatremia 03/22/2015  . Gait abnormality 05/11/2012  . Weakness 05/11/2012   Arelia Sneddon, SPT 01/20/2017, 8:19 AM  Jamey Reas, PT, DPT  01/20/2017, 3:41 PM  [facility] [address] [place], [place], [address] Phone: [phone]   Fax:  [phone]  Name: Mackenzie Blanchard MRN: [id number] Date of Birth: June 25, 1941

## 2017-02-08 ENCOUNTER — Ambulatory Visit: Payer: Medicare Other | Attending: Geriatric Medicine

## 2017-02-08 DIAGNOSIS — M6281 Muscle weakness (generalized): Secondary | ICD-10-CM | POA: Insufficient documentation

## 2017-02-08 DIAGNOSIS — R2681 Unsteadiness on feet: Secondary | ICD-10-CM | POA: Diagnosis not present

## 2017-02-08 DIAGNOSIS — R42 Dizziness and giddiness: Secondary | ICD-10-CM | POA: Insufficient documentation

## 2017-02-08 DIAGNOSIS — R2689 Other abnormalities of gait and mobility: Secondary | ICD-10-CM

## 2017-02-08 DIAGNOSIS — R296 Repeated falls: Secondary | ICD-10-CM | POA: Insufficient documentation

## 2017-02-08 NOTE — Therapy (Signed)
[facility] [address] [place], [place], [address] Phone: [phone]   Fax:  [phone]  Physical Therapy Treatment  Patient Details  Name: Mackenzie Blanchard MRN: [id number] Date of Birth: 08-21-1940 Referring Provider: Lajean Manes MD   Encounter Date: 02/08/2017      PT End of Session - 02/08/17 1315    Visit Number 2   Number of Visits 17   Date for PT Re-Evaluation 03/20/17   Authorization Type Medicare and G-codes every 10th visit    PT Start Time 1150   PT Stop Time 1228   PT Time Calculation (min) 38 min   Equipment Utilized During Treatment --  min guard   Activity Tolerance Patient tolerated treatment well   Behavior During Therapy [facility] for tasks assessed/performed      Past Medical History:  Diagnosis Date  . Anxiety   . Arthritis    "neck and upper back" (11/18/2015)  . Chronic right shoulder pain    w/limited ROM (11/18/2015)  . Colon cancer ([place])   . Depression   . Edema    idiopathic  . Fall at home 11/18/2015   right rib fractures with pneumothorax  . Hashimoto's disease   . Hyperlipidemia   . Hypothyroidism   . Osteopenia   . Sinusitis, chronic     Past Surgical History:  Procedure Laterality Date  . CATARACT EXTRACTION W/ INTRAOCULAR LENS  IMPLANT, BILATERAL Bilateral    bilaterally  . COLECTOMY  05/2013  . COLONOSCOPY WITH PROPOFOL N/A 11/16/2012   Procedure: COLONOSCOPY WITH PROPOFOL;  Surgeon: Garlan Fair, MD;  Location: [facility];  Service: Endoscopy;  Laterality: N/A;  h&p in file-[facility]  . TONSILLECTOMY    . TUBAL LIGATION      There were no vitals filed for this visit.      Subjective Assessment - 02/08/17 1154    Subjective Pt reported she fell in the driveway, while reaching for a toy. Pt hit head and buttocks/R shoulder. Pt reported she's experiencing R hand N/T since fall and there is blood coming out of her nose. Pt states she refuses to go to MD.   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 Yes   Pain Score --  1-2/10   Pain Location Shoulder   Pain Orientation Right   Pain Descriptors / Indicators Aching   Pain Type Chronic pain   Pain Onset More than a month ago  with recent fall incr. pain   Pain Frequency Constant   Aggravating Factors  movements   Pain Relieving Factors rest            [facility] PT Assessment - 02/08/17 1215      6 Minute Walk- Baseline   6 Minute Walk- Baseline yes   BP (mmHg) 107/67   HR (bpm) 85   02 Sat (%RA) 96 %   Modified Borg Scale for Dyspnea 2- Mild shortness of breath   Perceived Rate of Exertion (Borg) 13- Somewhat hard  pt reports getting dressed/showering exerts energy     6 Minute walk- Post Test   6 Minute Walk Post Test yes   BP (mmHg) 123/75   HR (bpm) 90   02 Sat (%RA) 92 %  Modified Borg Scale for Dyspnea 2- Mild shortness of breath   Perceived Rate of Exertion (Borg) 11- Fairly light  pt stated walking is less difficult than dressing/bathing     6 minute walk test results    Aerobic Endurance Distance Walked 579   Endurance additional comments With min guard 2/2 pt experiencing difficulty negotiating obstacles.                     [facility] Adult PT Treatment/Exercise - 02/08/17 1212      Standardized Balance Assessment   Standardized Balance Assessment Timed Up and Go Test     Timed Up and Go Test   TUG Cognitive TUG;Normal TUG   Normal TUG (seconds) 30.2  with RW   Cognitive TUG (seconds) 31.2  with RW     Self-Care   Self-Care Other Self-Care Comments   Other Self-Care Comments  PT spent extensive time assessing pt's back/hips for pain. Which pt reported was deep but getting better s/p fall. PT spent  extensive time educating pt on the importance of seeing MD after severe fall, when hitting head/bones to ensure no fx's or bleeds. PT educated pt on using RW at all times for safety and to not amb. over uneven terrain at this time.                 PT Education - 02/08/17 1314    Education provided Yes   Education Details See self care. PT encouraged pt to go to MD based on sx's after fall. Pt refused but gave permission for PT to send note to MD via EPIC. PT also educated pt that therapy would cease if N/T, weakness, incr. pain, HA occurred.    Person(s) Educated Patient;Caregiver(s)  Mackenzie Blanchard   Methods Explanation   Comprehension Verbalized understanding;Need further instruction          PT Short Term Goals - 02/08/17 1317      PT SHORT TERM GOAL #1   Title Patient will verbalize understanding and return demonstration for initial HEP to increase LE strength and balance to reduce risk of falling. (TARGET DATE: 02/18/2017)    Time 4   Period Weeks   Status New     PT SHORT TERM GOAL #2   Title Patient's gait speed will be >/= 1.78ft/s to indicate a decresae in risk of falling. (TARGET DATE: 02/18/2017)    Time 4   Period Weeks   Status New     PT SHORT TERM GOAL #3   Title Patient's cognitive TUG score will be </=20% greater than standard TUG to indicate a decrease in risk of falling while completing attention demanding tasks. (TARGET DATE: 02/18/2017)    Baseline normal: 30.2 sec.  and cognitive: 31.2 sec.   Time 4   Period Weeks   Status New     PT SHORT TERM GOAL #4   Title PT will perform 6 minute walk test, and patient will score >/= 32ft more than her baseline measurement to indicate improvement in her endurance. (TARGET DATE: 02/18/2017)    Baseline 579'   Time 4   Period Weeks   Status New     PT SHORT TERM GOAL #5   Title Patient will demonstrate ability to ambulate on stairs (2 rails), ramp/curb with min guard to indicate a decrease in risk of falling. (TARGET  DATE: 02/18/2017)    Time 4   Period Weeks   Status New     PT SHORT TERM  GOAL #6   Title Patient will demonstrate ability to ambulate 226ft with RW with supervision to indicate a decrease in risk of falling. (TARGET DATE: 02/18/2017)    Time 4   Period Weeks   Status New           PT Long Term Goals - 02/08/17 1317      PT LONG TERM GOAL #1   Title Patient will verbalize understanding and return demonstration for initial HEP to increase LE strength and balance to decrease risk of falling. (TARGET DATE: 03/18/2017)    Time 8   Period Weeks   Status New     PT LONG TERM GOAL #2   Title Patient's gait speed will be >/= 1.50ft/s with RW to indicate a decresae in her risk of repeated falls. (TARGET DATE: 03/18/2017)    Time 8   Period Weeks   Status New     PT LONG TERM GOAL #3   Title Patient's cognitive TUG score will be </= 10% greater than standard TUG score to indicate a decrease in risk of falling when completing attention demanding tasks. (TARGET DATE: 03/18/2017)    Baseline normal: 30.2 sec. and cognitive: 31.2 sec.   Time 8   Period Weeks   Status New     PT LONG TERM GOAL #4   Title Patient will score >/= 40/56 on the Berg Balance Test to indicate a decrease in risk of falling. (TARGET DATE: 03/18/2017)    Time 8   Period Weeks   Status New     PT LONG TERM GOAL #5   Title Patient will improve her ABC (Activities of Balance Confidence) FOTO score by >/=10% to indicate improvement in self-perceived functional mobility. (TARGET DATE: 03/18/2017)    Time 8   Period Weeks   Status New     PT LONG TERM GOAL #6   Title Patient will demonstrate ability to ambulte on stairs (1 rail), ramp, curb with supervision and LRAD to indicate a decrease in risk of falling when returning to full community ambulation. (TARGET DATE: 03/18/2017)      PT LONG TERM GOAL #7   Title Patient will be able to ambulate 534ft including outdoor surfaces with LRAD and supervision to indicate a  decrease in risk of falling when ambulating in the yard and community. (TARGET DATE: 03/18/2017)    Time 8   Period Weeks   Status New     PT LONG TERM GOAL #8   Title Patient will demonstrate ability to lift/carry objects (simulating lawn care materials) on outdoor (grass/gravel) surfaces for 33ft with supervision to indicate a decrease in risk of falling when performing lawn care work. (TARGET DATE: 03/18/2017)    Time 8   Period Weeks   Status New     PT LONG TERM GOAL  #9   TITLE PT will perform 6 minute walk test, and patient will improve by >/=131ft from baseline measurement with stable vital signs to indicate improvment in endurance. (TARGET DATE: 03/18/2017)    Baseline 579'   Time 8   Period Weeks   Status New               Plan - 02/08/17 1315    Clinical Impression Statement Extensive time spent on self care/education today regarding pt falling and refusing to see MD. Please see education and self care for details. Pt's normal and cognitive TUG time indicate pt is at risk for falls. Pt's 6MWT distance is below normal  for her age group. Pt required rest breaks 2/2 fatigue. PT will continue to assess pt's sx's after fall and cease PT if necessary. Pt would benefit from skilled PT to improve safety during functional mobility.    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     PT Frequency 2x / week   PT Duration 8 weeks   PT Treatment/Interventions ADLs/Self Care Home Management;Neuromuscular re-education;Balance training;Therapeutic exercise;Therapeutic activities;Functional mobility training;Stair training;Gait training;DME Instruction;Patient/family education;Energy conservation   PT Next Visit Plan Vestibular Assessment, initiate HEP for LE  strengthening and balance    Consulted and Agree with Plan of Care Family member/caregiver;Patient   Family Member Consulted caregiver - Mackenzie Blanchard       Patient will benefit from skilled therapeutic intervention in order to improve the following deficits and impairments:  Abnormal gait, Decreased activity tolerance, Decreased balance, Decreased coordination, Decreased safety awareness, Decreased range of motion, Decreased mobility, Decreased knowledge of use of DME, Decreased endurance, Decreased strength, Difficulty walking, Dizziness, Cardiopulmonary status limiting activity  Visit Diagnosis: Other abnormalities of gait and mobility  Unsteadiness on feet     Problem List Patient Active Problem List   Diagnosis Date Noted  . Fall 11/18/2015  . Multiple fractures of ribs of right side 11/18/2015  . Traumatic pneumothorax 11/18/2015  . Muscle spasms of neck 03/23/2015  . Acute encephalopathy 03/22/2015  . UTI (lower urinary tract infection) 03/22/2015  . Anxiety 03/22/2015  . Hyponatremia 03/22/2015  . Gait abnormality 05/11/2012  . Weakness 05/11/2012    Blanchard,Mackenzie L 02/08/2017, 1:19 PM  [facility] [address] [place], [place], [address] Phone: [phone]   Fax:  [phone]  Name: Mackenzie Blanchard MRN: [id number] Date of Birth: 1941/06/21  Geoffry Paradise, PT,DPT 02/08/17 1:19 PM Phone: [phone] Fax: [phone]

## 2017-02-11 ENCOUNTER — Ambulatory Visit: Payer: Medicare Other

## 2017-02-11 DIAGNOSIS — R2689 Other abnormalities of gait and mobility: Secondary | ICD-10-CM

## 2017-02-11 DIAGNOSIS — R42 Dizziness and giddiness: Secondary | ICD-10-CM

## 2017-02-11 NOTE — Therapy (Signed)
[facility] [address] [place], [place], [address] Phone: [phone]   Fax:  [phone]  Physical Therapy Treatment  Patient Details  Name: Mackenzie Blanchard MRN: [id number] Date of Birth: Feb 09, 1941 Referring Provider: Lajean Manes MD   Encounter Date: 02/11/2017      PT End of Session - 02/11/17 1403    Visit Number 3   Number of Visits 17   Date for PT Re-Evaluation 03/20/17   Authorization Type Medicare and G-codes every 10th visit    PT Start Time 4818   PT Stop Time 1400   PT Time Calculation (min) 43 min   Activity Tolerance Patient tolerated treatment well   Behavior During Therapy [facility] for tasks assessed/performed      Past Medical History:  Diagnosis Date  . Anxiety   . Arthritis    "neck and upper back" (11/18/2015)  . Chronic right shoulder pain    w/limited ROM (11/18/2015)  . Colon cancer ([place])   . Depression   . Edema    idiopathic  . Fall at home 11/18/2015   right rib fractures with pneumothorax  . Hashimoto's disease   . Hyperlipidemia   . Hypothyroidism   . Osteopenia   . Sinusitis, chronic     Past Surgical History:  Procedure Laterality Date  . CATARACT EXTRACTION W/ INTRAOCULAR LENS  IMPLANT, BILATERAL Bilateral    bilaterally  . COLECTOMY  05/2013  . COLONOSCOPY WITH PROPOFOL N/A 11/16/2012   Procedure: COLONOSCOPY WITH PROPOFOL;  Surgeon: Garlan Fair, MD;  [facility];  Service: Endoscopy;  Laterality: N/A;  h&p in file-[facility]  . TONSILLECTOMY    . TUBAL LIGATION      There were no vitals filed for this visit.      Subjective Assessment - 02/11/17 1320    Subjective Pt denied falls or changes since last visit. Pt denied dizziness at rest but states dizziness is getting worse.   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 Yes   Pain Score 3   3/10 during amb   Pain Location Back   Pain Orientation Lower;Right   Pain Type Chronic pain   Pain Onset More than a month ago  with incr. with recent fall   Pain Frequency Constant   Aggravating Factors  walking   Pain Relieving Factors rest                 Vestibular Assessment - 02/11/17 1322      Vestibular Assessment   General Observation PT reported dizziness started about 10 years ago. Pt reported she noticed dizziness when she started falling. Pt denied N/T, HA, or weakness associated with dizziness.     Symptom Behavior   Type of Dizziness Lightheadedness   Frequency of Dizziness Daily   Duration of Dizziness Pt is unsure (maybe a few minutes)   Aggravating Factors Turning head quickly;Turning body quickly;Looking up to the ceiling;Sit to stand;Supine to sit  worse when tired   Relieving Factors No known relieving factors     Occulomotor Exam   Occulomotor Alignment Normal   Spontaneous Absent   Gaze-induced Absent   Smooth Pursuits Intact  Pt reported dizziness during  R sided smooth pursuits   Saccades Slow  pt reported dizziness during R and L saccades   Comment Unable to perform B HIT as pt reported lightheadedness and was unable to relax in order for PT to test.      Vestibulo-Occular Reflex   VOR 1 Head Only (x 1 viewing) Pt reported dizziness and nausea during VOR and had difficulty performing.   VOR Cancellation Comment  pt had difficulty following commands     Positional Testing   Sidelying Test Sidelying Right;Sidelying Left   Horizontal Canal Testing Horizontal Canal Right;Horizontal Canal Left     Sidelying Right   Sidelying Right Duration none   Sidelying Right Symptoms No nystagmus     Sidelying Left   Sidelying Left Duration none    Sidelying Left Symptoms No nystagmus     Horizontal Canal Right   Horizontal Canal Right Duration Pt reported lightheadedness.   Horizontal Canal Right Symptoms Normal     Horizontal Canal Left   Horizontal Canal Left Duration Pt reported lightheadedness.   Horizontal Canal Left Symptoms Normal     Positional Sensitivities   Sit to Supine Lightheadedness   Positional Sensitivities Comments Returning from R and L sidelying pt reported lightheadedness.     Orthostatics   BP supine (x 5 minutes) 115/67   HR supine (x 5 minutes) 73   BP sitting 119/68  pt denied lightheadedness   HR sitting 83   BP standing (after 1 minute) 107/65  pt reported lightheadedness upon standing   HR standing (after 1 minute) 82                         PT Education - 02/11/17 1403    Education provided Yes   Education Details PT discussed exam findings during vestibular exam.   Person(s) Educated Patient;Caregiver(s)   Methods Explanation   Comprehension Verbalized understanding;Need further instruction          PT Short Term Goals - 02/08/17 1317      PT SHORT TERM GOAL #1   Title Patient will verbalize understanding and return demonstration for initial HEP to increase LE strength and balance to reduce risk of falling. (TARGET DATE: 02/18/2017)    Time 4   Period Weeks   Status New     PT SHORT TERM GOAL #2   Title Patient's gait speed will be >/= 1.73ft/s to indicate a decresae in risk of falling. (TARGET DATE: 02/18/2017)    Time 4   Period Weeks   Status New     PT SHORT TERM GOAL #3   Title Patient's cognitive TUG score will be </=20% greater than standard TUG to indicate a decrease in risk of falling while completing attention demanding tasks. (TARGET DATE: 02/18/2017)    Baseline normal: 30.2 sec.  and cognitive: 31.2 sec.   Time 4   Period Weeks   Status New     PT SHORT TERM GOAL #4   Title PT will perform 6 minute walk test, and patient will score >/= 70ft more  than her baseline measurement to indicate improvement in her endurance. (TARGET DATE: 02/18/2017)    Baseline 579'   Time 4   Period Weeks   Status New     PT SHORT TERM GOAL #5   Title Patient will demonstrate ability to ambulate on stairs (2 rails), ramp/curb with min guard to indicate a decrease in risk of falling. (TARGET DATE: 02/18/2017)  Time 4   Period Weeks   Status New     PT SHORT TERM GOAL #6   Title Patient will demonstrate ability to ambulate 292ft with RW with supervision to indicate a decrease in risk of falling. (TARGET DATE: 02/18/2017)    Time 4   Period Weeks   Status New           PT Long Term Goals - 02/08/17 1317      PT LONG TERM GOAL #1   Title Patient will verbalize understanding and return demonstration for initial HEP to increase LE strength and balance to decrease risk of falling. (TARGET DATE: 03/18/2017)    Time 8   Period Weeks   Status New     PT LONG TERM GOAL #2   Title Patient's gait speed will be >/= 1.75ft/s with RW to indicate a decresae in her risk of repeated falls. (TARGET DATE: 03/18/2017)    Time 8   Period Weeks   Status New     PT LONG TERM GOAL #3   Title Patient's cognitive TUG score will be </= 10% greater than standard TUG score to indicate a decrease in risk of falling when completing attention demanding tasks. (TARGET DATE: 03/18/2017)    Baseline normal: 30.2 sec. and cognitive: 31.2 sec.   Time 8   Period Weeks   Status New     PT LONG TERM GOAL #4   Title Patient will score >/= 40/56 on the Berg Balance Test to indicate a decrease in risk of falling. (TARGET DATE: 03/18/2017)    Time 8   Period Weeks   Status New     PT LONG TERM GOAL #5   Title Patient will improve her ABC (Activities of Balance Confidence) FOTO score by >/=10% to indicate improvement in self-perceived functional mobility. (TARGET DATE: 03/18/2017)    Time 8   Period Weeks   Status New     PT LONG TERM GOAL #6   Title Patient will demonstrate  ability to ambulte on stairs (1 rail), ramp, curb with supervision and LRAD to indicate a decrease in risk of falling when returning to full community ambulation. (TARGET DATE: 03/18/2017)      PT LONG TERM GOAL #7   Title Patient will be able to ambulate 515ft including outdoor surfaces with LRAD and supervision to indicate a decrease in risk of falling when ambulating in the yard and community. (TARGET DATE: 03/18/2017)    Time 8   Period Weeks   Status New     PT LONG TERM GOAL #8   Title Patient will demonstrate ability to lift/carry objects (simulating lawn care materials) on outdoor (grass/gravel) surfaces for 47ft with supervision to indicate a decrease in risk of falling when performing lawn care work. (TARGET DATE: 03/18/2017)    Time 8   Period Weeks   Status New     PT LONG TERM GOAL  #9   TITLE PT will perform 6 minute walk test, and patient will improve by >/=138ft from baseline measurement with stable vital signs to indicate improvment in endurance. (TARGET DATE: 03/18/2017)    Baseline 579'   Time 8   Period Weeks   Status New               Plan - 02/11/17 1544    Clinical Impression Statement PT performed vestibular asssessment today, as pt reported dizziness has been getting progressively worse. Pt had a difficult time following commands and performing testing 2/2  dizziness and fear of onset of dizziness. Positional testing negative for sx's and nystagmus and negative for orthostatic hypotension. Based on exam findings, pt's dizziness is likely mulit-factorial as she has limited cervical ROM and vestibular hypofunction. Pt would benefit from skilled PT to improve safety during functional mobility.    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 initiate HEP (gaze and vestibular activities) and LE strengthening and balance    Consulted and Agree with Plan of Care Family member/caregiver;Patient   Family Member Consulted caregiver - Kim       Patient will benefit from skilled therapeutic intervention in order to improve the following deficits and impairments:  Abnormal gait, Decreased activity tolerance, Decreased balance, Decreased coordination, Decreased safety awareness, Decreased range of motion, Decreased mobility, Decreased knowledge of use of DME, Decreased endurance, Decreased strength, Difficulty walking, Dizziness, Cardiopulmonary status limiting activity  Visit Diagnosis: Dizziness and giddiness  Other abnormalities of gait and mobility     Problem List Patient Active Problem List   Diagnosis Date Noted  . Fall 11/18/2015  . Multiple fractures of ribs of right side 11/18/2015  . Traumatic pneumothorax 11/18/2015  . Muscle spasms of neck 03/23/2015  . Acute encephalopathy 03/22/2015  . UTI (lower urinary tract infection) 03/22/2015  . Anxiety 03/22/2015  . Hyponatremia 03/22/2015  . Gait abnormality 05/11/2012  . Weakness 05/11/2012    Shamell Suarez L 02/11/2017, 3:48 PM  [facility] [address] [place], [place], [address] Phone: [phone]   Fax:  [phone]  Name: KAMORA VOSSLER MRN: [id number] Date of Birth: 10-29-1940  Geoffry Paradise, PT,DPT 02/11/17 3:49 PM Phone: [phone] Fax: [phone]

## 2017-02-14 ENCOUNTER — Ambulatory Visit: Payer: Medicare Other | Admitting: Physical Therapy

## 2017-02-14 ENCOUNTER — Encounter: Payer: Self-pay | Admitting: Physical Therapy

## 2017-02-14 DIAGNOSIS — R2689 Other abnormalities of gait and mobility: Secondary | ICD-10-CM

## 2017-02-14 DIAGNOSIS — R296 Repeated falls: Secondary | ICD-10-CM | POA: Diagnosis not present

## 2017-02-14 DIAGNOSIS — M6281 Muscle weakness (generalized): Secondary | ICD-10-CM | POA: Diagnosis not present

## 2017-02-14 DIAGNOSIS — R2681 Unsteadiness on feet: Secondary | ICD-10-CM

## 2017-02-14 DIAGNOSIS — R42 Dizziness and giddiness: Secondary | ICD-10-CM | POA: Diagnosis not present

## 2017-02-14 NOTE — Patient Instructions (Addendum)
Do exercises in a corner with walker in front of you.   Feet Together, Head Motion - Eyes Open    With eyes open, feet together, move head slowly: right / left, up / down, up-right/down-left and up-left/ down-right. Repeat __10__ times per head movement. Do __1__ sessions per day.  Copyright  VHI. All rights reserved.  Feet Apart, Head Motion - Eyes Closed    With eyes closed and feet shoulder width apart, move head slowly, right / left, up / down, up-right/down-left and up-left/ down-right. Repeat __10__ times per head movement. Do __1__ sessions per day.  Copyright  VHI. All rights reserved.  Feet Apart (Compliant Surface) Head Motion - Eyes Open    With eyes open, standing on compliant surface: ___pillow or foam cushion_____, feet shoulder width apart, move head slowly: right / left, up / down, up-right/down-left and up-left/ down-right. Repeat __10__ times per head movement. Do __1__ sessions per day. Copyright  VHI. All rights reserved.

## 2017-02-15 DIAGNOSIS — Z961 Presence of intraocular lens: Secondary | ICD-10-CM | POA: Diagnosis not present

## 2017-02-15 DIAGNOSIS — H43813 Vitreous degeneration, bilateral: Secondary | ICD-10-CM | POA: Diagnosis not present

## 2017-02-15 DIAGNOSIS — H1789 Other corneal scars and opacities: Secondary | ICD-10-CM | POA: Diagnosis not present

## 2017-02-15 DIAGNOSIS — H353132 Nonexudative age-related macular degeneration, bilateral, intermediate dry stage: Secondary | ICD-10-CM | POA: Diagnosis not present

## 2017-02-15 NOTE — Therapy (Signed)
[facility] [address] [place], [place], [address] Phone: [phone]   Fax:  [phone]  Physical Therapy Treatment  Patient Details  Name: Mackenzie Blanchard MRN: [id number] Date of Birth: March 30, 1941 Referring Provider: Lajean Manes MD   Encounter Date: 02/14/2017      PT End of Session - 02/14/17 1646    Visit Number 4   Number of Visits 17   Date for PT Re-Evaluation 03/20/17   Authorization Type Medicare and G-codes every 10th visit    PT Start Time 1325   PT Stop Time 1403   PT Time Calculation (min) 38 min   Activity Tolerance Patient tolerated treatment well   Behavior During Therapy [facility] for tasks assessed/performed      Past Medical History:  Diagnosis Date  . Anxiety   . Arthritis    "neck and upper back" (11/18/2015)  . Chronic right shoulder pain    w/limited ROM (11/18/2015)  . Colon cancer ([place])   . Depression   . Edema    idiopathic  . Fall at home 11/18/2015   right rib fractures with pneumothorax  . Hashimoto's disease   . Hyperlipidemia   . Hypothyroidism   . Osteopenia   . Sinusitis, chronic     Past Surgical History:  Procedure Laterality Date  . CATARACT EXTRACTION W/ INTRAOCULAR LENS  IMPLANT, BILATERAL Bilateral    bilaterally  . COLECTOMY  05/2013  . COLONOSCOPY WITH PROPOFOL N/A 11/16/2012   Procedure: COLONOSCOPY WITH PROPOFOL;  Surgeon: Garlan Fair, MD;  Location: [facility];  Service: Endoscopy;  Laterality: N/A;  h&p in file-[facility]  . TONSILLECTOMY    . TUBAL LIGATION      There were no vitals filed for this visit.      Subjective Assessment - 02/14/17 1327    Subjective No falls since last PT session. She has stopped watering yard at night as PT advised.    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 Yes   Pain Score 1    Pain Location Back   Pain Orientation Lower;Right   Pain Descriptors / Indicators Aching   Pain Type Chronic pain   Pain Onset More than a month ago   Pain Frequency Constant   Aggravating Factors  walking   Pain Relieving Factors sitting.                          [facility] PT Treatment/Exercise - 02/14/17 1325      Ambulation/Gait   Ambulation/Gait Yes   Ambulation/Gait Assistance 4: Min guard   Ambulation/Gait Assistance Details PT added visual cue of theraband to anterior walker to facilitate longer steps & decrease shuffling. Manual cues for RW movement   Ambulation Distance (Feet) 150 Feet   Assistive device Rolling walker   Ambulation Surface Indoor;Level   Stairs Yes   Stairs Assistance 5: Supervision;4: Min guard   Stairs Assistance Details (indicate cue type and reason) verbal & tactile cues on reciprocal pattern   Stair Management Technique Two rails;Alternating pattern;Forwards   Number of Stairs 4  3 reps   Ramp 4: Min assist  Min gaurd with RW  Ramp Details (indicate cue type and reason) verbal & tactile cues on technique including wt shift, posture & safety   Curb 4: Min assist  Min guard with RW   Curb Details (indicate cue type and reason) verbal & tactile cues on RW control, sequence & safety                PT Education - 02/14/17 1315    Education provided Yes   Education Details HEP for balance / vestibular in corner   Person(s) Educated Patient;Caregiver(s)   Methods Handout;Explanation;Demonstration;Verbal cues   Comprehension Verbalized understanding;Verbal cues required;Need further instruction          PT Short Term Goals - 02/14/17 1647      PT SHORT TERM GOAL #1   Title Patient will verbalize understanding and return demonstration  for initial HEP to increase LE strength and balance to reduce risk of falling. (TARGET DATE: 02/18/2017)    Baseline Partially MET 02/14/2017 Pt verbalizes & demo HEP instructed but limited due to not scheduling PT for over 2 weeks after evaluation.    Time 4   Period Weeks   Status Partially Met     PT SHORT TERM GOAL #2   Title Patient's gait speed will be >/= 1.75f/s to indicate a decresae in risk of falling. (TARGET DATE: 02/18/2017)    Time 4   Period Weeks   Status On-going     PT SHORT TERM GOAL #3   Title Patient's cognitive TUG score will be </=20% greater than standard TUG to indicate a decrease in risk of falling while completing attention demanding tasks. (TARGET DATE: 02/18/2017)    Baseline normal: 30.2 sec.  and cognitive: 31.2 sec.   Time 4   Period Weeks   Status On-going     PT SHORT TERM GOAL #4   Title PT will perform 6 minute walk test, and patient will score >/= 787fmore than her baseline measurement to indicate improvement in her endurance. (TARGET DATE: 02/18/2017)    Baseline 579'   Time 4   Period Weeks   Status On-going     PT SHORT TERM GOAL #5   Title Patient will demonstrate ability to ambulate on stairs (2 rails), ramp/curb with min guard to indicate a decrease in risk of falling. (TARGET DATE: 02/18/2017)    Baseline MET 02/14/2017   Time 4   Period Weeks   Status Achieved     PT SHORT TERM GOAL #6   Title Patient will demonstrate ability to ambulate 25021fith RW with supervision to indicate a decrease in risk of falling. (TARGET DATE: 02/18/2017)    Time 4   Period Weeks   Status On-going           PT Long Term Goals - 02/08/17 1317      PT LONG TERM GOAL #1   Title Patient will verbalize understanding and return demonstration for initial HEP to increase LE strength and balance to decrease risk of falling. (TARGET DATE: 03/18/2017)    Time 8   Period Weeks   Status New     PT LONG TERM GOAL #2   Title Patient's gait speed will be >/=  1.8ft75fwith RW to indicate a decresae in her risk of repeated falls. (TARGET DATE: 03/18/2017)    Time 8   Period Weeks   Status New     PT LONG TERM GOAL #3   Title Patient's cognitive TUG score will be </= 10% greater than  standard TUG score to indicate a decrease in risk of falling when completing attention demanding tasks. (TARGET DATE: 03/18/2017)    Baseline normal: 30.2 sec. and cognitive: 31.2 sec.   Time 8   Period Weeks   Status New     PT LONG TERM GOAL #4   Title Patient will score >/= 40/56 on the Berg Balance Test to indicate a decrease in risk of falling. (TARGET DATE: 03/18/2017)    Time 8   Period Weeks   Status New     PT LONG TERM GOAL #5   Title Patient will improve her ABC (Activities of Balance Confidence) FOTO score by >/=10% to indicate improvement in self-perceived functional mobility. (TARGET DATE: 03/18/2017)    Time 8   Period Weeks   Status New     PT LONG TERM GOAL #6   Title Patient will demonstrate ability to ambulte on stairs (1 rail), ramp, curb with supervision and LRAD to indicate a decrease in risk of falling when returning to full community ambulation. (TARGET DATE: 03/18/2017)      PT LONG TERM GOAL #7   Title Patient will be able to ambulate 533f including outdoor surfaces with LRAD and supervision to indicate a decrease in risk of falling when ambulating in the yard and community. (TARGET DATE: 03/18/2017)    Time 8   Period Weeks   Status New     PT LONG TERM GOAL #8   Title Patient will demonstrate ability to lift/carry objects (simulating lawn care materials) on outdoor (grass/gravel) surfaces for 273fwith supervision to indicate a decrease in risk of falling when performing lawn care work. (TARGET DATE: 03/18/2017)    Time 8   Period Weeks   Status New     PT LONG TERM GOAL  #9   TITLE PT will perform 6 minute walk test, and patient will improve by >/=15053from baseline measurement with stable vital signs to indicate improvment in  endurance. (TARGET DATE: 03/18/2017)    Baseline 579'   Time 8  LeeperStatus New               Plan - 02/14/17 1653    Clinical Impression Statement Patient will probably not meet all STGs due to patient not scheduling appointments for first 2 weeks after the evaluation. Use of visual cue of theraband on anterior walker appeared to improve gait pattern. Pt & caregiver seem to understand HEP in corner.    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 review HEP for vestibular activities and add gaze stabilization & LE strengthening and balance    Consulted and Agree with Plan of Care Family member/caregiver;Patient   Family Member Consulted caregiver - Kim       Patient will benefit from skilled therapeutic intervention in order to improve the following deficits and impairments:  Abnormal gait, Decreased activity tolerance, Decreased balance, Decreased coordination, Decreased safety awareness, Decreased range of motion, Decreased mobility, Decreased knowledge of use of DME, Decreased endurance, Decreased strength, Difficulty walking, Dizziness, Cardiopulmonary  status limiting activity  Visit Diagnosis: Dizziness and giddiness  Other abnormalities of gait and mobility  Unsteadiness on feet  Muscle weakness (generalized)     Problem List Patient Active Problem List   Diagnosis Date Noted  . Fall  11/18/2015  . Multiple fractures of ribs of right side 11/18/2015  . Traumatic pneumothorax 11/18/2015  . Muscle spasms of neck 03/23/2015  . Acute encephalopathy 03/22/2015  . UTI (lower urinary tract infection) 03/22/2015  . Anxiety 03/22/2015  . Hyponatremia 03/22/2015  . Gait abnormality 05/11/2012  . Weakness 05/11/2012    Xitlalic Maslin PT, DPT 02/15/2017, 5:57 AM  [facility] [address] [place], [place], [address] Phone: [phone]   Fax:  [phone]  Name: Mackenzie Blanchard MRN: [id number] Date of Birth: [birth year]

## 2017-02-16 ENCOUNTER — Ambulatory Visit: Payer: Medicare Other

## 2017-02-16 DIAGNOSIS — R2681 Unsteadiness on feet: Secondary | ICD-10-CM

## 2017-02-16 DIAGNOSIS — R2689 Other abnormalities of gait and mobility: Secondary | ICD-10-CM

## 2017-02-16 DIAGNOSIS — R42 Dizziness and giddiness: Secondary | ICD-10-CM

## 2017-02-16 DIAGNOSIS — R296 Repeated falls: Secondary | ICD-10-CM | POA: Diagnosis not present

## 2017-02-16 DIAGNOSIS — M6281 Muscle weakness (generalized): Secondary | ICD-10-CM | POA: Diagnosis not present

## 2017-02-16 NOTE — Therapy (Signed)
[facility] [address] [place], [place], [address] Phone: [phone]   Fax:  [phone]  Physical Therapy Treatment  Patient Details  Name: Mackenzie Blanchard MRN: [id number] Date of Birth: Jun 11, 1941 Referring Provider: Lajean Manes MD   Encounter Date: 02/16/2017      PT End of Session - 02/16/17 1314    Visit Number 5   Number of Visits 17   Authorization Type Medicare and G-codes every 10th visit    PT Start Time 1231   PT Stop Time 1312   PT Time Calculation (min) 41 min   Equipment Utilized During Treatment --  min guard to S prn   Activity Tolerance Patient limited by fatigue   Behavior During Therapy [facility] for tasks assessed/performed      Past Medical History:  Diagnosis Date  . Anxiety   . Arthritis    "neck and upper back" (11/18/2015)  . Chronic right shoulder pain    w/limited ROM (11/18/2015)  . Colon cancer ([place])   . Depression   . Edema    idiopathic  . Fall at home 11/18/2015   right rib fractures with pneumothorax  . Hashimoto's disease   . Hyperlipidemia   . Hypothyroidism   . Osteopenia   . Sinusitis, chronic     Past Surgical History:  Procedure Laterality Date  . CATARACT EXTRACTION W/ INTRAOCULAR LENS  IMPLANT, BILATERAL Bilateral    bilaterally  . COLECTOMY  05/2013  . COLONOSCOPY WITH PROPOFOL N/A 11/16/2012   Procedure: COLONOSCOPY WITH PROPOFOL;  Surgeon: Garlan Fair, MD;  Location: [facility];  Service: Endoscopy;  Laterality: N/A;  h&p in file-[facility]  . TONSILLECTOMY    . TUBAL LIGATION      There were no vitals filed for this visit.      Subjective Assessment - 02/16/17 1235    Subjective Pt denied falls or changes since last visit.    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 No/denies  no pain at rest            [facility] PT Assessment - 02/16/17 1237      6 Minute Walk- Baseline   6 Minute Walk- Baseline yes   BP (mmHg) 111/72   HR (bpm) 79   02 Sat (%RA) 99 %   Modified Borg Scale for Dyspnea 0- Nothing at all   Perceived Rate of Exertion (Borg) 11- Fairly light  walking back to the gym and getting dressed     6 Minute walk- Post Test   6 Minute Walk Post Test yes   BP (mmHg) 119/61   HR (bpm) 79   02 Sat (%RA) 99 %   Modified Borg Scale for Dyspnea 0- Nothing at all   Perceived Rate of Exertion (Borg) 13- Somewhat hard     6 minute walk test results    Aerobic Endurance Distance Walked 517   Endurance additional comments With min guard S to ensure safety and cues to navigate corners. Pt required rest break after amb. Back to gym and prior to starting test. Pt required seated rest break after test and amb.  [facility] Adult PT Treatment/Exercise - 02/16/17 1255      Ambulation/Gait   Ambulation/Gait Yes   Ambulation/Gait Assistance 5: Supervision;4: Min guard   Ambulation/Gait Assistance Details Cues to improve upward gaze, stride length, and to stay within RW. Pt reported 2-3/10 R hip pain after amb.    Ambulation Distance (Feet) 517 Feet  during 6MWT and 150'   Assistive device Rolling walker   Gait Pattern Step-through pattern;Decreased step length - left;Decreased stance time - right;Decreased stride length;Decreased weight shift to right;Trunk flexed   Ambulation Surface Level;Indoor   Gait velocity 1.30f/sec with RW     Timed Up and Go Test   TUG Normal TUG;Cognitive TUG   Normal TUG (seconds) 33.1  with RW   Cognitive TUG (seconds) 46.4  with RW counting backwards by 3's.                 PT Education  - 02/16/17 1313    Education provided Yes   Education Details PT educated pt on goal progress and the importance of using RW at all times for safety. PT clarified head turn/balance HEP, with cues and demo.   Person(s) Educated Patient;Caregiver(s)   Methods Explanation   Comprehension Verbalized understanding          PT Short Term Goals - 02/16/17 1316      PT SHORT TERM GOAL #1   Title Patient will verbalize understanding and return demonstration for initial HEP to increase LE strength and balance to reduce risk of falling. (TARGET DATE: 02/18/2017)    Baseline Partially MET 02/14/2017 Pt verbalizes & demo HEP instructed but limited due to not scheduling PT for over 2 weeks after evaluation.    Time 4   Period Weeks   Status Partially Met     PT SHORT TERM GOAL #2   Title Patient's gait speed will be >/= 1.630fs to indicate a decresae in risk of falling. (TARGET DATE: 02/18/2017)    Time 4   Period Weeks   Status Achieved     PT SHORT TERM GOAL #3   Title Patient's cognitive TUG score will be </=20% greater than standard TUG to indicate a decrease in risk of falling while completing attention demanding tasks. (TARGET DATE: 02/18/2017)    Baseline normal: 30.2 sec.  and cognitive: 31.2 sec.   Time 4   Period Weeks   Status Not Met     PT SHORT TERM GOAL #4   Title PT will perform 6 minute walk test, and patient will score >/= 7539fore than her baseline measurement to indicate improvement in her endurance. (TARGET DATE: 02/18/2017)    Baseline 579'   Time 4   Period Weeks   Status Not Met     PT SHORT TERM GOAL #5   Title Patient will demonstrate ability to ambulate on stairs (2 rails), ramp/curb with min guard to indicate a decrease in risk of falling. (TARGET DATE: 02/18/2017)    Baseline MET 02/14/2017   Time 4   Period Weeks   Status Achieved     PT SHORT TERM GOAL #6   Title Patient will demonstrate ability to ambulate 250f82fth RW with supervision to indicate a  decrease in risk of falling. (TARGET DATE: 02/18/2017)    Time 4   Period Weeks   Status Partially Met           PT Long Term Goals - 02/08/17 1317      PT LONG TERM GOAL #1  Title Patient will verbalize understanding and return demonstration for initial HEP to increase LE strength and balance to decrease risk of falling. (TARGET DATE: 03/18/2017)    Time 8   Period Weeks   Status New     PT LONG TERM GOAL #2   Title Patient's gait speed will be >/= 1.47f/s with RW to indicate a decresae in her risk of repeated falls. (TARGET DATE: 03/18/2017)    Time 8   Period Weeks   Status New     PT LONG TERM GOAL #3   Title Patient's cognitive TUG score will be </= 10% greater than standard TUG score to indicate a decrease in risk of falling when completing attention demanding tasks. (TARGET DATE: 03/18/2017)    Baseline normal: 30.2 sec. and cognitive: 31.2 sec.   Time 8   Period Weeks   Status New     PT LONG TERM GOAL #4   Title Patient will score >/= 40/56 on the Berg Balance Test to indicate a decrease in risk of falling. (TARGET DATE: 03/18/2017)    Time 8   Period Weeks   Status New     PT LONG TERM GOAL #5   Title Patient will improve her ABC (Activities of Balance Confidence) FOTO score by >/=10% to indicate improvement in self-perceived functional mobility. (TARGET DATE: 03/18/2017)    Time 8   Period Weeks   Status New     PT LONG TERM GOAL #6   Title Patient will demonstrate ability to ambulte on stairs (1 rail), ramp, curb with supervision and LRAD to indicate a decrease in risk of falling when returning to full community ambulation. (TARGET DATE: 03/18/2017)      PT LONG TERM GOAL #7   Title Patient will be able to ambulate 5069fincluding outdoor surfaces with LRAD and supervision to indicate a decrease in risk of falling when ambulating in the yard and community. (TARGET DATE: 03/18/2017)    Time 8   Period Weeks   Status New     PT LONG TERM GOAL #8   Title Patient  will demonstrate ability to lift/carry objects (simulating lawn care materials) on outdoor (grass/gravel) surfaces for 2078fith supervision to indicate a decrease in risk of falling when performing lawn care work. (TARGET DATE: 03/18/2017)    Time 8   Period Weeks   Status New     PT LONG TERM GOAL  #9   TITLE PT will perform 6 minute walk test, and patient will improve by >/=150f38fom baseline measurement with stable vital signs to indicate improvment in endurance. (TARGET DATE: 03/18/2017)    Baseline 579'   Time 8   Period Weeks   Status New               Plan - 02/16/17 1315    Clinical Impression Statement Pt demonstrated progress as she met STG 2, pt partially met STG 6, and did not meet STGs 3 or 4. Pt likely did not meet goals due to not scheduling first 2 weeks of appt's and due to fatigue today, pt also more aware of safety during gait. Pt required rest breaks 2/2 fatigue during session. Pt would continue to benefit from skilled PT to improve safety during functional mobility.   Rehab Potential Good   Clinical Impairments Affecting Rehab Potential anxiety, depression, hypothyroidism, R rib fracture (April 2017) due to fall, pneumothorax (April 2017) due to a fall, history of cecal cancer, venous insufficiency, osteopenia, DJD in cervical  spine     PT Frequency 2x / week   PT Duration 8 weeks   PT Treatment/Interventions ADLs/Self Care Home Management;Neuromuscular re-education;Balance training;Therapeutic exercise;Therapeutic activities;Functional mobility training;Stair training;Gait training;DME Instruction;Patient/family education;Energy conservation   PT Next Visit Plan review HEP for vestibular activities and add gaze stabilization & LE strengthening and balance    Consulted and Agree with Plan of Care Family member/caregiver;Patient   Family Member Consulted caregiver - Kim       Patient will benefit from skilled therapeutic intervention in order to improve the  following deficits and impairments:  Abnormal gait, Decreased activity tolerance, Decreased balance, Decreased coordination, Decreased safety awareness, Decreased range of motion, Decreased mobility, Decreased knowledge of use of DME, Decreased endurance, Decreased strength, Difficulty walking, Dizziness, Cardiopulmonary status limiting activity  Visit Diagnosis: Other abnormalities of gait and mobility  Unsteadiness on feet  Dizziness and giddiness     Problem List Patient Active Problem List   Diagnosis Date Noted  . Fall 11/18/2015  . Multiple fractures of ribs of right side 11/18/2015  . Traumatic pneumothorax 11/18/2015  . Muscle spasms of neck 03/23/2015  . Acute encephalopathy 03/22/2015  . UTI (lower urinary tract infection) 03/22/2015  . Anxiety 03/22/2015  . Hyponatremia 03/22/2015  . Gait abnormality 05/11/2012  . Weakness 05/11/2012    Miller,Jennifer L 02/16/2017, 1:17 PM  [facility] [address] [place], [place], [address] Phone: [phone]   Fax:  [phone]  Name: ABYGAYLE DELTORO MRN: [id number] Date of Birth: 1941-03-05  Geoffry Paradise, PT,DPT 02/16/17 1:18 PM Phone: [phone] Fax: [phone]

## 2017-02-17 DIAGNOSIS — M1711 Unilateral primary osteoarthritis, right knee: Secondary | ICD-10-CM | POA: Diagnosis not present

## 2017-02-17 DIAGNOSIS — M7121 Synovial cyst of popliteal space [Baker], right knee: Secondary | ICD-10-CM | POA: Diagnosis not present

## 2017-02-23 ENCOUNTER — Ambulatory Visit: Payer: Medicare Other

## 2017-02-23 DIAGNOSIS — M6281 Muscle weakness (generalized): Secondary | ICD-10-CM

## 2017-02-23 DIAGNOSIS — R2681 Unsteadiness on feet: Secondary | ICD-10-CM

## 2017-02-23 DIAGNOSIS — R2689 Other abnormalities of gait and mobility: Secondary | ICD-10-CM

## 2017-02-23 DIAGNOSIS — R42 Dizziness and giddiness: Secondary | ICD-10-CM

## 2017-02-23 NOTE — Patient Instructions (Addendum)
Do exercises in a corner with walker in front of you.   Feet Together, Head Motion - Eyes Open    With eyes open, feet together, move head slowly: right to left. Repeat __10__ times per head movement. Do __1__ sessions per day.  Copyright  VHI. All rights reserved.    Feet Together, Head Motion - Eyes Closed    With eyes closed and feet together, move head slowly, right to left. Repeat __10__ times per session. Do __1__ sessions per day.  Copyright  VHI. All rights reserved.   Gaze Stabilization: Tip Card  1.Target must remain in focus, not blurry, and appear stationary while head is in motion. 2.Perform exercises with small head movements (45 to either side of midline). 3.Increase speed of head motion so long as target is in focus. 4.If you wear eyeglasses, be sure you can see target through lens (therapist will give specific instructions for bifocal / progressive lenses). 5.These exercises may provoke dizziness or nausea. Work through these symptoms. If too dizzy, slow head movement slightly. Rest between each exercise. 6.Exercises demand concentration; avoid distractions.  Copyright  VHI. All rights reserved.    Gaze Stabilization: Sitting    Keeping eyes on target on wall 3-5 feet away, tilt head down 15-30 and move head side to side for __20-30__ seconds.  Do __2__ sessions per day.  Copyright  VHI. All rights reserved.

## 2017-02-23 NOTE — Therapy (Addendum)
[facility] [address] [place], [place], [address] Phone: [phone]   Fax:  [phone]  Physical Therapy Treatment  Patient Details  Name: Mackenzie Blanchard MRN: [id number] Date of Birth: Dec 17, 1940 Referring Provider: Lajean Manes MD   Encounter Date: 02/23/2017      PT End of Session - 02/23/17 1401    Visit Number 6   Number of Visits 17   Date for PT Re-Evaluation 03/20/17   Authorization Type Medicare and G-codes every 10th visit    PT Start Time 1319   PT Stop Time 1358   PT Time Calculation (min) 39 min   Equipment Utilized During Treatment --  min guard to S prn   Activity Tolerance Patient limited by fatigue   Behavior During Therapy [facility] for tasks assessed/performed      Past Medical History:  Diagnosis Date  . Anxiety   . Arthritis    "neck and upper back" (11/18/2015)  . Chronic right shoulder pain    w/limited ROM (11/18/2015)  . Colon cancer ([place])   . Depression   . Edema    idiopathic  . Fall at home 11/18/2015   right rib fractures with pneumothorax  . Hashimoto's disease   . Hyperlipidemia   . Hypothyroidism   . Osteopenia   . Sinusitis, chronic     Past Surgical History:  Procedure Laterality Date  . CATARACT EXTRACTION W/ INTRAOCULAR LENS  IMPLANT, BILATERAL Bilateral    bilaterally  . COLECTOMY  05/2013  . COLONOSCOPY WITH PROPOFOL N/A 11/16/2012   Procedure: COLONOSCOPY WITH PROPOFOL;  Surgeon: Garlan Fair, MD;  Location: [facility];  Service: Endoscopy;  Laterality: N/A;  h&p in file-[facility]  . TONSILLECTOMY    . TUBAL LIGATION      There were no vitals filed for this visit.      Subjective Assessment - 02/23/17 1323    Subjective Pt denied falls or changes since last visit.    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 No/denies  no pain in R hip at rest but pain occurs while moving around                         [facility] Adult PT Treatment/Exercise - 02/23/17 1359      Transfers   Transfers Sit to Stand;Stand to Sit   Sit to Stand 5: Supervision;With upper extremity assist;With armrests;From chair/3-in-1   Sit to Stand Details Tactile cues for sequencing;Tactile cues for weight shifting;Verbal cues for sequencing;Verbal cues for technique;Verbal cues for precautions/safety   Sit to Stand Details (indicate cue type and reason) Cues to improve ant. weight shifting and decr. use of UE support during STS exercise only.   Stand to Sit 5: Supervision;With upper extremity assist;With armrests;To chair/3-in-1;Uncontrolled descent   Stand to Sit Details (indicate cue type and reason) Tactile cues for initiation;Tactile cues for sequencing;Verbal cues for sequencing;Verbal cues for technique   Stand to Sit Details Cues to improve eccentric control.   Number of Reps Other reps (comment)  11 reps         Vestibular Treatment/Exercise - 02/23/17 1404  Vestibular Treatment/Exercise   Vestibular Treatment Provided Gaze   Gaze Exercises X1 Viewing Horizontal     X1 Viewing Horizontal   Foot Position seated   Time --  20-30 sec.   Reps 5   Comments Extensive cues for technique and demo. Please see pt instructions for details. Horizontal head turns only. Pt reported slight dizziness but unable to rate, only able to state it is less than 6/10.            Balance Exercises - 02/23/17 1347      Balance Exercises: Standing   Standing Eyes Opened Narrow base of support (BOS);Wide (BOA);Head turns;3 reps;Solid surface  no UE support   Standing Eyes Closed Narrow base of support (BOS);Wide (BOA);Head turns;Solid surface;3 reps;30  secs   Tandem Stance 15 secs   Other Standing Exercises Performed in corner with RW and S for safety. Cues and demo for technique. Please see pt instructions for HEP details.            PT Education - 02/23/17 1400    Education provided Yes   Education Details PT added gaze stabilization to HEP, with extensive cues. PT modified all standing balance activities to include side to side head motion only, as pt has difficulty performing side to side vs. up and down, and confuses easily.    Person(s) Educated Patient;Caregiver(s)   Methods Explanation;Demonstration;Tactile cues;Verbal cues;Handout   Comprehension Returned demonstration;Verbalized understanding;Need further instruction          PT Short Term Goals - 02/16/17 1316      PT SHORT TERM GOAL #1   Title Patient will verbalize understanding and return demonstration for initial HEP to increase LE strength and balance to reduce risk of falling. (TARGET DATE: 02/18/2017)    Baseline Partially MET 02/14/2017 Pt verbalizes & demo HEP instructed but limited due to not scheduling PT for over 2 weeks after evaluation.    Time 4   Period Weeks   Status Partially Met     PT SHORT TERM GOAL #2   Title Patient's gait speed will be >/= 1.8f/s to indicate a decresae in risk of falling. (TARGET DATE: 02/18/2017)    Time 4   Period Weeks   Status Achieved     PT SHORT TERM GOAL #3   Title Patient's cognitive TUG score will be </=20% greater than standard TUG to indicate a decrease in risk of falling while completing attention demanding tasks. (TARGET DATE: 02/18/2017)    Baseline normal: 30.2 sec.  and cognitive: 31.2 sec.   Time 4   Period Weeks   Status Not Met     PT SHORT TERM GOAL #4   Title PT will perform 6 minute walk test, and patient will score >/= 741fmore than her baseline measurement to indicate improvement in her endurance. (TARGET DATE: 02/18/2017)    Baseline 579'   Time 4   Period Weeks   Status Not Met     PT SHORT  TERM GOAL #5   Title Patient will demonstrate ability to ambulate on stairs (2 rails), ramp/curb with min guard to indicate a decrease in risk of falling. (TARGET DATE: 02/18/2017)    Baseline MET 02/14/2017   Time 4   Period Weeks   Status Achieved     PT SHORT TERM GOAL #6   Title Patient will demonstrate ability to ambulate 25075fith RW with supervision to indicate a decrease in risk of falling. (TARGET DATE: 02/18/2017)    Time 4  Period Weeks   Status Partially Met           PT Long Term Goals - 02/08/17 1317      PT LONG TERM GOAL #1   Title Patient will verbalize understanding and return demonstration for initial HEP to increase LE strength and balance to decrease risk of falling. (TARGET DATE: 03/18/2017)    Time 8   Period Weeks   Status New     PT LONG TERM GOAL #2   Title Patient's gait speed will be >/= 1.68f/s with RW to indicate a decresae in her risk of repeated falls. (TARGET DATE: 03/18/2017)    Time 8   Period Weeks   Status New     PT LONG TERM GOAL #3   Title Patient's cognitive TUG score will be </= 10% greater than standard TUG score to indicate a decrease in risk of falling when completing attention demanding tasks. (TARGET DATE: 03/18/2017)    Baseline normal: 30.2 sec. and cognitive: 31.2 sec.   Time 8   Period Weeks   Status New     PT LONG TERM GOAL #4   Title Patient will score >/= 40/56 on the Berg Balance Test to indicate a decrease in risk of falling. (TARGET DATE: 03/18/2017)    Time 8   Period Weeks   Status New     PT LONG TERM GOAL #5   Title Patient will improve her ABC (Activities of Balance Confidence) FOTO score by >/=10% to indicate improvement in self-perceived functional mobility. (TARGET DATE: 03/18/2017)    Time 8   Period Weeks   Status New     PT LONG TERM GOAL #6   Title Patient will demonstrate ability to ambulte on stairs (1 rail), ramp, curb with supervision and LRAD to indicate a decrease in risk of falling when  returning to full community ambulation. (TARGET DATE: 03/18/2017)      PT LONG TERM GOAL #7   Title Patient will be able to ambulate 5042fincluding outdoor surfaces with LRAD and supervision to indicate a decrease in risk of falling when ambulating in the yard and community. (TARGET DATE: 03/18/2017)    Time 8   Period Weeks   Status New     PT LONG TERM GOAL #8   Title Patient will demonstrate ability to lift/carry objects (simulating lawn care materials) on outdoor (grass/gravel) surfaces for 202fith supervision to indicate a decrease in risk of falling when performing lawn care work. (TARGET DATE: 03/18/2017)    Time 8   Period Weeks   Status New     PT LONG TERM GOAL  #9   TITLE PT will perform 6 minute walk test, and patient will improve by >/=150f47fom baseline measurement with stable vital signs to indicate improvment in endurance. (TARGET DATE: 03/18/2017)    Baseline 579'   Time 8   Period Weeks   Status New               Plan - 02/23/17 1402    Clinical Impression Statement Today's skilled session focused on vestibular and balance activities. Pt required extensive cues and demo for technique. Therefore, PT modified HEP to include only horizontal head turns and will progress as tolerated. Pt continues to require rest breaks 2/2 fatigue. Continue with POC.    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     PT Frequency 2x / week   PT Duration 8 weeks   PT Treatment/Interventions ADLs/Self Care Home Management;Neuromuscular re-education;Balance training;Therapeutic exercise;Therapeutic activities;Functional mobility training;Stair training;Gait training;DME Instruction;Patient/family education;Energy conservation   PT Next Visit Plan Add LE  strengthening to HEP (STS, hip strengthening, etc)   Consulted and Agree with Plan of Care Family member/caregiver;Patient   Family Member Consulted caregiver - Kim       Patient will benefit from skilled therapeutic intervention in order to improve the following deficits and impairments:  Abnormal gait, Decreased activity tolerance, Decreased balance, Decreased coordination, Decreased safety awareness, Decreased range of motion, Decreased mobility, Decreased knowledge of use of DME, Decreased endurance, Decreased strength, Difficulty walking, Dizziness, Cardiopulmonary status limiting activity  Visit Diagnosis: Dizziness and giddiness  Other abnormalities of gait and mobility  Unsteadiness on feet  Muscle weakness (generalized)     Problem List Patient Active Problem List   Diagnosis Date Noted  . Fall 11/18/2015  . Multiple fractures of ribs of right side 11/18/2015  . Traumatic pneumothorax 11/18/2015  . Muscle spasms of neck 03/23/2015  . Acute encephalopathy 03/22/2015  . UTI (lower urinary tract infection) 03/22/2015  . Anxiety 03/22/2015  . Hyponatremia 03/22/2015  . Gait abnormality 05/11/2012  . Weakness 05/11/2012    Laycee Fitzsimmons L 02/23/2017, 2:04 PM  [facility] [address] [place], [place], [address] Phone: [phone]   Fax:  [phone]  Name: BIONCA MCKEY MRN: [id number] Date of Birth: 09-25-1940  Geoffry Paradise, PT,DPT 02/23/17 2:05 PM Phone: [phone] Fax: [phone]

## 2017-02-25 ENCOUNTER — Ambulatory Visit: Payer: Medicare Other

## 2017-02-25 DIAGNOSIS — M6281 Muscle weakness (generalized): Secondary | ICD-10-CM

## 2017-02-25 DIAGNOSIS — R2681 Unsteadiness on feet: Secondary | ICD-10-CM

## 2017-02-25 DIAGNOSIS — R2689 Other abnormalities of gait and mobility: Secondary | ICD-10-CM

## 2017-02-25 NOTE — Patient Instructions (Addendum)
Gaze Stabilization: Sitting    Keeping eyes on target on wall 3-5 feet away, tilt head down 15-30 and move head side to side for __20-30__ seconds.  Do __2__ sessions per day.  Copyright  VHI. All rights reserved.   Functional Quadriceps: Sit to Stand    Sit on edge of chair, feet flat on floor. Stand upright, extending knees fully. Squeeze butt muscles to stand up tall.  Repeat __10__ times per set. Do __1__ sets per session. Do __2__ sessions per day.  http://orth.exer.us/735   Leg Raise: Straight - Hook-Lying (Single Leg)    Lying down, one leg straight, and one knee bent. Lift straight leg about 6-12 inches from mat. Repeat _10_ times per set. Repeat with other leg. Do _2_ sets per session per leg. Do _3_ sessions per week.  http://tub.exer.us/188   Copyright  VHI. All rights reserved.    Copyright  VHI. All rights reserved.  Hip Abduction / Adduction: with Knee Flexion (Supine)    With both knees bent, gently lower knees to side and return, tie red band around knees. Repeat ___10_ times per set. Do __3__ sets per session. Do __3__ sessions per week.  http://orth.exer.us/683   Copyright  VHI. All rights reserved.    Backward    Walk backwards, while holding counter, with eyes open. Take 10 even steps, making sure each foot lifts off floor. Repeat for __4__ laps per session. Do __1__ sessions per day.  Copyright  VHI. All rights reserved.

## 2017-02-25 NOTE — Therapy (Signed)
[facility] [address] [place], [place], [address] Phone: [phone]   Fax:  [phone]  Physical Therapy Treatment  Patient Details  Name: Mackenzie Blanchard MRN: [id number] Date of Birth: 04-21-1941 Referring Provider: Lajean Manes MD   Encounter Date: 02/25/2017      PT End of Session - 02/25/17 1553    Visit Number 7   Number of Visits 17   Date for PT Re-Evaluation 03/20/17   Authorization Type Medicare and G-codes every 10th visit    PT Start Time 1401   PT Stop Time 1441   PT Time Calculation (min) 40 min   Activity Tolerance Patient tolerated treatment well   Behavior During Therapy [facility] for tasks assessed/performed      Past Medical History:  Diagnosis Date  . Anxiety   . Arthritis    "neck and upper back" (11/18/2015)  . Chronic right shoulder pain    w/limited ROM (11/18/2015)  . Colon cancer ([place])   . Depression   . Edema    idiopathic  . Fall at home 11/18/2015   right rib fractures with pneumothorax  . Hashimoto's disease   . Hyperlipidemia   . Hypothyroidism   . Osteopenia   . Sinusitis, chronic     Past Surgical History:  Procedure Laterality Date  . CATARACT EXTRACTION W/ INTRAOCULAR LENS  IMPLANT, BILATERAL Bilateral    bilaterally  . COLECTOMY  05/2013  . COLONOSCOPY WITH PROPOFOL N/A 11/16/2012   Procedure: COLONOSCOPY WITH PROPOFOL;  Surgeon: Garlan Fair, MD;  Location: [facility];  Service: Endoscopy;  Laterality: N/A;  h&p in file-[facility]  . TONSILLECTOMY    . TUBAL LIGATION      There were no vitals filed for this visit.      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 No/denies        Therex: Pt performed strengthening HEP with cues and demo for technique. Performed with S for safety. Please see pt instructions for details.                          PT Education - 02/25/17 1552    Education provided Yes   Education Details PT provided pt with strengthening HEP. PT also reprinted gaze stabilization HEP per pt request.    Person(s) Educated Patient;Caregiver(s)   Methods Explanation;Demonstration;Tactile cues;Handout;Verbal cues   Comprehension Returned demonstration;Verbalized understanding;Need further instruction          PT Short Term Goals - 02/16/17 1316      PT SHORT TERM GOAL #1   Title Patient will verbalize understanding and return demonstration for initial HEP to increase LE strength and balance to reduce risk of falling. (TARGET DATE: 02/18/2017)    Baseline Partially MET 02/14/2017 Pt verbalizes & demo HEP instructed but limited due to not scheduling PT for over 2 weeks after evaluation.    Time 4   Period Weeks   Status Partially Met     PT SHORT TERM GOAL #2   Title Patient's gait speed will be >/= 1.61f/s to  indicate a decresae in risk of falling. (TARGET DATE: 02/18/2017)    Time 4   Period Weeks   Status Achieved     PT SHORT TERM GOAL #3   Title Patient's cognitive TUG score will be </=20% greater than standard TUG to indicate a decrease in risk of falling while completing attention demanding tasks. (TARGET DATE: 02/18/2017)    Baseline normal: 30.2 sec.  and cognitive: 31.2 sec.   Time 4   Period Weeks   Status Not Met     PT SHORT TERM GOAL #4   Title PT will perform 6 minute walk test, and patient will score >/= 39f more than her baseline measurement to indicate improvement in her endurance. (TARGET DATE: 02/18/2017)    Baseline 579'   Time 4   Period Weeks   Status Not Met     PT SHORT TERM GOAL #5   Title Patient will  demonstrate ability to ambulate on stairs (2 rails), ramp/curb with min guard to indicate a decrease in risk of falling. (TARGET DATE: 02/18/2017)    Baseline MET 02/14/2017   Time 4   Period Weeks   Status Achieved     PT SHORT TERM GOAL #6   Title Patient will demonstrate ability to ambulate 2579fwith RW with supervision to indicate a decrease in risk of falling. (TARGET DATE: 02/18/2017)    Time 4   Period Weeks   Status Partially Met           PT Long Term Goals - 02/08/17 1317      PT LONG TERM GOAL #1   Title Patient will verbalize understanding and return demonstration for initial HEP to increase LE strength and balance to decrease risk of falling. (TARGET DATE: 03/18/2017)    Time 8   Period Weeks   Status New     PT LONG TERM GOAL #2   Title Patient's gait speed will be >/= 1.70f50f with RW to indicate a decresae in her risk of repeated falls. (TARGET DATE: 03/18/2017)    Time 8   Period Weeks   Status New     PT LONG TERM GOAL #3   Title Patient's cognitive TUG score will be </= 10% greater than standard TUG score to indicate a decrease in risk of falling when completing attention demanding tasks. (TARGET DATE: 03/18/2017)    Baseline normal: 30.2 sec. and cognitive: 31.2 sec.   Time 8   Period Weeks   Status New     PT LONG TERM GOAL #4   Title Patient will score >/= 40/56 on the Berg Balance Test to indicate a decrease in risk of falling. (TARGET DATE: 03/18/2017)    Time 8   Period Weeks   Status New     PT LONG TERM GOAL #5   Title Patient will improve her ABC (Activities of Balance Confidence) FOTO score by >/=10% to indicate improvement in self-perceived functional mobility. (TARGET DATE: 03/18/2017)    Time 8   Period Weeks   Status New     PT LONG TERM GOAL #6   Title Patient will demonstrate ability to ambulte on stairs (1 rail), ramp, curb with supervision and LRAD to indicate a decrease in risk of falling when returning to full community ambulation.  (TARGET DATE: 03/18/2017)      PT LONG TERM GOAL #7   Title Patient will be able to ambulate 500f60fcluding outdoor surfaces with LRAD and supervision to indicate a decrease in risk of  falling when ambulating in the yard and community. (TARGET DATE: 03/18/2017)    Time 8   Period Weeks   Status New     PT LONG TERM GOAL #8   Title Patient will demonstrate ability to lift/carry objects (simulating lawn care materials) on outdoor (grass/gravel) surfaces for 11f with supervision to indicate a decrease in risk of falling when performing lawn care work. (TARGET DATE: 03/18/2017)    Time 8   Period Weeks   Status New     PT LONG TERM GOAL  #9   TITLE PT will perform 6 minute walk test, and patient will improve by >/=1529ffrom baseline measurement with stable vital signs to indicate improvment in endurance. (TARGET DATE: 03/18/2017)    Baseline 579'   Time 8   Period Weeks   Status New               Plan - 02/25/17 1553    Clinical Impression Statement PT provided pt with strengthening HEP today, to improve pt's BLE strength. Most exercises performed in supine position 2/2 pt reported incr. R hip pain upon standing. Pt required rest breaks 2/2 fatigue. Pt unable to tolerate bridges 2/2 R hip pain. Continue with POC.    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   Consulted and Agree with Plan of Care  Family member/caregiver;Patient   Family Member Consulted caregiver - Mackenzie Blanchard       Patient will benefit from skilled therapeutic intervention in order to improve the following deficits and impairments:  Abnormal gait, Decreased activity tolerance, Decreased balance, Decreased coordination, Decreased safety awareness, Decreased range of motion, Decreased mobility, Decreased knowledge of use of DME, Decreased endurance, Decreased strength, Difficulty walking, Dizziness, Cardiopulmonary status limiting activity  Visit Diagnosis: Muscle weakness (generalized)  Other abnormalities of gait and mobility  Unsteadiness on feet     Problem List Patient Active Problem List   Diagnosis Date Noted  . Fall 11/18/2015  . Multiple fractures of ribs of right side 11/18/2015  . Traumatic pneumothorax 11/18/2015  . Muscle spasms of neck 03/23/2015  . Acute encephalopathy 03/22/2015  . UTI (lower urinary tract infection) 03/22/2015  . Anxiety 03/22/2015  . Hyponatremia 03/22/2015  . Gait abnormality 05/11/2012  . Weakness 05/11/2012    Mackenzie Blanchard 02/25/2017, 3:58 PM  [place]: [phone] Fax:  [phone]: Mackenzie Blanchard: [id number] of Birth: 11Jan 17, 1942JeGeoffry ParadisePT,DPT 02/25/17 3:59 PM Phone: [phone]: [phone]

## 2017-02-28 ENCOUNTER — Ambulatory Visit: Payer: Medicare Other | Admitting: Physical Therapy

## 2017-02-28 ENCOUNTER — Encounter: Payer: Self-pay | Admitting: Physical Therapy

## 2017-02-28 DIAGNOSIS — R42 Dizziness and giddiness: Secondary | ICD-10-CM

## 2017-02-28 DIAGNOSIS — M6281 Muscle weakness (generalized): Secondary | ICD-10-CM | POA: Diagnosis not present

## 2017-02-28 DIAGNOSIS — R296 Repeated falls: Secondary | ICD-10-CM | POA: Diagnosis not present

## 2017-02-28 DIAGNOSIS — R2681 Unsteadiness on feet: Secondary | ICD-10-CM | POA: Diagnosis not present

## 2017-02-28 DIAGNOSIS — R2689 Other abnormalities of gait and mobility: Secondary | ICD-10-CM

## 2017-02-28 NOTE — Therapy (Signed)
[facility] [address] [place], [place], [address] Phone: [phone]   Fax:  [phone]  Physical Therapy Treatment  Patient Details  Name: Mackenzie Blanchard MRN: [id number] Date of Birth: 1941/05/29 Referring Provider: Lajean Manes MD   Encounter Date: 02/28/2017      PT End of Session - 02/28/17 2147    Visit Number 8   Number of Visits 17   Date for PT Re-Evaluation 03/20/17   Authorization Type Medicare and G-codes every 10th visit    PT Start Time 0786   PT Stop Time 1400   PT Time Calculation (min) 43 min   Equipment Utilized During Treatment Gait belt   Activity Tolerance Patient tolerated treatment well   Behavior During Therapy [facility] for tasks assessed/performed      Past Medical History:  Diagnosis Date  . Anxiety   . Arthritis    "neck and upper back" (11/18/2015)  . Chronic right shoulder pain    w/limited ROM (11/18/2015)  . Colon cancer ([place])   . Depression   . Edema    idiopathic  . Fall at home 11/18/2015   right rib fractures with pneumothorax  . Hashimoto's disease   . Hyperlipidemia   . Hypothyroidism   . Osteopenia   . Sinusitis, chronic     Past Surgical History:  Procedure Laterality Date  . CATARACT EXTRACTION W/ INTRAOCULAR LENS  IMPLANT, BILATERAL Bilateral    bilaterally  . COLECTOMY  05/2013  . COLONOSCOPY WITH PROPOFOL N/A 11/16/2012   Procedure: COLONOSCOPY WITH PROPOFOL;  Surgeon: Garlan Fair, MD;  Location: [facility];  Service: Endoscopy;  Laterality: N/A;  h&p in file-[facility]  . TONSILLECTOMY    . TUBAL LIGATION      There were no vitals filed for this visit.      Subjective Assessment - 02/28/17 1320    Subjective Pt reports no falls. She has been doing her exercises and believes they are going okay.    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 No/denies                         [facility] Adult PT Treatment/Exercise - 02/28/17 1317      Transfers   Transfers Sit to Stand;Stand to Sit   Sit to Stand 5: Supervision;With upper extremity assist;With armrests;From chair/3-in-1  to RW, from chairs with & without armrests   Sit to Stand Details Tactile cues for sequencing;Tactile cues for weight shifting;Verbal cues for sequencing;Verbal cues for technique;Verbal cues for precautions/safety   Sit to Stand Details (indicate cue type and reason) demo, instruction on proper wt shift, progressing to chairs without armrests   Stand to Sit 5: Supervision;With upper extremity assist;With armrests;To chair/3-in-1;Uncontrolled descent  chairs with & without armrests, from [facility] for stabilization   Stand to Sit Details (indicate cue type and reason) Tactile cues for initiation;Tactile cues for sequencing;Verbal cues for sequencing;Verbal cues for technique   Stand to Sit Details PT demo, instructed in technique to initiate by maintaining wt over feet while lowering hands to ht of chair.      Ambulation/Gait   Ambulation/Gait Yes   Ambulation/Gait Assistance 5:  Supervision   Ambulation/Gait Assistance Details visual, verbal & tactile cues on posture, position within RW & looking up    Ambulation Distance (Feet) 600 Feet   Assistive device Rolling walker   Gait Pattern Step-through pattern;Decreased step length - left;Decreased stance time - right;Decreased stride length;Decreased weight shift to right;Trunk flexed   Ambulation Surface Indoor;Level   Stairs Yes   Stairs Assistance 5: Supervision   Stairs Assistance Details (indicate cue type and reason) tactile & verbal cues to progress to alternating pattern   Stair Management  Technique One rail Left;Step to pattern;Two rails;Alternating pattern;Forwards   Number of Stairs 4  3 reps   Ramp 4: Min assist  RW   Ramp Details (indicate cue type and reason) tactile & verbal cues on posture & wt shift   Curb 4: Min assist  RW   Curb Details (indicate cue type and reason) verbal, demo & tactile cues on position prior to moving RW and sequence     Neuro Re-ed    Neuro Re-ed Details  In parallel bars: standing crossways on foam beam; static up to 30 seconds; head turns right /left & up /down with Raul Del /tactile cues for balance reactions; progressed to alternate stepping off beam forward with hand hold assist.                 PT Education - 02/28/17 1317    Education provided Yes   Education Details piriformis stretch sitting & ice massage   Person(s) Educated Patient;Caregiver(s)   Methods Explanation;Demonstration;Verbal cues   Comprehension Verbalized understanding;Verbal cues required;Need further instruction          PT Short Term Goals - 02/16/17 1316      PT SHORT TERM GOAL #1   Title Patient will verbalize understanding and return demonstration for initial HEP to increase LE strength and balance to reduce risk of falling. (TARGET DATE: 02/18/2017)    Baseline Partially MET 02/14/2017 Pt verbalizes & demo HEP instructed but limited due to not scheduling PT for over 2 weeks after evaluation.    Time 4   Period Weeks   Status Partially Met     PT SHORT TERM GOAL #2   Title Patient's gait speed will be >/= 1.59f/s to indicate a decresae in risk of falling. (TARGET DATE: 02/18/2017)    Time 4   Period Weeks   Status Achieved     PT SHORT TERM GOAL #3   Title Patient's cognitive TUG score will be </=20% greater than standard TUG to indicate a decrease in risk of falling while completing attention demanding tasks. (TARGET DATE: 02/18/2017)    Baseline normal: 30.2 sec.  and cognitive: 31.2 sec.   Time 4   Period Weeks   Status Not Met     PT  SHORT TERM GOAL #4   Title PT will perform 6 minute walk test, and patient will score >/= 749fmore than her baseline measurement to indicate improvement in her endurance. (TARGET DATE: 02/18/2017)    Baseline 579'   Time 4   Period Weeks   Status Not Met     PT SHORT TERM GOAL #5   Title Patient will demonstrate ability to ambulate on stairs (2 rails), ramp/curb with min guard to indicate a decrease in risk of falling. (TARGET DATE: 02/18/2017)    Baseline MET 02/14/2017   Time 4   Period Weeks   Status Achieved     PT SHORT TERM GOAL #6   Title Patient will  demonstrate ability to ambulate 276f with RW with supervision to indicate a decrease in risk of falling. (TARGET DATE: 02/18/2017)    Time 4   Period Weeks   Status Partially Met           PT Long Term Goals - 02/28/17 2145      PT LONG TERM GOAL #1   Title Patient will verbalize understanding and return demonstration for initial HEP to increase LE strength and balance to decrease risk of falling. (TARGET DATE: 03/18/2017)    Time 8   Period Weeks   Status On-going     PT LONG TERM GOAL #2   Title Patient's gait speed will be >/= 1.872fs with RW to indicate a decresae in her risk of repeated falls. (TARGET DATE: 03/18/2017)    Time 8   Period Weeks   Status On-going     PT LONG TERM GOAL #3   Title Patient's cognitive TUG score will be </= 10% greater than standard TUG score to indicate a decrease in risk of falling when completing attention demanding tasks. (TARGET DATE: 03/18/2017)    Baseline normal: 30.2 sec. and cognitive: 31.2 sec.   Time 8   Period Weeks   Status On-going     PT LONG TERM GOAL #4   Title Patient will score >/= 40/56 on the Berg Balance Test to indicate a decrease in risk of falling. (TARGET DATE: 03/18/2017)    Time 8   Period Weeks   Status On-going     PT LONG TERM GOAL #5   Title Patient will improve her ABC (Activities of Balance Confidence) FOTO score by >/=10% to indicate improvement in  self-perceived functional mobility. (TARGET DATE: 03/18/2017)    Time 8   Period Weeks   Status On-going     PT LONG TERM GOAL #6   Title Patient will demonstrate ability to ambulte on stairs (1 rail), ramp, curb with supervision and LRAD to indicate a decrease in risk of falling when returning to full community ambulation. (TARGET DATE: 03/18/2017)    Status On-going     PT LONG TERM GOAL #7   Title Patient will be able to ambulate 5003fncluding outdoor surfaces with LRAD and supervision to indicate a decrease in risk of falling when ambulating in the yard and community. (TARGET DATE: 03/18/2017)    Time 8   Period Weeks   Status On-going     PT LONG TERM GOAL #8   Title Patient will demonstrate ability to lift/carry objects (simulating lawn care materials) on outdoor (grass/gravel) surfaces for 32f85fth supervision to indicate a decrease in risk of falling when performing lawn care work. (TARGET DATE: 03/18/2017)    Time 8   Period Weeks   Status On-going     PT LONG TERM GOAL  #9   TITLE PT will perform 6 minute walk test, and patient will improve by >/=150ft81fm baseline measurement with stable vital signs to indicate improvment in endurance. (TARGET DATE: 03/18/2017)    Baseline 579' initil 6-minute walk test   Time 8   Period Weeks   Status On-going               Plan - 02/28/17 2159    Clinical Impression Statement Patient improved gait on ramps, curbs & stairs with skilled instruction in proper technique. She verbalized understanding of piriformis stretch and use of ice massage for buttocks & ITB area.    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   Consulted and Agree with Plan of Care Family member/caregiver;Patient   Family Member Consulted caregiver - Kim       Patient will benefit from skilled therapeutic intervention in order to improve the following deficits and impairments:  Abnormal gait, Decreased activity tolerance, Decreased balance, Decreased coordination, Decreased safety awareness, Decreased range of motion, Decreased mobility, Decreased knowledge of use of DME, Decreased endurance, Decreased strength, Difficulty walking, Dizziness, Cardiopulmonary status limiting activity  Visit Diagnosis: Muscle weakness (generalized)  Other abnormalities of gait and mobility  Unsteadiness on feet  Dizziness and giddiness  Repeated falls     Problem List Patient Active Problem List   Diagnosis Date Noted  . Fall 11/18/2015  . Multiple fractures of ribs of right side 11/18/2015  . Traumatic pneumothorax 11/18/2015  . Muscle spasms of neck 03/23/2015  . Acute encephalopathy 03/22/2015  . UTI (lower urinary tract infection) 03/22/2015  . Anxiety 03/22/2015  . Hyponatremia 03/22/2015  . Gait abnormality 05/11/2012  . Weakness 05/11/2012    Exzavier Ruderman PT, DPT 02/28/2017, 10:02 PM  [facility] [address] [place], [place], [address] Phone: [phone]   Fax:  [phone]  Name: Mackenzie Blanchard MRN: [id number] Date of Birth: Jul 16, 1941

## 2017-03-02 ENCOUNTER — Encounter: Payer: Self-pay | Admitting: Physical Therapy

## 2017-03-02 ENCOUNTER — Ambulatory Visit: Payer: Medicare Other | Attending: Geriatric Medicine | Admitting: Physical Therapy

## 2017-03-02 DIAGNOSIS — R42 Dizziness and giddiness: Secondary | ICD-10-CM | POA: Insufficient documentation

## 2017-03-02 DIAGNOSIS — M6281 Muscle weakness (generalized): Secondary | ICD-10-CM

## 2017-03-02 DIAGNOSIS — R2681 Unsteadiness on feet: Secondary | ICD-10-CM | POA: Insufficient documentation

## 2017-03-02 DIAGNOSIS — R2689 Other abnormalities of gait and mobility: Secondary | ICD-10-CM | POA: Diagnosis not present

## 2017-03-03 DIAGNOSIS — M7121 Synovial cyst of popliteal space [Baker], right knee: Secondary | ICD-10-CM | POA: Diagnosis not present

## 2017-03-03 NOTE — Therapy (Signed)
[facility] [address] [place], [place], [address] Phone: [phone]   Fax:  [phone]  Physical Therapy Treatment  Patient Details  Name: Mackenzie Blanchard MRN: [id number] Date of Birth: [birth year] Referring Provider: Lajean Manes MD   Encounter Date: 03/02/2017      PT End of Session - 03/02/17 1945    Visit Number 9   Number of Visits 17   Date for PT Re-Evaluation 03/20/17   Authorization Type Medicare and G-codes every 10th visit    PT Start Time 1323   PT Stop Time 1401   PT Time Calculation (min) 38 min   Equipment Utilized During Treatment Gait belt   Activity Tolerance Patient tolerated treatment well   Behavior During Therapy [facility] for tasks assessed/performed      Past Medical History:  Diagnosis Date  . Anxiety   . Arthritis    "neck and upper back" (11/18/2015)  . Chronic right shoulder pain    w/limited ROM (11/18/2015)  . Colon cancer ([place])   . Depression   . Edema    idiopathic  . Fall at home 11/18/2015   right rib fractures with pneumothorax  . Hashimoto's disease   . Hyperlipidemia   . Hypothyroidism   . Osteopenia   . Sinusitis, chronic     Past Surgical History:  Procedure Laterality Date  . CATARACT EXTRACTION W/ INTRAOCULAR LENS  IMPLANT, BILATERAL Bilateral    bilaterally  . COLECTOMY  05/2013  . COLONOSCOPY WITH PROPOFOL N/A 11/16/2012   Procedure: COLONOSCOPY WITH PROPOFOL;  Surgeon: Garlan Fair, MD;  Location: [facility];  Service: Endoscopy;  Laterality: N/A;  h&p in file-[facility]  . TONSILLECTOMY    . TUBAL LIGATION      There were no vitals filed for this visit.      Subjective Assessment - 03/02/17 1325    Subjective No falls since session 2 days ago. Her exercises are going well.      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 No/denies                         [facility] Adult PT Treatment/Exercise - 03/02/17 1325      Transfers   Transfers Sit to Stand;Stand to [employer]   Sit to Stand 5: Supervision;With upper extremity assist;From chair/3-in-1;With armrests  to rollator walker   Sit to Stand Details (indicate cue type and reason) demo & verbal cues on safety with rollator   Stand to Sit 5: Supervision;With upper extremity assist;With armrests;To chair/3-in-1  from rollator walker   Stand to Sit Details demo & verbal cues on safety with rollator walker   Stand Pivot Transfers 4: Min guard;5: Supervision;With armrests  180* to sit/stand on rollator walker seat   Stand Pivot Transfer Details (indicate cue type and reason) PT demo, instructed safety with sitting /standing from rollator seat. Pt return demo with walker against wall for stability with supervision / verbal cues & with rollator in open gym with min guard.      Ambulation/Gait   Ambulation/Gait Yes   Ambulation/Gait Assistance 4: Min guard;5: Supervision  min guard initially & on grass.  Ambulation/Gait Assistance Details PT demo, instructed in safety & rollator use including turns. Verbal cues & tactile cues during gait.    Ambulation Distance (Feet) 600 Feet   Assistive device Rollator   Ambulation Surface Indoor;Level;Outdoor;Paved;Gravel;Grass   Ramp 4: Min assist  rollator walker   Ramp Details (indicate cue type and reason) demo, verbal & tactile cues on rollator walker use.    Curb 4: Min Artist Details (indicate cue type and reason) demo, verbal & tactile cues on rollator walker technique                PT Education - 03/02/17 1400    Education provided Yes   Education Details  rollator walker safety, differences to std. RW & design differences.    Person(s) Educated Patient;Caregiver(s)   Methods Explanation;Demonstration;Other (comment)  internet for designs   Comprehension Verbalized understanding;Need further instruction          PT Short Term Goals - 02/16/17 1316      PT SHORT TERM GOAL #1   Title Patient will verbalize understanding and return demonstration for initial HEP to increase LE strength and balance to reduce risk of falling. (TARGET DATE: 02/18/2017)    Baseline Partially MET 02/14/2017 Pt verbalizes & demo HEP instructed but limited due to not scheduling PT for over 2 weeks after evaluation.    Time 4   Period Weeks   Status Partially Met     PT SHORT TERM GOAL #2   Title Patient's gait speed will be >/= 1.57f/s to indicate a decresae in risk of falling. (TARGET DATE: 02/18/2017)    Time 4   Period Weeks   Status Achieved     PT SHORT TERM GOAL #3   Title Patient's cognitive TUG score will be </=20% greater than standard TUG to indicate a decrease in risk of falling while completing attention demanding tasks. (TARGET DATE: 02/18/2017)    Baseline normal: 30.2 sec.  and cognitive: 31.2 sec.   Time 4   Period Weeks   Status Not Met     PT SHORT TERM GOAL #4   Title PT will perform 6 minute walk test, and patient will score >/= 737fmore than her baseline measurement to indicate improvement in her endurance. (TARGET DATE: 02/18/2017)    Baseline 579'   Time 4   Period Weeks   Status Not Met     PT SHORT TERM GOAL #5   Title Patient will demonstrate ability to ambulate on stairs (2 rails), ramp/curb with min guard to indicate a decrease in risk of falling. (TARGET DATE: 02/18/2017)    Baseline MET 02/14/2017   Time 4   Period Weeks   Status Achieved     PT SHORT TERM GOAL #6   Title Patient will demonstrate ability to ambulate 25069fith RW with supervision to indicate a decrease in risk of falling. (TARGET DATE: 02/18/2017)    Time 4    Period Weeks   Status Partially Met           PT Long Term Goals - 02/28/17 2145      PT LONG TERM GOAL #1   Title Patient will verbalize understanding and return demonstration for initial HEP to increase LE strength and balance to decrease risk of falling. (TARGET DATE: 03/18/2017)    Time 8   Period Weeks   Status On-going     PT LONG TERM GOAL #2   Title Patient's gait speed will  be >/= 1.69f/s with RW to indicate a decresae in her risk of repeated falls. (TARGET DATE: 03/18/2017)    Time 8   Period Weeks   Status On-going     PT LONG TERM GOAL #3   Title Patient's cognitive TUG score will be </= 10% greater than standard TUG score to indicate a decrease in risk of falling when completing attention demanding tasks. (TARGET DATE: 03/18/2017)    Baseline normal: 30.2 sec. and cognitive: 31.2 sec.   Time 8   Period Weeks   Status On-going     PT LONG TERM GOAL #4   Title Patient will score >/= 40/56 on the Berg Balance Test to indicate a decrease in risk of falling. (TARGET DATE: 03/18/2017)    Time 8   Period Weeks   Status On-going     PT LONG TERM GOAL #5   Title Patient will improve her ABC (Activities of Balance Confidence) FOTO score by >/=10% to indicate improvement in self-perceived functional mobility. (TARGET DATE: 03/18/2017)    Time 8   Period Weeks   Status On-going     PT LONG TERM GOAL #6   Title Patient will demonstrate ability to ambulte on stairs (1 rail), ramp, curb with supervision and LRAD to indicate a decrease in risk of falling when returning to full community ambulation. (TARGET DATE: 03/18/2017)    Status On-going     PT LONG TERM GOAL #7   Title Patient will be able to ambulate 5065fincluding outdoor surfaces with LRAD and supervision to indicate a decrease in risk of falling when ambulating in the yard and community. (TARGET DATE: 03/18/2017)    Time 8   Period Weeks   Status On-going     PT LONG TERM GOAL #8   Title Patient will  demonstrate ability to lift/carry objects (simulating lawn care materials) on outdoor (grass/gravel) surfaces for 2071fith supervision to indicate a decrease in risk of falling when performing lawn care work. (TARGET DATE: 03/18/2017)    Time 8   Period Weeks   Status On-going     PT LONG TERM GOAL  #9   TITLE PT will perform 6 minute walk test, and patient will improve by >/=150f66fom baseline measurement with stable vital signs to indicate improvment in endurance. (TARGET DATE: 03/18/2017)    Baseline 579' initil 6-minute walk test   Time 8   Period Weeks   Status On-going               Plan - 03/02/17 1946    Clinical Impression Statement Patient appears that she could safely use a rollator walker with further skilled PT training. This patient enjoys going outdoors and a rollator walker would provide stability & assistance to enable some outdoor activities.    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   Consulted and Agree with Plan of Care Family member/caregiver;Patient   Family Member Consulted caregiver - Kim       Patient  will benefit from skilled therapeutic intervention in order to improve the following deficits and impairments:  Abnormal gait, Decreased activity tolerance, Decreased balance, Decreased coordination, Decreased  safety awareness, Decreased range of motion, Decreased mobility, Decreased knowledge of use of DME, Decreased endurance, Decreased strength, Difficulty walking, Dizziness, Cardiopulmonary status limiting activity  Visit Diagnosis: Muscle weakness (generalized)  Other abnormalities of gait and mobility  Unsteadiness on feet     Problem List Patient Active Problem List   Diagnosis Date Noted  . Fall 11/18/2015  . Multiple fractures of ribs of right side 11/18/2015  . Traumatic pneumothorax 11/18/2015  . Muscle spasms of neck 03/23/2015  . Acute encephalopathy 03/22/2015  . UTI (lower urinary tract infection) 03/22/2015  . Anxiety 03/22/2015  . Hyponatremia 03/22/2015  . Gait abnormality 05/11/2012  . Weakness 05/11/2012    Louise Victory PT, DPT 03/03/2017, 9:57 AM  [facility] [address] [place], [place], [address] Phone: [phone]   Fax:  [phone]  Name: Mackenzie Blanchard MRN: [id number] Date of Birth: 07-18-1941

## 2017-03-07 ENCOUNTER — Encounter: Payer: Self-pay | Admitting: Physical Therapy

## 2017-03-07 ENCOUNTER — Ambulatory Visit: Payer: Medicare Other | Admitting: Physical Therapy

## 2017-03-07 DIAGNOSIS — R2689 Other abnormalities of gait and mobility: Secondary | ICD-10-CM

## 2017-03-07 DIAGNOSIS — R2681 Unsteadiness on feet: Secondary | ICD-10-CM

## 2017-03-07 DIAGNOSIS — R42 Dizziness and giddiness: Secondary | ICD-10-CM

## 2017-03-07 DIAGNOSIS — M6281 Muscle weakness (generalized): Secondary | ICD-10-CM

## 2017-03-07 NOTE — Therapy (Signed)
[facility] [address] [place], [place], [address] Phone: [phone]   Fax:  [phone]  Physical Therapy Treatment  Patient Details  Name: Mackenzie Blanchard MRN: [id number] Date of Birth: 10/31/1940 Referring Provider: Lajean Manes MD   Encounter Date: 03/07/2017      PT End of Session - 03/07/17 2226    Visit Number 10   Number of Visits 17   Date for PT Re-Evaluation 03/20/17   Authorization Type Medicare and G-codes every 10th visit    PT Start Time 1230   PT Stop Time 1316   PT Time Calculation (min) 46 min   Equipment Utilized During Treatment Gait belt   Activity Tolerance Patient tolerated treatment well   Behavior During Therapy [facility] for tasks assessed/performed      Past Medical History:  Diagnosis Date  . Anxiety   . Arthritis    "neck and upper back" (11/18/2015)  . Chronic right shoulder pain    w/limited ROM (11/18/2015)  . Colon cancer ([place])   . Depression   . Edema    idiopathic  . Fall at home 11/18/2015   right rib fractures with pneumothorax  . Hashimoto's disease   . Hyperlipidemia   . Hypothyroidism   . Osteopenia   . Sinusitis, chronic     Past Surgical History:  Procedure Laterality Date  . CATARACT EXTRACTION W/ INTRAOCULAR LENS  IMPLANT, BILATERAL Bilateral    bilaterally  . COLECTOMY  05/2013  . COLONOSCOPY WITH PROPOFOL N/A 11/16/2012   Procedure: COLONOSCOPY WITH PROPOFOL;  Surgeon: Garlan Fair, MD;  Location: [facility];  Service: Endoscopy;  Laterality: N/A;  h&p in file-hope  . TONSILLECTOMY    . TUBAL LIGATION      There were no vitals filed for this visit.      Subjective Assessment - 03/07/17 1231    Subjective She purchased a rollator walker and recieved it on Friday. She used it for first time today. She has Baker's Cyst drained on Thursday and right leg is sore.    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 No/denies            [facility] PT Assessment - 03/07/17 1230      Timed Up and Go Test   Normal TUG (seconds) 19.22  15.89sec no device, 19.22sec RW                     [facility] Adult PT Treatment/Exercise - 03/07/17 1230      Transfers   Transfers Sit to Stand;Stand to [employer] Transfers   Sit to Stand 5: Supervision;With upper extremity assist;From chair/3-in-1;With armrests  to rollator walker   Sit to Stand Details (indicate cue type and reason) verbal cues on rollator safety   Stand to Sit 5: Supervision;With upper extremity assist;With armrests;To chair/3-in-1  from rollator walker   Stand to Sit Details verbal cues on rollator safety   Stand Pivot Transfers 5: Supervision;With armrests  180* to sit/stand on rollator walker seat   Stand Pivot Transfer Details (indicate cue type and reason) verbal cues on rollator walker safety.     Ambulation/Gait   Ambulation/Gait Yes  Ambulation/Gait Assistance 4: Min guard;5: Supervision  min guard on grass.    Ambulation/Gait Assistance Details verbal cues on rollator walker safety including positioning within rollator & hand position on grip   Ambulation Distance (Feet) 600 Feet  250' indoors & 600' outdoors   Assistive device Rollator   Gait Pattern Step-through pattern;Decreased step length - left;Decreased stance time - right;Decreased stride length;Decreased weight shift to right;Trunk flexed   Ambulation Surface Indoor;Level;Outdoor;Paved;Gravel;Grass   Ramp 4: Min assist  rollator walker   Ramp Details (indicate cue type and reason) verbal cues on rollator safety including brake management and posture   Curb 4: Min assist  rollator walker    Curb Details (indicate cue type and reason) verbal & manual cues on rollator safety, movement and sequence.                 PT Education - 03/07/17 1230    Education provided Yes   Education Details Baker's cyst info, elevation positioning and use of pillows for positioning in bed   Person(s) Educated Patient;Caregiver(s)   Methods Explanation;Demonstration;Verbal cues;Handout   Comprehension Verbalized understanding;Verbal cues required;Need further instruction          PT Short Term Goals - 02/16/17 1316      PT SHORT TERM GOAL #1   Title Patient will verbalize understanding and return demonstration for initial HEP to increase LE strength and balance to reduce risk of falling. (TARGET DATE: 02/18/2017)    Baseline Partially MET 02/14/2017 Pt verbalizes & demo HEP instructed but limited due to not scheduling PT for over 2 weeks after evaluation.    Time 4   Period Weeks   Status Partially Met     PT SHORT TERM GOAL #2   Title Patient's gait speed will be >/= 1.40f/s to indicate a decresae in risk of falling. (TARGET DATE: 02/18/2017)    Time 4   Period Weeks   Status Achieved     PT SHORT TERM GOAL #3   Title Patient's cognitive TUG score will be </=20% greater than standard TUG to indicate a decrease in risk of falling while completing attention demanding tasks. (TARGET DATE: 02/18/2017)    Baseline normal: 30.2 sec.  and cognitive: 31.2 sec.   Time 4   Period Weeks   Status Not Met     PT SHORT TERM GOAL #4   Title PT will perform 6 minute walk test, and patient will score >/= 774fmore than her baseline measurement to indicate improvement in her endurance. (TARGET DATE: 02/18/2017)    Baseline 579'   Time 4   Period Weeks   Status Not Met     PT SHORT TERM GOAL #5   Title Patient will demonstrate ability to ambulate on stairs (2 rails), ramp/curb with min guard to indicate a decrease in risk of falling. (TARGET DATE: 02/18/2017)    Baseline MET 02/14/2017   Time  4   Period Weeks   Status Achieved     PT SHORT TERM GOAL #6   Title Patient will demonstrate ability to ambulate 2501fith RW with supervision to indicate a decrease in risk of falling. (TARGET DATE: 02/18/2017)    Time 4   Period Weeks   Status Partially Met           PT Long Term Goals - 02/28/17 2145      PT LONG TERM GOAL #1   Title Patient will verbalize understanding and return demonstration for initial HEP to increase LE  strength and balance to decrease risk of falling. (TARGET DATE: 03/18/2017)    Time 8   Period Weeks   Status On-going     PT LONG TERM GOAL #2   Title Patient's gait speed will be >/= 1.53ft/s with RW to indicate a decresae in her risk of repeated falls. (TARGET DATE: 03/18/2017)    Time 8   Period Weeks   Status On-going     PT LONG TERM GOAL #3   Title Patient's cognitive TUG score will be </= 10% greater than standard TUG score to indicate a decrease in risk of falling when completing attention demanding tasks. (TARGET DATE: 03/18/2017)    Baseline normal: 30.2 sec. and cognitive: 31.2 sec.   Time 8   Period Weeks   Status On-going     PT LONG TERM GOAL #4   Title Patient will score >/= 40/56 on the Berg Balance Test to indicate a decrease in risk of falling. (TARGET DATE: 03/18/2017)    Time 8   Period Weeks   Status On-going     PT LONG TERM GOAL #5   Title Patient will improve her ABC (Activities of Balance Confidence) FOTO score by >/=10% to indicate improvement in self-perceived functional mobility. (TARGET DATE: 03/18/2017)    Time 8   Period Weeks   Status On-going     PT LONG TERM GOAL #6   Title Patient will demonstrate ability to ambulte on stairs (1 rail), ramp, curb with supervision and LRAD to indicate a decrease in risk of falling when returning to full community ambulation. (TARGET DATE: 03/18/2017)    Status On-going     PT LONG TERM GOAL #7   Title Patient will be able to ambulate 524ft including outdoor surfaces with LRAD  and supervision to indicate a decrease in risk of falling when ambulating in the yard and community. (TARGET DATE: 03/18/2017)    Time 8   Period Weeks   Status On-going     PT LONG TERM GOAL #8   Title Patient will demonstrate ability to lift/carry objects (simulating lawn care materials) on outdoor (grass/gravel) surfaces for 51ft with supervision to indicate a decrease in risk of falling when performing lawn care work. (TARGET DATE: 03/18/2017)    Time 8   Period Weeks   Status On-going     PT LONG TERM GOAL  #9   TITLE PT will perform 6 minute walk test, and patient will improve by >/=123ft from baseline measurement with stable vital signs to indicate improvment in endurance. (TARGET DATE: 03/18/2017)    Baseline 579' initil 6-minute walk test   Time 8   Period Weeks   Status On-going               Plan - 03/07/17 2227    Clinical Impression Statement Patient improved time on Timed Up & Go test which indicates lower fall risk. Patient purchased rollator walker with larger wheels. Rollator walker improved safety & mobility including grass.    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   Consulted and Agree with Plan of Care Family member/caregiver;Patient   Family Member Consulted caregiver - Kim       Patient will benefit from skilled therapeutic intervention in  order to improve the following deficits and impairments:  Abnormal gait, Decreased activity tolerance, Decreased balance, Decreased coordination, Decreased safety awareness, Decreased range of motion, Decreased mobility, Decreased knowledge of use of DME, Decreased endurance, Decreased strength, Difficulty walking, Dizziness, Cardiopulmonary status limiting activity  Visit Diagnosis: Muscle weakness (generalized)  Other abnormalities of gait and mobility  Unsteadiness on feet  Dizziness and giddiness       G-Codes - 03/09/17 2231    Functional Assessment Tool Used (Outpatient Only) Timed Up & Go with standard RW 19.22sec and without device 15.89sec with contact assist   Functional Limitation Mobility: Walking and moving around   Mobility: Walking and Moving Around Current Status ([id number]) At least 40 percent but less than 60 percent impaired, limited or restricted   Mobility: Walking and Moving Around Goal Status [phone]) At least 40 percent but less than 60 percent impaired, limited or restricted      Problem List Patient Active Problem List   Diagnosis Date Noted  . Fall 11/18/2015  . Multiple fractures of ribs of right side 11/18/2015  . Traumatic pneumothorax 11/18/2015  . Muscle spasms of neck 03/23/2015  . Acute encephalopathy 03/22/2015  . UTI (lower urinary tract infection) 03/22/2015  . Anxiety 03/22/2015  . Hyponatremia 03/22/2015  . Gait abnormality 05/11/2012  . Weakness 05/11/2012    Greogry Goodwyn PT, DPT 03-09-17, 10:32 PM  [facility] [address] [place], [place], [address] Phone: [phone]   Fax:  [phone]  Name: Mackenzie Blanchard MRN: [id number] Date of Birth: Jan 01, 1941

## 2017-03-07 NOTE — Patient Instructions (Signed)
Baker Cyst A Baker cyst, also called a popliteal cyst, is a sac-like growth that forms at the back of the knee. The cyst forms when the fluid-filled sac (bursa) that cushions the knee joint becomes enlarged. The bursa that becomes a Baker cyst is located at the back of the knee joint. What are the causes? In most cases, a Baker cyst results from another knee problem that causes swelling inside the knee. This makes the fluid inside the knee joint (synovial fluid) flow into the bursa behind the knee, causing the bursa to enlarge. What increases the risk? You may be more likely to develop a Baker cyst if you already have a knee problem, such as:  A tear in cartilage that cushions the knee joint (meniscal tear).  A tear in the tissues that connect the bones of the knee joint (ligament tear).  Knee swelling from osteoarthritis, rheumatoid arthritis, or gout.  What are the signs or symptoms? A Baker cyst does not always cause symptoms. A lump behind the knee may be the only sign of the condition. The lump may be painful, especially when the knee is straightened. If the lump is painful, the pain may come and go. The knee may also be stiff. Symptoms may quickly get more severe if the cyst breaks open (ruptures). If your cyst ruptures, signs and symptoms may affect the knee and the back of the lower leg (calf) and may include:  Sudden or worsening pain.  Swelling.  Bruising.  How is this diagnosed? This condition may be diagnosed based on your symptoms and medical history. Your health care provider will also do a physical exam. This may include:  Feeling the cyst to check whether it is tender.  Checking your knee for signs of another knee condition that causes swelling.  You may have imaging tests, such as:  X-rays.  MRI.  Ultrasound.  How is this treated? A Baker cyst that is not painful may go away without treatment. If the cyst gets large or painful, it will likely get better if the  underlying knee problem is treated. Treatment for a Baker cyst may include:  Resting.  Keeping weight off of the knee. This means not leaning on the knee to support your body weight.  NSAIDs to reduce pain and swelling.  A procedure to drain the fluid from the cyst with a needle (aspiration). You may also get an injection of a medicine that reduces swelling (steroid).  Surgery. This may be needed if other treatments do not work. This usually involves correcting knee damage and removing the cyst.  Follow these instructions at home:  Take over-the-counter and prescription medicines only as told by your health care provider.  Rest and return to your normal activities as told by your health care provider. Avoid activities that make pain or swelling worse. Ask your health care provider what activities are safe for you.  Keep all follow-up visits as told by your health care provider. This is important. Contact a health care provider if:  You have knee pain, stiffness, or swelling that does not get better. Get help right away if:  You have sudden or worsening pain and swelling in your calf area. This information is not intended to replace advice given to you by your health care provider. Make sure you discuss any questions you have with your health care provider. Document Released: 07/19/2005 Document Revised: 04/08/2016 Document Reviewed: 04/08/2016 [employer] Interactive Patient Education  2018 [employer]  

## 2017-03-09 ENCOUNTER — Encounter: Payer: Self-pay | Admitting: Physical Therapy

## 2017-03-09 ENCOUNTER — Ambulatory Visit: Payer: Medicare Other | Admitting: Physical Therapy

## 2017-03-09 DIAGNOSIS — M6281 Muscle weakness (generalized): Secondary | ICD-10-CM

## 2017-03-09 DIAGNOSIS — R2689 Other abnormalities of gait and mobility: Secondary | ICD-10-CM

## 2017-03-09 DIAGNOSIS — R42 Dizziness and giddiness: Secondary | ICD-10-CM

## 2017-03-09 DIAGNOSIS — R2681 Unsteadiness on feet: Secondary | ICD-10-CM

## 2017-03-09 NOTE — Therapy (Signed)
[facility] [address] [place], [place], [address] Phone: [phone]   Fax:  [phone]  Physical Therapy Treatment  Patient Details  Name: Mackenzie Blanchard MRN: [id number] Date of Birth: June 27, 1941 Referring Provider: Lajean Manes MD   Encounter Date: 03/09/2017      PT End of Session - 03/09/17 1328    Visit Number 11   Number of Visits 17   Date for PT Re-Evaluation 03/20/17   Authorization Type Medicare and G-codes every 10th visit    PT Start Time 1322   PT Stop Time 1401   PT Time Calculation (min) 39 min   Equipment Utilized During Treatment Gait belt  rollator   Activity Tolerance Patient tolerated treatment well   Behavior During Therapy WFL for tasks assessed/performed      Past Medical History:  Diagnosis Date  . Anxiety   . Arthritis    "neck and upper back" (11/18/2015)  . Chronic right shoulder pain    w/limited ROM (11/18/2015)  . Colon cancer ([place])   . Depression   . Edema    idiopathic  . Fall at home 11/18/2015   right rib fractures with pneumothorax  . Hashimoto's disease   . Hyperlipidemia   . Hypothyroidism   . Osteopenia   . Sinusitis, chronic     Past Surgical History:  Procedure Laterality Date  . CATARACT EXTRACTION W/ INTRAOCULAR LENS  IMPLANT, BILATERAL Bilateral    bilaterally  . COLECTOMY  05/2013  . COLONOSCOPY WITH PROPOFOL N/A 11/16/2012   Procedure: COLONOSCOPY WITH PROPOFOL;  Surgeon: Garlan Fair, MD;  Location: [facility];  Service: Endoscopy;  Laterality: N/A;  h&p in file-[facility]  . TONSILLECTOMY    . TUBAL LIGATION      There were no vitals filed for this visit.      Subjective Assessment - 03/09/17 1326    Subjective Pt reports no falls. Knee still hurts from the Bakers cyst. Has not been elevating it.    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 Yes   Pain Score 1    Pain Location Knee   Pain Orientation Right   Pain Descriptors / Indicators --  Pain from the Bakers cyst still sore                         [facility] Adult PT Treatment/Exercise - 03/09/17 1349      Ambulation/Gait   Ambulation/Gait Yes   Ambulation/Gait Assistance 4: Min guard   Ambulation/Gait Assistance Details verbal/tactile cues to extend thorax and tuck chin; verbal, tactile and demo cues to manage walker, control step length   Ambulation Distance (Feet) 600 Feet  600' outside, 150' inside   Assistive device Rollator   Gait Pattern Step-through pattern;Shuffle;Trunk flexed   Ambulation Surface Level;Indoor;Outdoor;Paved;Gravel;Grass;Unlevel     Posture/Postural Control   Posture/Postural Control Postural limitations   Postural Limitations Rounded Shoulders;Forward head   Posture Comments Doorway stretches to improve upper thoracic extension     Self-Care   Self-Care Other Self-Care Comments   Other Self-Care Comments  PT demo watering garden using hose and rollator  PT Education - 03/09/17 1343    Education provided Yes   Education Details Pt reminded about elevating R LE for cyst, Pt and caregiver shown transfers of rollator in/out of car   Person(s) Educated Patient;Caregiver(s)   Methods Explanation;Demonstration;Verbal cues   Comprehension Verbalized understanding          PT Short Term Goals - 02/16/17 1316      PT SHORT TERM GOAL #1   Title Patient will verbalize understanding and return demonstration for initial HEP to increase LE strength and balance to reduce risk of falling. (TARGET DATE: 02/18/2017)    Baseline Partially MET 02/14/2017 Pt verbalizes & demo HEP instructed but limited due  to not scheduling PT for over 2 weeks after evaluation.    Time 4   Period Weeks   Status Partially Met     PT SHORT TERM GOAL #2   Title Patient's gait speed will be >/= 1.19f/s to indicate a decresae in risk of falling. (TARGET DATE: 02/18/2017)    Time 4   Period Weeks   Status Achieved     PT SHORT TERM GOAL #3   Title Patient's cognitive TUG score will be </=20% greater than standard TUG to indicate a decrease in risk of falling while completing attention demanding tasks. (TARGET DATE: 02/18/2017)    Baseline normal: 30.2 sec.  and cognitive: 31.2 sec.   Time 4   Period Weeks   Status Not Met     PT SHORT TERM GOAL #4   Title PT will perform 6 minute walk test, and patient will score >/= 756fmore than her baseline measurement to indicate improvement in her endurance. (TARGET DATE: 02/18/2017)    Baseline 579'   Time 4   Period Weeks   Status Not Met     PT SHORT TERM GOAL #5   Title Patient will demonstrate ability to ambulate on stairs (2 rails), ramp/curb with min guard to indicate a decrease in risk of falling. (TARGET DATE: 02/18/2017)    Baseline MET 02/14/2017   Time 4   Period Weeks   Status Achieved     PT SHORT TERM GOAL #6   Title Patient will demonstrate ability to ambulate 2501fith RW with supervision to indicate a decrease in risk of falling. (TARGET DATE: 02/18/2017)    Time 4   Period Weeks   Status Partially Met           PT Long Term Goals - 03/09/17 2013      PT LONG TERM GOAL #1   Title Patient will verbalize understanding and return demonstration for HEP to increase LE strength and balance to decrease risk of falling. (TARGET DATE: 03/18/2017)    Time 8   Period Weeks   Status On-going     PT LONG TERM GOAL #2   Title Patient's gait speed will be >/= 1.8ft17fwith RW to indicate a decresae in her risk of repeated falls. (TARGET DATE: 03/18/2017)    Time 8   Period Weeks   Status On-going     PT LONG TERM GOAL #3   Title Patient's cognitive  TUG score will be </= 10% greater than standard TUG score to indicate a decrease in risk of falling when completing attention demanding tasks. (TARGET DATE: 03/18/2017)    Baseline normal: 30.2 sec. and cognitive: 31.2 sec.   Time 8   Period Weeks   Status Achieved     PT LONG TERM GOAL #4   Title Patient  will score >/= 40/56 on the Berg Balance Test to indicate a decrease in risk of falling. (TARGET DATE: 03/18/2017)    Time 8   Period Weeks   Status On-going     PT LONG TERM GOAL #5   Title Patient will improve her ABC (Activities of Balance Confidence) FOTO score by >/=10% to indicate improvement in self-perceived functional mobility. (TARGET DATE: 03/18/2017)    Time 8   Period Weeks   Status On-going     PT LONG TERM GOAL #6   Title Patient will demonstrate ability to ambulate on stairs (1 rail), ramp, curb with supervision and LRAD to indicate a decrease in risk of falling when returning to full community ambulation. (TARGET DATE: 03/18/2017)    Status On-going     PT LONG TERM GOAL #7   Title Patient will be able to ambulate 567f including outdoor surfaces with LRAD and supervision to indicate a decrease in risk of falling when ambulating in the yard and community. (TARGET DATE: 03/18/2017)    Time 8   Period Weeks   Status On-going     PT LONG TERM GOAL #8   Title Patient & caregiver report ability to lift/carry objects (sOrthoptist on outdoor (grass/gravel) surfaces using rollator for 258fwith supervision to indicate a decrease in risk of falling when performing lawn care work. (TARGET DATE: 03/18/2017)    Time 8   Period Weeks   Status Revised     PT LONG TERM GOAL  #9   TITLE PT will perform 6 minute walk test, and patient will improve by >/=1502from baseline measurement with stable vital signs to indicate improvment in endurance. (TARGET DATE: 03/18/2017)    Baseline 579' initial 6-minute walk test   Status Deferred               Plan -  03/09/17 1408    Clinical Impression Statement Today's session focused on thoracic extension exercises and ambulation with rollator on a variety of surfaces. Pt able to extend thorax and manage walker but requires visual/verbal/tactile/demo cues to maintain. Pt should continue to show improvements with continued skilled therapy.   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     PT Frequency 2x / week   PT Duration 8 weeks   PT Treatment/Interventions ADLs/Self Care Home Management;Neuromuscular re-education;Balance training;Therapeutic exercise;Therapeutic activities;Functional mobility training;Stair training;Gait training;DME Instruction;Patient/family education;Energy conservation   PT Next Visit Plan Gait with rollator, postural exercises, balance exercises, rollator management   Consulted and Agree with Plan of Care Family member/caregiver;Patient   Family Member Consulted caregiver - Kim       Patient will benefit from skilled therapeutic intervention in order to improve the following deficits and impairments:  Abnormal gait, Decreased activity tolerance, Decreased balance, Decreased coordination, Decreased safety awareness, Decreased range of motion, Decreased mobility, Decreased knowledge of use of DME, Decreased endurance, Decreased strength, Difficulty walking, Dizziness, Cardiopulmonary status limiting activity  Visit Diagnosis: Muscle weakness (generalized)  Other abnormalities of gait and mobility  Unsteadiness on feet  Dizziness and giddiness     Problem List Patient Active Problem List   Diagnosis Date Noted  . Fall 11/18/2015  . Multiple fractures of ribs of right side 11/18/2015  .  Traumatic pneumothorax 11/18/2015  . Muscle spasms of neck 03/23/2015  . Acute encephalopathy 03/22/2015  . UTI (lower urinary tract infection) 03/22/2015  . Anxiety 03/22/2015  . Hyponatremia 03/22/2015  . Gait abnormality 05/11/2012  . Weakness 05/11/2012   Gershon Crane, SPT 03/09/2017, 2:18 PM  Jamey Reas, PT, DPT 03/09/2017, 8:17 PM  [facility] [address] [place], [place], [address] Phone: [phone]   Fax:  [phone]  Name: Mackenzie Blanchard MRN: [id number] Date of Birth: 1940/10/03

## 2017-03-10 DIAGNOSIS — H353132 Nonexudative age-related macular degeneration, bilateral, intermediate dry stage: Secondary | ICD-10-CM | POA: Diagnosis not present

## 2017-03-10 DIAGNOSIS — H43813 Vitreous degeneration, bilateral: Secondary | ICD-10-CM | POA: Diagnosis not present

## 2017-03-10 DIAGNOSIS — H47313 Coloboma of optic disc, bilateral: Secondary | ICD-10-CM | POA: Diagnosis not present

## 2017-03-14 ENCOUNTER — Ambulatory Visit: Payer: Medicare Other | Admitting: Physical Therapy

## 2017-03-16 ENCOUNTER — Ambulatory Visit: Payer: Medicare Other | Admitting: Physical Therapy

## 2017-03-16 ENCOUNTER — Encounter: Payer: Self-pay | Admitting: Physical Therapy

## 2017-03-16 DIAGNOSIS — R42 Dizziness and giddiness: Secondary | ICD-10-CM

## 2017-03-16 DIAGNOSIS — R2681 Unsteadiness on feet: Secondary | ICD-10-CM

## 2017-03-16 DIAGNOSIS — M6281 Muscle weakness (generalized): Secondary | ICD-10-CM

## 2017-03-16 DIAGNOSIS — R2689 Other abnormalities of gait and mobility: Secondary | ICD-10-CM

## 2017-03-16 DIAGNOSIS — R300 Dysuria: Secondary | ICD-10-CM | POA: Diagnosis not present

## 2017-03-17 NOTE — Therapy (Signed)
[facility] [address] [place], [place], [address] Phone: [phone]   Fax:  [phone]  Physical Therapy Treatment  Patient Details  Name: Mackenzie Blanchard MRN: [id number] Date of Birth: 09-Nov-1940 Referring Provider: Lajean Manes MD   Encounter Date: 03/16/2017      PT End of Session - 03/16/17 1902    Visit Number 12   Number of Visits 17   Date for PT Re-Evaluation 03/20/17   Authorization Type Medicare and G-codes every 10th visit    PT Start Time 1320   PT Stop Time 1400   PT Time Calculation (min) 40 min   Equipment Utilized During Treatment Gait belt  rollator   Activity Tolerance Patient tolerated treatment well   Behavior During Therapy [facility] for tasks assessed/performed      Past Medical History:  Diagnosis Date  . Anxiety   . Arthritis    "neck and upper back" (11/18/2015)  . Chronic right shoulder pain    w/limited ROM (11/18/2015)  . Colon cancer ([place])   . Depression   . Edema    idiopathic  . Fall at home 11/18/2015   right rib fractures with pneumothorax  . Hashimoto's disease   . Hyperlipidemia   . Hypothyroidism   . Osteopenia   . Sinusitis, chronic     Past Surgical History:  Procedure Laterality Date  . CATARACT EXTRACTION W/ INTRAOCULAR LENS  IMPLANT, BILATERAL Bilateral    bilaterally  . COLECTOMY  05/2013  . COLONOSCOPY WITH PROPOFOL N/A 11/16/2012   Procedure: COLONOSCOPY WITH PROPOFOL;  Surgeon: Garlan Fair, MD;  Location: [facility];  Service: Endoscopy;  Laterality: N/A;  h&p in file-[facility]  . TONSILLECTOMY    . TUBAL LIGATION      There were no vitals filed for this visit.      Subjective Assessment - 03/16/17 1322    Subjective She thinks she has a bladder infection and has a 2:45 doctor appt today. No falls. 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 No/denies            [facility] PT Assessment - 03/16/17 1315      Ambulation/Gait   Ambulation/Gait Yes   Ambulation/Gait Assistance 5: Supervision   Ambulation Distance (Feet) 600 Feet   Assistive device Rollator   Ambulation Surface Outdoor;Paved;Gravel;Grass   Stairs Yes   Stairs Assistance 5: Supervision   Stair Management Technique One rail Left;Step to pattern;Two rails;Alternating pattern;Forwards  alternating ascend, step-to descend   Number of Stairs 4   Ramp 5: Supervision  rollator walker   Curb 5: Supervision  rollator walker     Berg Balance Test   Sit to Stand Able to stand  independently using hands   Standing Unsupported Able to stand safely 2 minutes   Sitting with Back Unsupported but Feet Supported on Floor or Stool Able to sit safely and securely 2 minutes   Stand to Sit Sits safely with minimal use of hands   Transfers Able to transfer safely, minor use of hands   Standing Unsupported with Eyes Closed Able to stand 10 seconds safely  Standing Ubsupported with Feet Together Able to place feet together independently and stand 1 minute safely   From Standing, Reach Forward with Outstretched Arm Can reach forward >5 cm safely (2")   From Standing Position, Pick up Object from [place] to pick up shoe safely and easily   From Standing Position, Turn to Look Behind Over each Shoulder Looks behind from both sides and weight shifts well   Turn 360 Degrees Able to turn 360 degrees safely but slowly   Standing Unsupported, Alternately Place Feet on Step/Stool Able to complete 4 steps without aid or supervision   Standing Unsupported, One Foot in Front Able to take small step independently and hold 30  seconds   Standing on One Leg Tries to lift leg/unable to hold 3 seconds but remains standing independently   Total Score 44   Berg comment: Initial was 31/56     Timed Up and Go Test   TUG Cognitive TUG;Normal TUG   Normal TUG (seconds) 14.12  14.12 sec no device & 19.01 sec RW   Cognitive TUG (seconds) 15.53   TUG Comments 9.9% increase                               PT Short Term Goals - 02/16/17 1316      PT SHORT TERM GOAL #1   Title Patient will verbalize understanding and return demonstration for initial HEP to increase LE strength and balance to reduce risk of falling. (TARGET DATE: 02/18/2017)    Baseline Partially MET 02/14/2017 Pt verbalizes & demo HEP instructed but limited due to not scheduling PT for over 2 weeks after evaluation.    Time 4   Period Weeks   Status Partially Met     PT SHORT TERM GOAL #2   Title Patient's gait speed will be >/= 1.55f/s to indicate a decresae in risk of falling. (TARGET DATE: 02/18/2017)    Time 4   Period Weeks   Status Achieved     PT SHORT TERM GOAL #3   Title Patient's cognitive TUG score will be </=20% greater than standard TUG to indicate a decrease in risk of falling while completing attention demanding tasks. (TARGET DATE: 02/18/2017)    Baseline normal: 30.2 sec.  and cognitive: 31.2 sec.   Time 4   Period Weeks   Status Not Met     PT SHORT TERM GOAL #4   Title PT will perform 6 minute walk test, and patient will score >/= 728fmore than her baseline measurement to indicate improvement in her endurance. (TARGET DATE: 02/18/2017)    Baseline 579'   Time 4   Period Weeks   Status Not Met     PT SHORT TERM GOAL #5   Title Patient will demonstrate ability to ambulate on stairs (2 rails), ramp/curb with min guard to indicate a decrease in risk of falling. (TARGET DATE: 02/18/2017)    Baseline MET 02/14/2017   Time 4   Period Weeks   Status Achieved     PT SHORT TERM GOAL #6   Title Patient will  demonstrate ability to ambulate 2506fith RW with supervision to indicate a decrease in risk of falling. (TARGET DATE: 02/18/2017)    Time 4   Period Weeks   Status Partially Met           PT Long Term Goals - 03/16/17 1902      PT LONG TERM  GOAL #1   Title Patient will verbalize understanding and return demonstration for HEP to increase LE strength and balance to decrease risk of falling. (TARGET DATE: 03/18/2017)    Time 8   Period Weeks   Status On-going     PT LONG TERM GOAL #2   Title Patient's gait speed will be >/= 1.55f/s with RW to indicate a decresae in her risk of repeated falls. (TARGET DATE: 03/18/2017)    Time 8   Period Weeks   Status On-going     PT LONG TERM GOAL #3   Title Patient's cognitive TUG score will be </= 10% greater than standard TUG score to indicate a decrease in risk of falling when completing attention demanding tasks. (TARGET DATE: 03/18/2017)    Baseline MET 03/16/2017  No device std TUG 14.12 sec and cog TUG 15.53sec (9.9% increase)   Time 8   Period Weeks   Status Achieved     PT LONG TERM GOAL #4   Title Patient will score >/= 40/56 on the Berg Balance Test to indicate a decrease in risk of falling. (TARGET DATE: 03/18/2017)    Baseline MET 03/16/2017  BMerrilee JanskyBalance 44/56   Time 8   Period Weeks   Status Achieved     PT LONG TERM GOAL #5   Title Patient will improve her ABC (Activities of Balance Confidence) FOTO score by >/=10% to indicate improvement in self-perceived functional mobility. (TARGET DATE: 03/18/2017)    Time 8   Period Weeks   Status On-going     PT LONG TERM GOAL #6   Title Patient will demonstrate ability to ambulate on stairs (1 rail), ramp, curb with supervision and LRAD to indicate a decrease in risk of falling when returning to full community ambulation. (TARGET DATE: 03/18/2017)    Baseline MET 03/16/2017 using rollator walker on ramps & curbs   Status Achieved     PT LONG TERM GOAL #7   Title Patient will be able to  ambulate 501fincluding outdoor surfaces with LRAD and supervision to indicate a decrease in risk of falling when ambulating in the yard and community. (TARGET DATE: 03/18/2017)    Time 8   Period Weeks   Status On-going     PT LONG TERM GOAL #8   Title Patient & caregiver report ability to lift/carry objects (siOrthoptiston outdoor (grass/gravel) surfaces using rollator for 2075fith supervision to indicate a decrease in risk of falling when performing lawn care work. (TARGET DATE: 03/18/2017)    Time 8   Period Weeks   Status Revised     PT LONG TERM GOAL  #9   TITLE PT will perform 6 minute walk test, and patient will improve by >/=150f36fom baseline measurement with stable vital signs to indicate improvment in endurance. (TARGET DATE: 03/18/2017)    Baseline 579' initial 6-minute walk test   Status Deferred               Plan - 03/16/17 1905    Clinical Impression Statement Patient met 4 LTGs checked today. Remaining 5 LTGs to be checked next session with anticipated discharge. Patient had to cancel other appt this week due to caregiver issue & no transportation. Appointment rescheduled on Mon, 8/20.    Rehab Potential Good   Clinical Impairments Affecting Rehab Potential anxiety, depression, hypothyroidism, R rib fracture (April 2017) due to fall, pneumothorax (April 2017) due to a fall, history of cecal cancer, venous insufficiency, osteopenia, DJD  in cervical spine     PT Frequency 2x / week   PT Duration 8 weeks   PT Treatment/Interventions ADLs/Self Care Home Management;Neuromuscular re-education;Balance training;Therapeutic exercise;Therapeutic activities;Functional mobility training;Stair training;Gait training;DME Instruction;Patient/family education;Energy conservation   PT Next Visit Plan Gait with rollator, postural exercises, balance exercises, rollator management   Consulted and Agree with Plan of Care Family member/caregiver;Patient   Family  Member Consulted caregiver - Kim       Patient will benefit from skilled therapeutic intervention in order to improve the following deficits and impairments:  Abnormal gait, Decreased activity tolerance, Decreased balance, Decreased coordination, Decreased safety awareness, Decreased range of motion, Decreased mobility, Decreased knowledge of use of DME, Decreased endurance, Decreased strength, Difficulty walking, Dizziness, Cardiopulmonary status limiting activity  Visit Diagnosis: Muscle weakness (generalized)  Other abnormalities of gait and mobility  Unsteadiness on feet  Dizziness and giddiness     Problem List Patient Active Problem List   Diagnosis Date Noted  . Fall 11/18/2015  . Multiple fractures of ribs of right side 11/18/2015  . Traumatic pneumothorax 11/18/2015  . Muscle spasms of neck 03/23/2015  . Acute encephalopathy 03/22/2015  . UTI (lower urinary tract infection) 03/22/2015  . Anxiety 03/22/2015  . Hyponatremia 03/22/2015  . Gait abnormality 05/11/2012  . Weakness 05/11/2012    Domonic Hiscox PT, DPT 03/17/2017, 9:08 AM  [facility] [address] [place], [place], [address] Phone: [phone]   Fax:  [phone]  Name: RIONNA FELTES MRN: [id number] Date of Birth: Jul 01, 1941

## 2017-03-21 ENCOUNTER — Ambulatory Visit: Payer: Medicare Other | Admitting: Physical Therapy

## 2017-03-21 ENCOUNTER — Encounter: Payer: Self-pay | Admitting: Physical Therapy

## 2017-03-21 DIAGNOSIS — R2689 Other abnormalities of gait and mobility: Secondary | ICD-10-CM

## 2017-03-21 DIAGNOSIS — R2681 Unsteadiness on feet: Secondary | ICD-10-CM | POA: Diagnosis not present

## 2017-03-21 DIAGNOSIS — R42 Dizziness and giddiness: Secondary | ICD-10-CM | POA: Diagnosis not present

## 2017-03-21 DIAGNOSIS — M6281 Muscle weakness (generalized): Secondary | ICD-10-CM | POA: Diagnosis not present

## 2017-03-22 NOTE — Addendum Note (Signed)
Addended by: Isaias Cowman on: 03/22/2017 06:55 AM   Modules accepted: Orders

## 2017-03-22 NOTE — Therapy (Addendum)
[facility] [address] [place], [place], [address] Phone: [phone]   Fax:  [phone]  Physical Therapy Treatment  Patient Details  Name: Mackenzie Blanchard MRN: [id number] Date of Birth: Jun 11, 1941 Referring Provider: Lajean Manes MD   Encounter Date: 03/21/2017      PT End of Session - 03/21/17 1636    Visit Number 13   Number of Visits 17   Date for PT Re-Evaluation 03/20/17   Authorization Type Medicare and G-codes every 10th visit    PT Start Time 1316   PT Stop Time 1400   PT Time Calculation (min) 44 min   Equipment Utilized During Treatment --  rollator   Activity Tolerance Patient tolerated treatment well   Behavior During Therapy [facility] for tasks assessed/performed      Past Medical History:  Diagnosis Date  . Anxiety   . Arthritis    "neck and upper back" (11/18/2015)  . Chronic right shoulder pain    w/limited ROM (11/18/2015)  . Colon cancer ([place])   . Depression   . Edema    idiopathic  . Fall at home 11/18/2015   right rib fractures with pneumothorax  . Hashimoto's disease   . Hyperlipidemia   . Hypothyroidism   . Osteopenia   . Sinusitis, chronic     Past Surgical History:  Procedure Laterality Date  . CATARACT EXTRACTION W/ INTRAOCULAR LENS  IMPLANT, BILATERAL Bilateral    bilaterally  . COLECTOMY  05/2013  . COLONOSCOPY WITH PROPOFOL N/A 11/16/2012   Procedure: COLONOSCOPY WITH PROPOFOL;  Surgeon: Garlan Fair, MD;  Location: [facility];  Service: Endoscopy;  Laterality: N/A;  h&p in file-[facility]  . TONSILLECTOMY    . TUBAL LIGATION      There were no vitals filed for this visit.      Subjective Assessment - 03/21/17 1316    Subjective No falls. She went into yard to put some fertilizer in yard without issues.    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 No/denies            [facility] PT Assessment - 03/21/17 1315      Observation/Other Assessments   Focus on Therapeutic Outcomes (FOTO)  59.92 FS  Initial 28.94 Functional Status   Activities of Balance Confidence Scale (ABC Scale)  40.0%  Initial 9.4%     Ambulation/Gait   Ambulation/Gait Yes   Ambulation/Gait Assistance 5: Supervision   Ambulation Distance (Feet) 950 Feet   Assistive device Rollator   Gait Pattern Step-through pattern;Trunk flexed   Ambulation Surface Indoor;Level   Gait velocity 2.53 ft/sec with rollator walker   Stairs Yes     6 Minute Walk- Baseline   6 Minute Walk- Baseline yes   BP (mmHg) 121/67   HR (bpm) 80   02 Sat (%RA) 95 %     6 Minute walk- Post Test   6 Minute Walk Post Test yes   BP (mmHg) 130/64   HR (bpm) 91   02 Sat (%RA) 95 %   Modified Borg Scale for Dyspnea 2- Mild shortness of breath     6 minute walk test results    Aerobic Endurance Distance Walked  911                             PT Education - 03/21/17 1345    Education provided Yes   Education Details [employer] exercises classes & need for ongoing HEP   Person(s) Educated Patient;Caregiver(s)   Methods Explanation;Verbal cues;Other (comment)  internet info on [employer] classes with recommendation chair Yoga & balance (SAIL)   Comprehension Verbalized understanding          PT Short Term Goals - 02/16/17 1316      PT SHORT TERM GOAL #1   Title Patient will verbalize understanding and return demonstration for initial HEP to increase LE strength and balance to reduce risk of falling. (TARGET DATE: 02/18/2017)    Baseline Partially MET 02/14/2017 Pt verbalizes & demo HEP instructed but limited due to not scheduling PT for over 2 weeks after  evaluation.    Time 4   Period Weeks   Status Partially Met     PT SHORT TERM GOAL #2   Title Patient's gait speed will be >/= 1.15f/s to indicate a decresae in risk of falling. (TARGET DATE: 02/18/2017)    Time 4   Period Weeks   Status Achieved     PT SHORT TERM GOAL #3   Title Patient's cognitive TUG score will be </=20% greater than standard TUG to indicate a decrease in risk of falling while completing attention demanding tasks. (TARGET DATE: 02/18/2017)    Baseline normal: 30.2 sec.  and cognitive: 31.2 sec.   Time 4   Period Weeks   Status Not Met     PT SHORT TERM GOAL #4   Title PT will perform 6 minute walk test, and patient will score >/= 741fmore than her baseline measurement to indicate improvement in her endurance. (TARGET DATE: 02/18/2017)    Baseline 579'   Time 4   Period Weeks   Status Not Met     PT SHORT TERM GOAL #5   Title Patient will demonstrate ability to ambulate on stairs (2 rails), ramp/curb with min guard to indicate a decrease in risk of falling. (TARGET DATE: 02/18/2017)    Baseline MET 02/14/2017   Time 4   Period Weeks   Status Achieved     PT SHORT TERM GOAL #6   Title Patient will demonstrate ability to ambulate 25082fith RW with supervision to indicate a decrease in risk of falling. (TARGET DATE: 02/18/2017)    Time 4   Period Weeks   Status Partially Met           PT Long Term Goals - 03/21/17 1639      PT LONG TERM GOAL #1   Title Patient will verbalize understanding and return demonstration for HEP to increase LE strength and balance to decrease risk of falling. (TARGET DATE: 03/18/2017)    Baseline MET 03/21/2017    Time 8   Period Weeks   Status Achieved     PT LONG TERM GOAL #2   Title Patient's gait speed will be >/= 1.8ft27fwith RW to indicate a decresae in her risk of repeated falls. (TARGET DATE: 03/18/2017)    Baseline MET 03/21/2017  Gait Velocity with rollator walker 2.53 ft/sec   Time 8   Period Weeks   Status Achieved      PT LONG TERM GOAL #3   Title Patient's cognitive TUG score will be </= 10% greater than standard  TUG score to indicate a decrease in risk of falling when completing attention demanding tasks. (TARGET DATE: 03/18/2017)    Baseline MET 03/16/2017  No device std TUG 14.12 sec and cog TUG 15.53sec (9.9% increase)   Time 8   Period Weeks   Status Achieved     PT LONG TERM GOAL #4   Title Patient will score >/= 40/56 on the Berg Balance Test to indicate a decrease in risk of falling. (TARGET DATE: 03/18/2017)    Baseline MET 03/16/2017  Merrilee Jansky Balance 44/56   Time 8   Period Weeks   Status Achieved     PT LONG TERM GOAL #5   Title Patient will improve her ABC (Activities of Balance Confidence) FOTO score by >/=10% to indicate improvement in self-perceived functional mobility. (TARGET DATE: 03/18/2017)    Baseline MET 03/21/2017  ABC 40.0% from 9.4%   Time 8   Period Weeks   Status Achieved     PT LONG TERM GOAL #6   Title Patient will demonstrate ability to ambulate on stairs (1 rail), ramp, curb with supervision and LRAD to indicate a decrease in risk of falling when returning to full community ambulation. (TARGET DATE: 03/18/2017)    Baseline MET 03/16/2017 using rollator walker on ramps & curbs   Status Achieved     PT LONG TERM GOAL #7   Title Patient will be able to ambulate 573f including outdoor surfaces with LRAD and supervision to indicate a decrease in risk of falling when ambulating in the yard and community. (TARGET DATE: 03/18/2017)    Baseline MET 03/16/2017   Time 8   Period Weeks   Status Achieved     PT LONG TERM GOAL #8   Title Patient & caregiver report ability to lift/carry objects (simulating lawn care materials) on outdoor (grass/gravel) surfaces using rollator for 233fwith supervision to indicate a decrease in risk of falling when performing lawn care work. (TARGET DATE: 03/18/2017)    Baseline MET 03/21/2017 Pt & CNA report performing yard work over weekend without  issues.    Time 8   Period Weeks   Status Achieved     PT LONG TERM GOAL  #9   TITLE PT will perform 6 minute walk test, and patient will improve by >/=1506from baseline measurement with stable vital signs to indicate improvment in endurance. (TARGET DATE: 03/18/2017)    Baseline MET 03/21/2017 with rollator walker 911' from 579'   [facility] [place]08/20/18 1644    Clinical Impression Statement Patient met all LTGs. She has improved community mobility and walking in her yard with a rollator walker. She should continue with HEP & [facility] classes [facility] [employer] [place] [employer].    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    Consulted and Agree with Plan  of Care Family member/caregiver;Patient   Family Member Consulted caregiver - Kim       Patient will benefit from skilled therapeutic intervention in order to improve the following deficits and impairments:  Abnormal gait, Decreased activity tolerance, Decreased balance, Decreased coordination, Decreased safety awareness, Decreased range of motion, Decreased mobility, Decreased knowledge of use of DME, Decreased endurance, Decreased strength, Difficulty  walking, Dizziness, Cardiopulmonary status limiting activity  Visit Diagnosis: Muscle weakness (generalized)  Other abnormalities of gait and mobility  Unsteadiness on feet       G-Codes - 04-16-17 1648    Functional Assessment Tool Used (Outpatient Only) Timed Up & Go without device 14.12sec and cognitive TUG 15.53sec   Functional Limitation Mobility: Walking and moving around   Mobility: Walking and Moving Around Goal Status [phone]) At least 40 percent but less than 60 percent impaired, limited or restricted   Mobility: Walking and Moving Around Discharge Status [phone]) At least 40 percent but less than 60 percent impaired, limited or restricted      Problem List Patient Active Problem List   Diagnosis Date Noted  . Fall 11/18/2015  . Multiple fractures of ribs of right side 11/18/2015  . Traumatic pneumothorax 11/18/2015  . Muscle spasms of neck 03/23/2015  . Acute encephalopathy 04/17/15  . UTI (lower urinary tract infection) 17-Apr-2015  . Anxiety 04-17-2015  . Hyponatremia 04/17/2015  . Gait abnormality 05/11/2012  . Weakness 05/11/2012    PHYSICAL THERAPY DISCHARGE SUMMARY  Visits from Start of Care: 13  Current functional level related to goals / functional outcomes: See above   Remaining deficits: See above   Education / Equipment: HEP & community based exercise classes for seniors.  Patient purchased a rollator walker.  Plan: Patient agrees to discharge.  Patient goals were met. Patient is being discharged due to meeting the stated rehab goals.  ?????         Erique Kaser PT, DPT 03/22/2017, 6:53 AM  [facility] [address] [place], [place], [address] Phone: [phone]   Fax:  [phone]  Name: Mackenzie Blanchard MRN: [id number] Date of Birth: [birth year]

## 2017-03-23 ENCOUNTER — Ambulatory Visit: Payer: Medicare Other | Admitting: Physical Therapy

## 2017-03-28 ENCOUNTER — Ambulatory Visit: Payer: Medicare Other | Admitting: Physical Therapy

## 2017-03-30 ENCOUNTER — Ambulatory Visit: Payer: Medicare Other | Admitting: Physical Therapy

## 2017-04-05 ENCOUNTER — Ambulatory Visit: Payer: Medicare Other | Admitting: Physical Therapy

## 2017-04-06 ENCOUNTER — Ambulatory Visit: Payer: Medicare Other | Admitting: Physical Therapy

## 2017-04-06 DIAGNOSIS — M7121 Synovial cyst of popliteal space [Baker], right knee: Secondary | ICD-10-CM | POA: Diagnosis not present

## 2017-04-28 DIAGNOSIS — M1711 Unilateral primary osteoarthritis, right knee: Secondary | ICD-10-CM | POA: Diagnosis not present

## 2017-04-28 DIAGNOSIS — M7121 Synovial cyst of popliteal space [Baker], right knee: Secondary | ICD-10-CM | POA: Diagnosis not present

## 2017-04-28 DIAGNOSIS — M25561 Pain in right knee: Secondary | ICD-10-CM | POA: Diagnosis not present

## 2017-06-06 DIAGNOSIS — E039 Hypothyroidism, unspecified: Secondary | ICD-10-CM | POA: Diagnosis not present

## 2017-06-06 DIAGNOSIS — F325 Major depressive disorder, single episode, in full remission: Secondary | ICD-10-CM | POA: Diagnosis not present

## 2017-06-06 DIAGNOSIS — Z0000 Encounter for general adult medical examination without abnormal findings: Secondary | ICD-10-CM | POA: Diagnosis not present

## 2017-06-06 DIAGNOSIS — Z79899 Other long term (current) drug therapy: Secondary | ICD-10-CM | POA: Diagnosis not present

## 2017-08-19 DIAGNOSIS — F3342 Major depressive disorder, recurrent, in full remission: Secondary | ICD-10-CM | POA: Diagnosis not present

## 2017-09-13 DIAGNOSIS — H43813 Vitreous degeneration, bilateral: Secondary | ICD-10-CM | POA: Diagnosis not present

## 2017-09-13 DIAGNOSIS — H353132 Nonexudative age-related macular degeneration, bilateral, intermediate dry stage: Secondary | ICD-10-CM | POA: Diagnosis not present

## 2017-12-12 DIAGNOSIS — E039 Hypothyroidism, unspecified: Secondary | ICD-10-CM | POA: Diagnosis not present

## 2017-12-12 DIAGNOSIS — R634 Abnormal weight loss: Secondary | ICD-10-CM | POA: Diagnosis not present

## 2018-01-04 DIAGNOSIS — R197 Diarrhea, unspecified: Secondary | ICD-10-CM | POA: Diagnosis not present

## 2018-02-23 DIAGNOSIS — H353132 Nonexudative age-related macular degeneration, bilateral, intermediate dry stage: Secondary | ICD-10-CM | POA: Diagnosis not present

## 2018-02-23 DIAGNOSIS — H43813 Vitreous degeneration, bilateral: Secondary | ICD-10-CM | POA: Diagnosis not present

## 2018-03-14 DIAGNOSIS — F3342 Major depressive disorder, recurrent, in full remission: Secondary | ICD-10-CM | POA: Diagnosis not present

## 2018-04-24 DIAGNOSIS — F3342 Major depressive disorder, recurrent, in full remission: Secondary | ICD-10-CM | POA: Diagnosis not present

## 2018-05-23 DIAGNOSIS — H524 Presbyopia: Secondary | ICD-10-CM | POA: Diagnosis not present

## 2018-06-13 DIAGNOSIS — Z0000 Encounter for general adult medical examination without abnormal findings: Secondary | ICD-10-CM | POA: Diagnosis not present

## 2018-06-13 DIAGNOSIS — F419 Anxiety disorder, unspecified: Secondary | ICD-10-CM | POA: Diagnosis not present

## 2018-06-13 DIAGNOSIS — Z79899 Other long term (current) drug therapy: Secondary | ICD-10-CM | POA: Diagnosis not present

## 2018-06-13 DIAGNOSIS — E039 Hypothyroidism, unspecified: Secondary | ICD-10-CM | POA: Diagnosis not present

## 2018-06-22 DIAGNOSIS — R197 Diarrhea, unspecified: Secondary | ICD-10-CM | POA: Diagnosis not present

## 2018-06-23 DIAGNOSIS — R197 Diarrhea, unspecified: Secondary | ICD-10-CM | POA: Diagnosis not present

## 2018-07-12 DIAGNOSIS — Z961 Presence of intraocular lens: Secondary | ICD-10-CM | POA: Diagnosis not present

## 2018-07-12 DIAGNOSIS — H35363 Drusen (degenerative) of macula, bilateral: Secondary | ICD-10-CM | POA: Diagnosis not present

## 2018-07-12 DIAGNOSIS — H35723 Serous detachment of retinal pigment epithelium, bilateral: Secondary | ICD-10-CM | POA: Diagnosis not present

## 2018-07-12 DIAGNOSIS — H442D3 Degenerative myopia with foveoschisis, bilateral eye: Secondary | ICD-10-CM | POA: Diagnosis not present

## 2018-07-13 DIAGNOSIS — R2689 Other abnormalities of gait and mobility: Secondary | ICD-10-CM | POA: Diagnosis not present

## 2018-07-13 DIAGNOSIS — M6281 Muscle weakness (generalized): Secondary | ICD-10-CM | POA: Diagnosis not present

## 2018-07-13 DIAGNOSIS — R42 Dizziness and giddiness: Secondary | ICD-10-CM | POA: Diagnosis not present

## 2018-07-13 DIAGNOSIS — R2681 Unsteadiness on feet: Secondary | ICD-10-CM | POA: Diagnosis not present

## 2018-07-17 DIAGNOSIS — F3342 Major depressive disorder, recurrent, in full remission: Secondary | ICD-10-CM | POA: Diagnosis not present

## 2018-07-20 DIAGNOSIS — R2689 Other abnormalities of gait and mobility: Secondary | ICD-10-CM | POA: Diagnosis not present

## 2018-07-20 DIAGNOSIS — R2681 Unsteadiness on feet: Secondary | ICD-10-CM | POA: Diagnosis not present

## 2018-07-20 DIAGNOSIS — R42 Dizziness and giddiness: Secondary | ICD-10-CM | POA: Diagnosis not present

## 2018-07-20 DIAGNOSIS — M6281 Muscle weakness (generalized): Secondary | ICD-10-CM | POA: Diagnosis not present

## 2018-07-27 DIAGNOSIS — R2681 Unsteadiness on feet: Secondary | ICD-10-CM | POA: Diagnosis not present

## 2018-07-27 DIAGNOSIS — R42 Dizziness and giddiness: Secondary | ICD-10-CM | POA: Diagnosis not present

## 2018-07-27 DIAGNOSIS — M6281 Muscle weakness (generalized): Secondary | ICD-10-CM | POA: Diagnosis not present

## 2018-07-27 DIAGNOSIS — R2689 Other abnormalities of gait and mobility: Secondary | ICD-10-CM | POA: Diagnosis not present

## 2018-08-03 DIAGNOSIS — R2689 Other abnormalities of gait and mobility: Secondary | ICD-10-CM | POA: Diagnosis not present

## 2018-08-03 DIAGNOSIS — R42 Dizziness and giddiness: Secondary | ICD-10-CM | POA: Diagnosis not present

## 2018-08-03 DIAGNOSIS — M6281 Muscle weakness (generalized): Secondary | ICD-10-CM | POA: Diagnosis not present

## 2018-08-03 DIAGNOSIS — R2681 Unsteadiness on feet: Secondary | ICD-10-CM | POA: Diagnosis not present

## 2018-08-07 ENCOUNTER — Other Ambulatory Visit: Payer: Self-pay

## 2018-08-07 ENCOUNTER — Emergency Department (HOSPITAL_COMMUNITY): Payer: Medicare Other

## 2018-08-07 ENCOUNTER — Inpatient Hospital Stay (HOSPITAL_COMMUNITY)
Admission: EM | Admit: 2018-08-07 | Discharge: 2018-08-11 | DRG: 481 | Disposition: A | Discharge status: Skilled Nursing Facility | Payer: Medicare Other | Attending: Internal Medicine | Admitting: Internal Medicine

## 2018-08-07 ENCOUNTER — Encounter (HOSPITAL_COMMUNITY): Payer: Self-pay

## 2018-08-07 DIAGNOSIS — E871 Hypo-osmolality and hyponatremia: Secondary | ICD-10-CM | POA: Diagnosis not present

## 2018-08-07 DIAGNOSIS — S72141D Displaced intertrochanteric fracture of right femur, subsequent encounter for closed fracture with routine healing: Secondary | ICD-10-CM | POA: Diagnosis not present

## 2018-08-07 DIAGNOSIS — R296 Repeated falls: Secondary | ICD-10-CM | POA: Diagnosis present

## 2018-08-07 DIAGNOSIS — R262 Difficulty in walking, not elsewhere classified: Secondary | ICD-10-CM | POA: Diagnosis not present

## 2018-08-07 DIAGNOSIS — E785 Hyperlipidemia, unspecified: Secondary | ICD-10-CM | POA: Diagnosis present

## 2018-08-07 DIAGNOSIS — R9431 Abnormal electrocardiogram [ECG] [EKG]: Secondary | ICD-10-CM | POA: Diagnosis present

## 2018-08-07 DIAGNOSIS — F411 Generalized anxiety disorder: Secondary | ICD-10-CM | POA: Diagnosis present

## 2018-08-07 DIAGNOSIS — Z801 Family history of malignant neoplasm of trachea, bronchus and lung: Secondary | ICD-10-CM

## 2018-08-07 DIAGNOSIS — Z9049 Acquired absence of other specified parts of digestive tract: Secondary | ICD-10-CM

## 2018-08-07 DIAGNOSIS — Z9181 History of falling: Secondary | ICD-10-CM | POA: Diagnosis not present

## 2018-08-07 DIAGNOSIS — Z85038 Personal history of other malignant neoplasm of large intestine: Secondary | ICD-10-CM | POA: Diagnosis not present

## 2018-08-07 DIAGNOSIS — S72001D Fracture of unspecified part of neck of right femur, subsequent encounter for closed fracture with routine healing: Secondary | ICD-10-CM | POA: Diagnosis not present

## 2018-08-07 DIAGNOSIS — M6281 Muscle weakness (generalized): Secondary | ICD-10-CM | POA: Diagnosis not present

## 2018-08-07 DIAGNOSIS — R41841 Cognitive communication deficit: Secondary | ICD-10-CM | POA: Diagnosis not present

## 2018-08-07 DIAGNOSIS — Z79890 Hormone replacement therapy (postmenopausal): Secondary | ICD-10-CM | POA: Diagnosis not present

## 2018-08-07 DIAGNOSIS — S299XXA Unspecified injury of thorax, initial encounter: Secondary | ICD-10-CM | POA: Diagnosis not present

## 2018-08-07 DIAGNOSIS — S72141A Displaced intertrochanteric fracture of right femur, initial encounter for closed fracture: Principal | ICD-10-CM | POA: Diagnosis present

## 2018-08-07 DIAGNOSIS — Z419 Encounter for procedure for purposes other than remedying health state, unspecified: Secondary | ICD-10-CM | POA: Diagnosis not present

## 2018-08-07 DIAGNOSIS — E063 Autoimmune thyroiditis: Secondary | ICD-10-CM | POA: Diagnosis present

## 2018-08-07 DIAGNOSIS — E440 Moderate protein-calorie malnutrition: Secondary | ICD-10-CM | POA: Diagnosis not present

## 2018-08-07 DIAGNOSIS — Z8249 Family history of ischemic heart disease and other diseases of the circulatory system: Secondary | ICD-10-CM

## 2018-08-07 DIAGNOSIS — R339 Retention of urine, unspecified: Secondary | ICD-10-CM | POA: Diagnosis not present

## 2018-08-07 DIAGNOSIS — S72001A Fracture of unspecified part of neck of right femur, initial encounter for closed fracture: Secondary | ICD-10-CM | POA: Diagnosis present

## 2018-08-07 DIAGNOSIS — M1380 Other specified arthritis, unspecified site: Secondary | ICD-10-CM | POA: Diagnosis not present

## 2018-08-07 DIAGNOSIS — R52 Pain, unspecified: Secondary | ICD-10-CM | POA: Diagnosis not present

## 2018-08-07 DIAGNOSIS — Z79899 Other long term (current) drug therapy: Secondary | ICD-10-CM | POA: Diagnosis not present

## 2018-08-07 DIAGNOSIS — W010XXA Fall on same level from slipping, tripping and stumbling without subsequent striking against object, initial encounter: Secondary | ICD-10-CM | POA: Diagnosis present

## 2018-08-07 DIAGNOSIS — Y92012 Bathroom of single-family (private) house as the place of occurrence of the external cause: Secondary | ICD-10-CM

## 2018-08-07 DIAGNOSIS — D62 Acute posthemorrhagic anemia: Secondary | ICD-10-CM | POA: Diagnosis not present

## 2018-08-07 DIAGNOSIS — M25572 Pain in left ankle and joints of left foot: Secondary | ICD-10-CM | POA: Diagnosis not present

## 2018-08-07 DIAGNOSIS — D509 Iron deficiency anemia, unspecified: Secondary | ICD-10-CM | POA: Diagnosis not present

## 2018-08-07 DIAGNOSIS — F329 Major depressive disorder, single episode, unspecified: Secondary | ICD-10-CM | POA: Diagnosis not present

## 2018-08-07 DIAGNOSIS — Z111 Encounter for screening for respiratory tuberculosis: Secondary | ICD-10-CM | POA: Diagnosis not present

## 2018-08-07 DIAGNOSIS — M62838 Other muscle spasm: Secondary | ICD-10-CM | POA: Diagnosis not present

## 2018-08-07 DIAGNOSIS — E039 Hypothyroidism, unspecified: Secondary | ICD-10-CM | POA: Diagnosis not present

## 2018-08-07 DIAGNOSIS — M25551 Pain in right hip: Secondary | ICD-10-CM | POA: Diagnosis not present

## 2018-08-07 DIAGNOSIS — F419 Anxiety disorder, unspecified: Secondary | ICD-10-CM | POA: Diagnosis present

## 2018-08-07 DIAGNOSIS — I10 Essential (primary) hypertension: Secondary | ICD-10-CM | POA: Diagnosis not present

## 2018-08-07 DIAGNOSIS — S0990XA Unspecified injury of head, initial encounter: Secondary | ICD-10-CM | POA: Diagnosis not present

## 2018-08-07 DIAGNOSIS — K5900 Constipation, unspecified: Secondary | ICD-10-CM | POA: Diagnosis not present

## 2018-08-07 LAB — CBC
HCT: 37.9 % (ref 36.0–46.0)
Hemoglobin: 12.2 g/dL (ref 12.0–15.0)
MCH: 30.9 pg (ref 26.0–34.0)
MCHC: 32.2 g/dL (ref 30.0–36.0)
MCV: 95.9 fL (ref 80.0–100.0)
Platelets: 252 10*3/uL (ref 150–400)
RBC: 3.95 MIL/uL (ref 3.87–5.11)
RDW: 12.4 % (ref 11.5–15.5)
WBC: 8 10*3/uL (ref 4.0–10.5)
nRBC: 0 % (ref 0.0–0.2)

## 2018-08-07 LAB — COMPREHENSIVE METABOLIC PANEL
ALK PHOS: 41 U/L (ref 38–126)
ALT: 15 U/L (ref 0–44)
AST: 15 U/L (ref 15–41)
Albumin: 3.8 g/dL (ref 3.5–5.0)
Anion gap: 10 (ref 5–15)
BUN: 14 mg/dL (ref 8–23)
CALCIUM: 8.7 mg/dL — AB (ref 8.9–10.3)
CO2: 24 mmol/L (ref 22–32)
Chloride: 102 mmol/L (ref 98–111)
Creatinine, Ser: 0.48 mg/dL (ref 0.44–1.00)
GFR calc Af Amer: >60 mL/min (ref >60)
GFR calc non Af Amer: >60 mL/min (ref >60)
Glucose, Bld: 105 mg/dL — ABNORMAL HIGH (ref 70–99)
Potassium: 3.6 mmol/L (ref 3.5–5.1)
Sodium: 136 mmol/L (ref 135–145)
Total Bilirubin: 0.4 mg/dL (ref 0.3–1.2)
Total Protein: 5.9 g/dL — ABNORMAL LOW (ref 6.5–8.1)

## 2018-08-07 MED ORDER — MORPHINE SULFATE (PF) 4 MG/ML IV SOLN: 4.0000 mg | Freq: Once | INTRAVENOUS | Status: DC

## 2018-08-07 MED ORDER — MIRTAZAPINE 30 MG PO TBDP: 45.0000 mg | ORAL_TABLET | Freq: Every day | ORAL | Status: DC

## 2018-08-07 MED ORDER — MELATONIN 5 MG PO CAPS: 5.0000 mg | ORAL_CAPSULE | Freq: Every day | ORAL | Status: DC

## 2018-08-07 MED ORDER — MIRTAZAPINE 15 MG PO TABS
45.0000 mg | ORAL_TABLET | Freq: Every day | ORAL | Status: DC
  Administered 2018-08-07 – 2018-08-08 (×2): 45 mg via ORAL
  Filled 2018-08-07: qty 1
  Filled 2018-08-07: qty 3

## 2018-08-07 MED ORDER — MORPHINE SULFATE (PF) 4 MG/ML IV SOLN
4.0000 mg | Freq: Once | INTRAVENOUS | Status: AC
  Administered 2018-08-08: 4 mg via INTRAVENOUS
  Filled 2018-08-07: qty 1

## 2018-08-07 MED ORDER — MELATONIN 5 MG PO TABS
5.0000 mg | ORAL_TABLET | Freq: Every day | ORAL | Status: DC
  Administered 2018-08-07 – 2018-08-10 (×4): 5 mg via ORAL
  Filled 2018-08-07 (×4): qty 1

## 2018-08-07 MED ORDER — MELATONIN 5 MG PO CAPS: 15.0000 mg | ORAL_CAPSULE | Freq: Every day | ORAL | Status: DC

## 2018-08-07 MED ORDER — TRAZODONE HCL 50 MG PO TABS
75.0000 mg | ORAL_TABLET | Freq: Every day | ORAL | Status: DC
  Administered 2018-08-07 – 2018-08-08 (×2): 75 mg via ORAL
  Filled 2018-08-07 (×2): qty 2

## 2018-08-07 MED ORDER — MORPHINE SULFATE (PF) 4 MG/ML IV SOLN
3.0000 mg | Freq: Once | INTRAVENOUS | Status: AC
  Administered 2018-08-07: 3 mg via INTRAVENOUS
  Filled 2018-08-07: qty 1

## 2018-08-07 NOTE — Progress Notes (Signed)
S/p mechanical fall with closed displaced R IT femur fx. Will plan ORIF tomorrow approx 5pm with hip fracture nail. Will eval in the am, and obtain consent then. Please keep at bedrest, NPO after midnight with SCDs and no pharmacologic VTE prophylaxis.  Micheline Rough, MD [facility] [phone]

## 2018-08-07 NOTE — ED Triage Notes (Signed)
Pt arrived via EMS. Pt had a fall in the bathroom today pt denies LOC, pt lost her balance. Pt is not on blood thinners.  Pt is c/o rt hip pain 9/10. Pt was given 100 mcg of fentanyl en route.    BP140/78, HR 90, RR 18,

## 2018-08-07 NOTE — ED Provider Notes (Signed)
[facility] DEPT Provider Note   CSN: [id number] Arrival date & time: 08/07/18  2050     History   Chief Complaint Chief Complaint  Patient presents with  . Fall    HPI Mackenzie Blanchard is a 78 y.o. female.  78yo F w/ PMH including colon cancer, anxiety, hypothyroidism, HLD who p/w fall. Pt has ongoing balance problems and frequent falls per caregiver. This evening when she was in the bathroom, pt lost her balance trying to turn on a light and fell onto her right side.  She is not sure whether she hit her head but states she did not lose consciousness.  Since the fall she has had severe, constant pain in her right hip worse with any movement.  She denies any head, neck, back, or chest pain.  She received fentanyl by EMS, currently states that her pain is severe.  No anticoagulant use.  Caregiver and daughter note that she has chronic problems with anxiety.  The history is provided by the patient and a relative.  Fall     Past Medical History:  Diagnosis Date  . Anxiety   . Arthritis    "neck and upper back" (11/18/2015)  . Chronic right shoulder pain    w/limited ROM (11/18/2015)  . Colon cancer ([place])   . Depression   . Edema    idiopathic  . Fall at home 11/18/2015   right rib fractures with pneumothorax  . Hashimoto's disease   . Hyperlipidemia   . Hypothyroidism   . Osteopenia   . Sinusitis, chronic     Patient Active Problem List   Diagnosis Date Noted  . Fall 11/18/2015  . Multiple fractures of ribs of right side 11/18/2015  . Traumatic pneumothorax 11/18/2015  . Muscle spasms of neck 03/23/2015  . Acute encephalopathy 03/22/2015  . UTI (lower urinary tract infection) 03/22/2015  . Anxiety 03/22/2015  . Hyponatremia 03/22/2015  . Gait abnormality 05/11/2012  . Weakness 05/11/2012    Past Surgical History:  Procedure Laterality Date  . CATARACT EXTRACTION W/ INTRAOCULAR LENS  IMPLANT, BILATERAL Bilateral    bilaterally  .  COLECTOMY  05/2013  . COLONOSCOPY WITH PROPOFOL N/A 11/16/2012   Procedure: COLONOSCOPY WITH PROPOFOL;  Surgeon: Garlan Fair, MD;  Location: [facility];  Service: Endoscopy;  Laterality: N/A;  h&p in file-[facility]  . TONSILLECTOMY    . TUBAL LIGATION       OB History   No obstetric history on file.      Home Medications    Prior to Admission medications   Medication Sig Start Date End Date Taking? Authorizing Provider  acetaminophen (TYLENOL) 325 MG tablet Take 650 mg by mouth at bedtime.    Yes [provider]  cholecalciferol (VITAMIN D) 1000 units tablet Take 1,000 Units by mouth 2 (two) times daily.    Yes [provider]  Coenzyme Q10 (CO Q10) 100 MG CAPS Take 400 mg by mouth daily.    Yes [provider]  diphenhydrAMINE (BENADRYL) 25 MG tablet Take 25 mg by mouth at bedtime as needed for itching or sleep.   Yes [provider]  levothyroxine (SYNTHROID, LEVOTHROID) 88 MCG tablet Take 88 mcg by mouth daily.    Yes [provider]  Lutein 20 MG CAPS Take 40 mg by mouth daily.    Yes [provider]  Melatonin 5 MG CAPS Take 15 mg by mouth at bedtime.    Yes [provider]  mirtazapine (REMERON SOL-TAB) 45 MG disintegrating tablet Take 45 mg by mouth at bedtime.   Yes [provider]  Multiple Vitamin (MULTIVITAMIN) tablet Take 1 tablet by mouth daily.   Yes [provider]  Potassium 99 MG TABS Take 10 tablets by mouth daily. Taking around 2000mg  daily   Yes [provider]  traZODone (DESYREL) 50 MG tablet Take 75 mg by mouth at bedtime.    Yes [provider]  ALPRAZolam (XANAX) 0.25 MG tablet Take 2 tablets (0.5 mg total) by mouth 2 (two) times daily as needed for anxiety. Patient not taking: Reported on 01/19/2017 11/21/15   Erby Pian, NP  docusate sodium (COLACE) 100 MG capsule Take 1 capsule (100 mg total) by mouth 2 (two) times daily. Patient not taking: Reported on  08/07/2018 11/21/15   Erby Pian, NP  HYDROcodone-acetaminophen (NORCO/VICODIN) 5-325 MG tablet Take 0.5-2 tablets by mouth every 4 (four) hours as needed for moderate pain (1/2 tab for mild pain, 1 tab for moderate pain, 2 tabs for severe pain). Patient not taking: Reported on 08/07/2018 11/21/15   Erby Pian, NP  loperamide (IMODIUM) 2 MG capsule Take 1 capsule (2 mg total) by mouth as needed for diarrhea or loose stools. Patient not taking: Reported on 08/07/2018 03/25/15   Delfina Redwood, MD  polyethylene glycol [facility] / Floria Raveling) packet Take 17 g by mouth 2 (two) times daily. Patient not taking: Reported on 08/07/2018 11/21/15   Erby Pian, NP    Family History Family History  Problem Relation Age of Onset  . Lung cancer Mother   . Hypertension Father   . Heart disease Father   . Cancer - Other Brother   . Diabetes Brother     Social History Social History   Tobacco Use  . Smoking status: Never Smoker  . Smokeless tobacco: Never Used  Substance Use Topics  . Alcohol use: No  . Drug use: No     Allergies   Codeine and Penicillins   Review of Systems Review of Systems All other systems reviewed and are negative except that which was mentioned in HPI   Physical Exam Updated Vital Signs BP (!) 146/89 (BP Location: Right Arm)   Pulse 98   Resp 16   SpO2 96%   Physical Exam Vitals signs and nursing note reviewed.  Constitutional:      General: She is not in acute distress.    Appearance: She is well-developed.     Comments: anxious  HENT:     Head: Normocephalic and atraumatic.     Nose: Nose normal.     Mouth/Throat:     Mouth: Mucous membranes are moist.  Eyes:     Conjunctiva/sclera: Conjunctivae normal.  Neck:     Musculoskeletal: Normal range of motion and neck supple.  Cardiovascular:     Rate and Rhythm: Normal rate and regular rhythm.     Pulses: Normal pulses.     Heart sounds: Normal heart sounds. No murmur.  Pulmonary:     Effort:  Pulmonary effort is normal.     Breath sounds: Normal breath sounds.  Chest:     Chest wall: No tenderness.  Abdominal:     General: Bowel sounds are normal. There is no distension.     Palpations: Abdomen is soft.     Tenderness: There is no abdominal tenderness.  Musculoskeletal:     Comments: R hip in partial flexion, unable to range 2/2 pain; no tenderness at knee or lower  R leg; no tenderness of LLE, BUE  Skin:    General: Skin is warm and dry.  Neurological:     Mental Status: She is alert and oriented to person, place, and time.     Comments: Fluent speech  Psychiatric:        Mood and Affect: Mood is anxious.        Judgment: Judgment normal.      ED Treatments / Results  Labs (all labs ordered are listed, but only abnormal results are displayed) Labs Reviewed  COMPREHENSIVE METABOLIC PANEL - Abnormal; Notable for the following components:      Result Value   Glucose, Bld 105 (*)    Calcium 8.7 (*)    Total Protein 5.9 (*)    All other components within normal limits  CBC    EKG None  Radiology Dg Chest 2 View  Result Date: 08/07/2018 CLINICAL DATA:  Mechanical fall EXAM: CHEST - 2 VIEW COMPARISON:  11/21/2015, 11/17/2015 FINDINGS: No large pleural effusion. No acute airspace disease or pneumothorax. Stable cardiomediastinal silhouette with mildly tortuous aorta. Old right fourth fifth and sixth rib fractures. Age indeterminate mild compression deformities of upper thoracic vertebra. IMPRESSION: 1. Negative for pleural effusion or pneumothorax 2. Age indeterminate mild compression deformities of 2 adjacent upper thoracic vertebra Electronically Signed   By: Donavan Foil M.D.   On: 08/07/2018 21:30   Ct Head Wo Contrast  Result Date: 08/07/2018 CLINICAL DATA:  Head trauma EXAM: CT HEAD WITHOUT CONTRAST TECHNIQUE: Contiguous axial images were obtained from the base of the skull through the vertex without intravenous contrast. COMPARISON:  Head CT 829 2016 FINDINGS:  Brain: No acute territorial infarction, hemorrhage, or intracranial mass. Mild atrophy. Mild small vessel ischemic changes of the white matter. Vascular: No hyperdense vessels. Scattered calcifications at the carotid siphons. Skull: No fracture Sinuses/Orbits: No acute finding. Other: None IMPRESSION: 1. No CT evidence for acute intracranial abnormality. 2. Atrophy and mild small vessel ischemic changes of the white matter Electronically Signed   By: Donavan Foil M.D.   On: 08/07/2018 23:13   Ct Thoracic Spine Wo Contrast  Result Date: 08/07/2018 CLINICAL DATA:  [place] in bathroom EXAM: CT THORACIC SPINE WITHOUT CONTRAST TECHNIQUE: Multidetector CT images of the thoracic were obtained using the standard protocol without intravenous contrast. COMPARISON:  Chest x-ray 08/07/2018 FINDINGS: Alignment: Kyphosis of the spine. No subluxation. Facet alignment within normal limits. Vertebrae: Age indeterminate mild superior endplate deformity at T4. Age indeterminate mild compression at T5 with less than 20% loss of height of the anterior vertebral body. Paraspinal and other soft tissues: No significant paravertebral or paraspinal soft tissue abnormality. Aortic atherosclerosis. 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 Lung fields demonstrate scarring at the apices. Mild bronchiectasis in the right middle lobe. Disc levels: Multiple level degenerative osteophytes anteriorly. Multiple old right posterior rib fractures. IMPRESSION: 1. Age indeterminate mild superior endplate deformity at T4 and mild age indeterminate compression deformity T5 but new since 2017 thoracic radiographs. MRI would be more sensitive for detection of marrow edema and assessing for fracture acuity. Negative for retropulsion or bony canal compromise. Electronically Signed   By: Donavan Foil M.D.   On: 08/07/2018 23:22   Dg Hip Unilat  With Pelvis 2-3 Views Right  Result Date: 08/07/2018 CLINICAL DATA:  Fall with hip pain EXAM: DG HIP (WITH OR WITHOUT  PELVIS) 2-3V RIGHT COMPARISON:  None. FINDINGS: SI joints are non widened.  Pubic symphysis and rami are intact. Acute comminuted  intertrochanteric fracture with medially displaced lesser trochanteric fracture fragment and mild cephalad displacement of the distal femur. Right femoral head projects in joint. IMPRESSION: Acute comminuted and displaced right femoral intertrochanteric fracture Electronically Signed   By: Donavan Foil M.D.   On: 08/07/2018 21:31    Procedures Procedures (including critical care time)  Medications Ordered in ED Medications  traZODone (DESYREL) tablet 75 mg (75 mg Oral Given 08/07/18 2314)  mirtazapine (REMERON) tablet 45 mg (45 mg Oral Given 08/07/18 2314)  Melatonin TABS 5 mg (5 mg Oral Given 08/07/18 2355)  morphine 4 MG/ML injection 3 mg (3 mg Intravenous Given 08/07/18 2230)  morphine 4 MG/ML injection 4 mg (4 mg Intravenous Given 08/08/18 0024)     Initial Impression / Assessment and Plan / ED Course  I have reviewed the triage vital signs and the nursing notes.  Pertinent labs & imaging results that were available during my care of the patient were reviewed by me and considered in my medical decision making (see chart for details).     PT anxious but NAD on exam, stable VS. 2+ DP pulses. XR shows comminuted and displaced right intertrochanteric femur fracture.  Discussed injury with Dr. Grandville Silos, on-call for orthopedics, they will see the patient in consultation.  Chest x-ray shows age-indeterminate compression fractures of upper thoracic spine.  Obtain CT t- spine which redemonstrates age indeterminate injuries of T4-T5. Head CT negative. She has h/o frequent falls and I suspect these are old injuries because she denies any back pain currently. Discussed admission with Dr. Alcario Drought and pt admitted for further care.  Final Clinical Impressions(s) / ED Diagnoses   Final diagnoses:  Closed intertrochanteric fracture of hip, right, initial encounter [facility])    ED  Discharge Orders    None       Eleonore Shippee, Wenda Overland, MD 08/08/18 0025

## 2018-08-07 NOTE — Consult Note (Signed)
ORTHOPAEDIC CONSULTATION HISTORY & PHYSICAL REQUESTING PHYSICIAN: Little, Wenda Overland, MD  Chief Complaint: right hip pain  HPI: Mackenzie Blanchard is a 78 y.o. female who lives alone in a small apartment, ambulates within the apartment mostly without an assistive device, but admits a problem with balance to lost her balance and fell turning around in the bathroom yesterday.  She had immediate onset of pain in the right hip, inability to bear weight and was transported to the hospital via EMS.  She complains of pain about the right hip, and denies previous right-sided hip or knee surgery.  Her daughter is present at the bedside.  Past Medical History:  Diagnosis Date  . Anxiety   . Arthritis    "neck and upper back" (11/18/2015)  . Chronic right shoulder pain    w/limited ROM (11/18/2015)  . Colon cancer ([place])   . Depression   . Edema    idiopathic  . Fall at home 11/18/2015   right rib fractures with pneumothorax  . Hashimoto's disease   . Hyperlipidemia   . Hypothyroidism   . Osteopenia   . Sinusitis, chronic    Past Surgical History:  Procedure Laterality Date  . CATARACT EXTRACTION W/ INTRAOCULAR LENS  IMPLANT, BILATERAL Bilateral    bilaterally  . COLECTOMY  05/2013  . COLONOSCOPY WITH PROPOFOL N/A 11/16/2012   Procedure: COLONOSCOPY WITH PROPOFOL;  Surgeon: Garlan Fair, MD;  Location: [facility];  Service: Endoscopy;  Laterality: N/A;  h&p in file-hope  . TONSILLECTOMY    . TUBAL LIGATION     Social History   Socioeconomic History  . Marital status: Divorced    Spouse name: Not on file  . Number of children: Not on file  . Years of education: Not on file  . Highest education level: Not on file  Occupational History  . Not on file  Social Needs  . Financial resource strain: Not on file  . Food insecurity:    Worry: Not on file    Inability: Not on file  . Transportation needs:    Medical: Not on file    Non-medical: Not on file  Tobacco Use  . Smoking  status: Never Smoker  . Smokeless tobacco: Never Used  Substance and Sexual Activity  . Alcohol use: No  . Drug use: No  . Sexual activity: Never  Lifestyle  . Physical activity:    Days per week: Not on file    Minutes per session: Not on file  . Stress: Not on file  Relationships  . Social connections:    Talks on phone: Not on file    Gets together: Not on file    Attends religious service: Not on file    Active member of club or organization: Not on file    Attends meetings of clubs or organizations: Not on file    Relationship status: Not on file  Other Topics Concern  . Not on file  Social History Narrative  . Not on file   Family History  Problem Relation Age of Onset  . Lung cancer Mother   . Hypertension Father   . Heart disease Father   . Cancer - Other Brother   . Diabetes Brother    Allergies  Allergen Reactions  . Codeine Other (See Comments)    Hallucinations   . Penicillins Rash    DID THE REACTION INVOLVE: Swelling of the face/tongue/throat, SOB, or low BP? Yes Sudden or severe rash/hives, skin peeling, or the  inside of the mouth or nose? No Did it require medical treatment? No When did it last happen?30 yrs ago If all above answers are "NO", may proceed with cephalosporin use.    Prior to Admission medications   Medication Sig Start Date End Date Taking? Authorizing Provider  acetaminophen (TYLENOL) 325 MG tablet Take 650 mg by mouth at bedtime.    Yes [provider]  cholecalciferol (VITAMIN D) 1000 units tablet Take 1,000 Units by mouth 2 (two) times daily.    Yes [provider]  Coenzyme Q10 (CO Q10) 100 MG CAPS Take 400 mg by mouth daily.    Yes [provider]  diphenhydrAMINE (BENADRYL) 25 MG tablet Take 25 mg by mouth at bedtime as needed for itching or sleep.   Yes [provider]  levothyroxine (SYNTHROID, LEVOTHROID) 88 MCG tablet Take 88 mcg by mouth daily.    Yes [provider]    Lutein 20 MG CAPS Take 40 mg by mouth daily.    Yes [provider]  Melatonin 5 MG CAPS Take 15 mg by mouth at bedtime.    Yes [provider]  mirtazapine (REMERON SOL-TAB) 45 MG disintegrating tablet Take 45 mg by mouth at bedtime.   Yes [provider]  Multiple Vitamin (MULTIVITAMIN) tablet Take 1 tablet by mouth daily.   Yes [provider]  Potassium 99 MG TABS Take 10 tablets by mouth daily. Taking around 2000mg  daily   Yes [provider]  traZODone (DESYREL) 50 MG tablet Take 75 mg by mouth at bedtime.    Yes [provider]  ALPRAZolam (XANAX) 0.25 MG tablet Take 2 tablets (0.5 mg total) by mouth 2 (two) times daily as needed for anxiety. Patient not taking: Reported on 01/19/2017 11/21/15   Erby Pian, NP  docusate sodium (COLACE) 100 MG capsule Take 1 capsule (100 mg total) by mouth 2 (two) times daily. Patient not taking: Reported on 08/07/2018 11/21/15   Erby Pian, NP  HYDROcodone-acetaminophen (NORCO/VICODIN) 5-325 MG tablet Take 0.5-2 tablets by mouth every 4 (four) hours as needed for moderate pain (1/2 tab for mild pain, 1 tab for moderate pain, 2 tabs for severe pain). Patient not taking: Reported on 08/07/2018 11/21/15   Erby Pian, NP  loperamide (IMODIUM) 2 MG capsule Take 1 capsule (2 mg total) by mouth as needed for diarrhea or loose stools. Patient not taking: Reported on 08/07/2018 03/25/15   Delfina Redwood, MD  polyethylene glycol [facility] / Floria Raveling) packet Take 17 g by mouth 2 (two) times daily. Patient not taking: Reported on 08/07/2018 11/21/15   Erby Pian, NP   Dg Chest 2 View  Result Date: 08/07/2018 CLINICAL DATA:  Mechanical fall EXAM: CHEST - 2 VIEW COMPARISON:  11/21/2015, 11/17/2015 FINDINGS: No large pleural effusion. No acute airspace disease or pneumothorax. Stable cardiomediastinal silhouette with mildly tortuous aorta. Old right fourth fifth and sixth rib fractures. Age indeterminate mild  compression deformities of upper thoracic vertebra. IMPRESSION: 1. Negative for pleural effusion or pneumothorax 2. Age indeterminate mild compression deformities of 2 adjacent upper thoracic vertebra Electronically Signed   By: Donavan Foil M.D.   On: 08/07/2018 21:30   Dg Hip Unilat  With Pelvis 2-3 Views Right  Result Date: 08/07/2018 CLINICAL DATA:  Fall with hip pain EXAM: DG HIP (WITH OR WITHOUT PELVIS) 2-3V RIGHT COMPARISON:  None. FINDINGS: SI joints are non widened.  Pubic symphysis and rami are intact. Acute comminuted intertrochanteric fracture with medially displaced lesser trochanteric  fracture fragment and mild cephalad displacement of the distal femur. Right femoral head projects in joint. IMPRESSION: Acute comminuted and displaced right femoral intertrochanteric fracture Electronically Signed   By: Donavan Foil M.D.   On: 08/07/2018 21:31    Positive ROS: All other systems have been reviewed and were otherwise negative with the exception of those mentioned in the HPI and as above.  Physical Exam: Vitals: Refer to EMR. Constitutional:  WD, WN, NAD HEENT:  NCAT, EOMI Neuro/Psych:  Alert & oriented to person, place, and time; appropriate mood & affect Lymphatic: No generalized extremity edema or lymphadenopathy Extremities / MSK:  The extremities are normal with respect to appearance, ranges of motion, joint stability, muscle strength/tone, sensation, & perfusion except as otherwise noted:  Lying supine, right leg slightly shorter compared to the left.  Intact light touch sensibility on the plantar and dorsal aspects of the foot, including the first webspace.  Intact plantar and dorsiflexion of the toes and ankle.  Dorsalis pedis faintly palpable.  Pain with passive movement at the hip.  Assessment: Displaced right IT femur fx  Plan: To OR 08-08-18 in late afternoon for ORIF with hip fracture nail.  G/R/O reviewed and consent obtained.  Rayvon Char [address], [place], [place]  [address] Office: [phone] Mobile: [phone]  08/07/2018, 10:42 PM

## 2018-08-07 NOTE — ED Notes (Signed)
Bed: WA04 Expected date:  Expected time:  Means of arrival:  Comments: EMS 78 yo female fall/no blood thinners-pain right hip-no LOC/Iv established Fentanyl IV

## 2018-08-08 ENCOUNTER — Inpatient Hospital Stay (HOSPITAL_COMMUNITY): Payer: Medicare Other

## 2018-08-08 ENCOUNTER — Inpatient Hospital Stay (HOSPITAL_COMMUNITY): Payer: Medicare Other | Admitting: Certified Registered Nurse Anesthetist

## 2018-08-08 ENCOUNTER — Encounter (HOSPITAL_COMMUNITY): Payer: Self-pay | Admitting: *Deleted

## 2018-08-08 ENCOUNTER — Encounter (HOSPITAL_COMMUNITY)
Admission: EM | Disposition: A | Discharge status: Skilled Nursing Facility | Payer: Self-pay | Source: Home / Self Care | Attending: Internal Medicine

## 2018-08-08 DIAGNOSIS — S72001A Fracture of unspecified part of neck of right femur, initial encounter for closed fracture: Secondary | ICD-10-CM | POA: Diagnosis not present

## 2018-08-08 DIAGNOSIS — Z9049 Acquired absence of other specified parts of digestive tract: Secondary | ICD-10-CM | POA: Diagnosis not present

## 2018-08-08 DIAGNOSIS — Z85038 Personal history of other malignant neoplasm of large intestine: Secondary | ICD-10-CM | POA: Diagnosis not present

## 2018-08-08 DIAGNOSIS — Z79890 Hormone replacement therapy (postmenopausal): Secondary | ICD-10-CM | POA: Diagnosis not present

## 2018-08-08 DIAGNOSIS — E785 Hyperlipidemia, unspecified: Secondary | ICD-10-CM | POA: Diagnosis present

## 2018-08-08 DIAGNOSIS — R339 Retention of urine, unspecified: Secondary | ICD-10-CM | POA: Diagnosis not present

## 2018-08-08 DIAGNOSIS — R9431 Abnormal electrocardiogram [ECG] [EKG]: Secondary | ICD-10-CM | POA: Diagnosis present

## 2018-08-08 DIAGNOSIS — R296 Repeated falls: Secondary | ICD-10-CM | POA: Diagnosis present

## 2018-08-08 DIAGNOSIS — W010XXA Fall on same level from slipping, tripping and stumbling without subsequent striking against object, initial encounter: Secondary | ICD-10-CM | POA: Diagnosis present

## 2018-08-08 DIAGNOSIS — Z801 Family history of malignant neoplasm of trachea, bronchus and lung: Secondary | ICD-10-CM | POA: Diagnosis not present

## 2018-08-08 DIAGNOSIS — F419 Anxiety disorder, unspecified: Secondary | ICD-10-CM

## 2018-08-08 DIAGNOSIS — D509 Iron deficiency anemia, unspecified: Secondary | ICD-10-CM | POA: Diagnosis present

## 2018-08-08 DIAGNOSIS — S72141A Displaced intertrochanteric fracture of right femur, initial encounter for closed fracture: Secondary | ICD-10-CM | POA: Diagnosis present

## 2018-08-08 DIAGNOSIS — E063 Autoimmune thyroiditis: Secondary | ICD-10-CM | POA: Diagnosis present

## 2018-08-08 DIAGNOSIS — Z79899 Other long term (current) drug therapy: Secondary | ICD-10-CM | POA: Diagnosis not present

## 2018-08-08 DIAGNOSIS — Y92012 Bathroom of single-family (private) house as the place of occurrence of the external cause: Secondary | ICD-10-CM | POA: Diagnosis not present

## 2018-08-08 DIAGNOSIS — F411 Generalized anxiety disorder: Secondary | ICD-10-CM | POA: Diagnosis present

## 2018-08-08 DIAGNOSIS — Z8249 Family history of ischemic heart disease and other diseases of the circulatory system: Secondary | ICD-10-CM | POA: Diagnosis not present

## 2018-08-08 DIAGNOSIS — D62 Acute posthemorrhagic anemia: Secondary | ICD-10-CM | POA: Diagnosis not present

## 2018-08-08 HISTORY — PX: INTRAMEDULLARY (IM) NAIL INTERTROCHANTERIC: SHX5875

## 2018-08-08 LAB — ABO/RH: ABO/RH(D): A POS

## 2018-08-08 LAB — SURGICAL PCR SCREEN
MRSA, PCR: NEGATIVE
Staphylococcus aureus: NEGATIVE

## 2018-08-08 SURGERY — FIXATION, FRACTURE, INTERTROCHANTERIC, WITH INTRAMEDULLARY ROD
Anesthesia: Spinal | Laterality: Right

## 2018-08-08 MED ORDER — ONDANSETRON HCL 4 MG/2ML IJ SOLN
INTRAMUSCULAR | Status: DC | PRN
  Administered 2018-08-08: 4 mg via INTRAVENOUS

## 2018-08-08 MED ORDER — FENTANYL CITRATE (PF) 100 MCG/2ML IJ SOLN
25.0000 ug | INTRAMUSCULAR | Status: DC | PRN
  Administered 2018-08-08 (×2): 50 ug via INTRAVENOUS

## 2018-08-08 MED ORDER — DOCUSATE SODIUM 100 MG PO CAPS
100.0000 mg | ORAL_CAPSULE | Freq: Two times a day (BID) | ORAL | Status: DC
  Administered 2018-08-08: 100 mg via ORAL
  Filled 2018-08-08: qty 1

## 2018-08-08 MED ORDER — FENTANYL CITRATE (PF) 100 MCG/2ML IJ SOLN: 25.0000 ug | INTRAMUSCULAR | Status: DC | PRN

## 2018-08-08 MED ORDER — LORAZEPAM 0.5 MG PO TABS
0.5000 mg | ORAL_TABLET | Freq: Two times a day (BID) | ORAL | Status: DC | PRN
  Administered 2018-08-08 – 2018-08-09 (×2): 0.5 mg via ORAL
  Filled 2018-08-08 (×2): qty 1

## 2018-08-08 MED ORDER — ONDANSETRON HCL 4 MG/2ML IJ SOLN
4.0000 mg | Freq: Four times a day (QID) | INTRAMUSCULAR | Status: DC | PRN
  Administered 2018-08-08: 4 mg via INTRAVENOUS
  Filled 2018-08-08: qty 2

## 2018-08-08 MED ORDER — METOCLOPRAMIDE HCL 5 MG/ML IJ SOLN: 5.0000 mg | Freq: Three times a day (TID) | INTRAMUSCULAR | Status: DC | PRN

## 2018-08-08 MED ORDER — ASPIRIN EC 325 MG PO TBEC: 325.0000 mg | DELAYED_RELEASE_TABLET | Freq: Two times a day (BID) | ORAL | 3 refills | Status: AC

## 2018-08-08 MED ORDER — ASPIRIN EC 325 MG PO TBEC
325.0000 mg | DELAYED_RELEASE_TABLET | Freq: Every day | ORAL | Status: DC
  Administered 2018-08-09: 325 mg via ORAL
  Filled 2018-08-08: qty 1

## 2018-08-08 MED ORDER — HYDROMORPHONE HCL 1 MG/ML IJ SOLN
1.0000 mg | INTRAMUSCULAR | Status: DC | PRN
  Administered 2018-08-08 (×3): 1 mg via INTRAVENOUS
  Administered 2018-08-08: 0.5 mg via INTRAVENOUS
  Administered 2018-08-08 – 2018-08-09 (×4): 1 mg via INTRAVENOUS
  Filled 2018-08-08 (×8): qty 1

## 2018-08-08 MED ORDER — KETAMINE HCL 10 MG/ML IJ SOLN
INTRAMUSCULAR | Status: AC
  Filled 2018-08-08: qty 1

## 2018-08-08 MED ORDER — LEVOTHYROXINE SODIUM 88 MCG PO TABS
88.0000 ug | ORAL_TABLET | Freq: Every day | ORAL | Status: DC
  Administered 2018-08-09 – 2018-08-11 (×3): 88 ug via ORAL
  Filled 2018-08-08 (×3): qty 1

## 2018-08-08 MED ORDER — LACTATED RINGERS IV SOLN
INTRAVENOUS | Status: DC
  Administered 2018-08-08: 16:00:00 via INTRAVENOUS

## 2018-08-08 MED ORDER — METOCLOPRAMIDE HCL 5 MG PO TABS: 5.0000 mg | ORAL_TABLET | Freq: Three times a day (TID) | ORAL | Status: DC | PRN

## 2018-08-08 MED ORDER — PROPOFOL 10 MG/ML IV BOLUS
INTRAVENOUS | Status: DC | PRN
  Administered 2018-08-08 (×2): 20 mg via INTRAVENOUS
  Administered 2018-08-08: 10 mg via INTRAVENOUS
  Administered 2018-08-08: 20 mg via INTRAVENOUS
  Administered 2018-08-08: 10 mg via INTRAVENOUS

## 2018-08-08 MED ORDER — ONDANSETRON HCL 4 MG/2ML IJ SOLN: 4.0000 mg | Freq: Once | INTRAMUSCULAR | Status: DC | PRN

## 2018-08-08 MED ORDER — 0.9 % SODIUM CHLORIDE (POUR BTL) OPTIME
TOPICAL | Status: DC | PRN
  Administered 2018-08-08: 1000 mL

## 2018-08-08 MED ORDER — SODIUM CHLORIDE 0.9 % IV SOLN
INTRAVENOUS | Status: DC
  Administered 2018-08-08 – 2018-08-11 (×5): via INTRAVENOUS

## 2018-08-08 MED ORDER — FENTANYL CITRATE (PF) 100 MCG/2ML IJ SOLN
INTRAMUSCULAR | Status: AC
  Filled 2018-08-08: qty 2

## 2018-08-08 MED ORDER — METHOCARBAMOL 500 MG PO TABS
500.0000 mg | ORAL_TABLET | Freq: Four times a day (QID) | ORAL | Status: DC | PRN
  Administered 2018-08-08 – 2018-08-10 (×5): 500 mg via ORAL
  Filled 2018-08-08 (×5): qty 1

## 2018-08-08 MED ORDER — CLINDAMYCIN PHOSPHATE 900 MG/50ML IV SOLN
900.0000 mg | Freq: Once | INTRAVENOUS | Status: AC
  Administered 2018-08-08: 900 mg via INTRAVENOUS
  Filled 2018-08-08: qty 50

## 2018-08-08 MED ORDER — MENTHOL 3 MG MT LOZG: 1.0000 | LOZENGE | OROMUCOSAL | Status: DC | PRN

## 2018-08-08 MED ORDER — PHENOL 1.4 % MT LIQD
1.0000 | OROMUCOSAL | Status: DC | PRN
  Filled 2018-08-08: qty 177

## 2018-08-08 MED ORDER — PROPOFOL 10 MG/ML IV BOLUS
INTRAVENOUS | Status: AC
  Filled 2018-08-08: qty 40

## 2018-08-08 MED ORDER — KETAMINE HCL 50 MG/ML IJ SOLN
INTRAMUSCULAR | Status: DC | PRN
  Administered 2018-08-08: 5 mg via INTRAMUSCULAR
  Administered 2018-08-08 (×2): 10 mg via INTRAMUSCULAR

## 2018-08-08 MED ORDER — PROPOFOL 500 MG/50ML IV EMUL
INTRAVENOUS | Status: DC | PRN
  Administered 2018-08-08: 25 ug/kg/min via INTRAVENOUS

## 2018-08-08 SURGICAL SUPPLY — 53 items
BAG ZIPLOCK 12X15 (MISCELLANEOUS) ×2 IMPLANT
BNDG COHESIVE 4X5 TAN STRL (GAUZE/BANDAGES/DRESSINGS) ×2 IMPLANT
BNDG GAUZE ELAST 4 BULKY (GAUZE/BANDAGES/DRESSINGS) ×1 IMPLANT
BOOTIES KNEE HIGH SLOAN (MISCELLANEOUS) ×1 IMPLANT
COVER MAYO STAND STRL (DRAPES) ×1 IMPLANT
COVER SURGICAL LIGHT HANDLE (MISCELLANEOUS) ×2 IMPLANT
COVER WAND RF STERILE (DRAPES) ×1 IMPLANT
DRAPE STERI IOBAN 125X83 (DRAPES) ×1 IMPLANT
DRAPE SURG 17X11 SM STRL (DRAPES) ×2 IMPLANT
DRSG EMULSION OIL 3X16 NADH (GAUZE/BANDAGES/DRESSINGS) ×1 IMPLANT
DRSG MEPILEX BORDER 4X4 (GAUZE/BANDAGES/DRESSINGS) ×1 IMPLANT
DRSG PAD ABDOMINAL 8X10 ST (GAUZE/BANDAGES/DRESSINGS) ×1 IMPLANT
DURAPREP 26ML APPLICATOR (WOUND CARE) ×2 IMPLANT
ELECT REM PT RETURN 15FT ADLT (MISCELLANEOUS) ×2 IMPLANT
FACESHIELD WRAPAROUND (MASK) ×2 IMPLANT
FACESHIELD WRAPAROUND OR TEAM (MASK) IMPLANT
GAUZE SPONGE 4X4 12PLY STRL (GAUZE/BANDAGES/DRESSINGS) ×1 IMPLANT
GLOVE BIO SURGEON STRL SZ7.5 (GLOVE) ×1 IMPLANT
GLOVE BIOGEL PI IND STRL 6.5 (GLOVE) IMPLANT
GLOVE BIOGEL PI IND STRL 7.0 (GLOVE) IMPLANT
GLOVE BIOGEL PI IND STRL 7.5 (GLOVE) ×1 IMPLANT
GLOVE BIOGEL PI IND STRL 8 (GLOVE) ×1 IMPLANT
GLOVE BIOGEL PI INDICATOR 6.5 (GLOVE) ×1
GLOVE BIOGEL PI INDICATOR 7.0 (GLOVE) ×1
GLOVE BIOGEL PI INDICATOR 7.5 (GLOVE)
GLOVE BIOGEL PI INDICATOR 8 (GLOVE) ×1
GLOVE ECLIPSE 8.0 STRL XLNG CF (GLOVE) IMPLANT
GLOVE ORTHO TXT STRL SZ7.5 (GLOVE) ×2 IMPLANT
GLOVE SURG ORTHO 8.0 STRL STRW (GLOVE) ×1 IMPLANT
GLOVE SURG SS PI 6.5 STRL IVOR (GLOVE) ×1 IMPLANT
GOWN SPEC L3 XXLG W/TWL (GOWN DISPOSABLE) ×2 IMPLANT
GOWN STRL REUS W/TWL LRG LVL3 (GOWN DISPOSABLE) ×4 IMPLANT
GUIDEPIN 3.2X17.5 THRD DISP (PIN) ×1 IMPLANT
GUIDEWIRE BALL NOSE 100CM (WIRE) ×1 IMPLANT
GUIDEWIRE THREADED 2.8 (WIRE) ×4 IMPLANT
HFN RH 130 DEG 11MM X 360MM (Orthopedic Implant) ×1 IMPLANT
HIP FRAC NAIL LAG SCR 10.5X100 (Orthopedic Implant) ×1 IMPLANT
MANIFOLD NEPTUNE II (INSTRUMENTS) ×2 IMPLANT
NS IRRIG 1000ML POUR BTL (IV SOLUTION) ×2 IMPLANT
PACK GENERAL/GYN (CUSTOM PROCEDURE TRAY) ×2 IMPLANT
PAD CAST 4YDX4 CTTN HI CHSV (CAST SUPPLIES) ×1 IMPLANT
PADDING CAST COTTON 4X4 STRL (CAST SUPPLIES)
PADDING CAST COTTON 6X4 STRL (CAST SUPPLIES) ×1 IMPLANT
PROTECTOR NERVE ULNAR (MISCELLANEOUS) ×2 IMPLANT
SCREW CANN THRD AFF 10.5X100 (Orthopedic Implant) IMPLANT
STAPLER VISISTAT 35W (STAPLE) ×2 IMPLANT
SUT VIC AB 2-0 CT1 27 (SUTURE) ×1
SUT VIC AB 2-0 CT1 27XBRD (SUTURE) IMPLANT
SUT VIC AB 2-0 CT1 TAPERPNT 27 (SUTURE) ×2 IMPLANT
SUT VIC AB 2-0 FS1 27 (SUTURE) ×1 IMPLANT
TOWEL OR 17X26 10 PK STRL BLUE (TOWEL DISPOSABLE) ×3 IMPLANT
TRAY FOLEY MTR SLVR 16FR STAT (SET/KITS/TRAYS/PACK) IMPLANT
WATER STERILE IRR 1000ML POUR (IV SOLUTION) ×2 IMPLANT

## 2018-08-08 NOTE — Anesthesia Procedure Notes (Signed)
Spinal  Start time: 08/08/2018 4:45 PM End time: 08/08/2018 4:55 PM Staffing Anesthesiologist: Roberts Gaudy, MD Performed: anesthesiologist  Preanesthetic Checklist Completed: patient identified, site marked, surgical consent, pre-op evaluation, timeout performed, IV checked, risks and benefits discussed and monitors and equipment checked Spinal Block Patient position: right lateral decubitus Prep: ChloraPrep Patient monitoring: heart rate, cardiac monitor, continuous pulse ox and blood pressure Approach: midline Location: L4-5 Injection technique: single-shot Needle Needle type: [place]  Needle gauge: 22 G Assessment Sensory level: T6 Events: Attemted at L2-3 x 4 without success. One attempted at L3-4 with good CSF return Additional Notes 12 mg 0.75% Bupivacaine injected easily

## 2018-08-08 NOTE — ED Notes (Signed)
Moved patient from stretcher to a hospital bed. Repositioned patient for best comfort. Also, explained the delay in transferring patient upstairs.

## 2018-08-08 NOTE — ED Notes (Signed)
Pt verbalizes she does use her walker in the small apt she uses at the [facility]

## 2018-08-08 NOTE — Progress Notes (Signed)
PT Cancellation Note  Patient Details Name: Mackenzie Blanchard MRN: [id number] DOB: 1940-12-21   Cancelled Treatment:    Reason Eval/Treat Not Completed: Medical issues which prohibited therapy. Pt awaiting surgical intervention. Will sign off and await ortho MD order and recommendations. Thanks.    Weston Anna, PT Acute Rehabilitation Services Pager: [phone] Office: [phone]

## 2018-08-08 NOTE — ED Notes (Signed)
Pt comes from home nursing facility [facility]

## 2018-08-08 NOTE — Anesthesia Preprocedure Evaluation (Signed)
Anesthesia Evaluation  Patient identified by MRN, date of birth, ID band Patient awake    Reviewed: Allergy & Precautions, NPO status , Patient's Chart, lab work & pertinent test results  Airway Mallampati: II   Neck ROM: Full    Dental  (+) Teeth Intact, Dental Advisory Given   Pulmonary    breath sounds clear to auscultation       Cardiovascular  Rhythm:Regular Rate:Normal     Neuro/Psych    GI/Hepatic   Endo/Other    Renal/GU      Musculoskeletal   Abdominal   Peds  Hematology   Anesthesia Other Findings   Reproductive/Obstetrics                             Anesthesia Physical Anesthesia Plan  ASA: III  Anesthesia Plan: Spinal   Post-op Pain Management:    Induction: Intravenous  PONV Risk Score and Plan: Ondansetron and Dexamethasone  Airway Management Planned: Natural Airway and Simple Face Mask  Additional Equipment:   Intra-op Plan:   Post-operative Plan:   Informed Consent: I have reviewed the patients History and Physical, chart, labs and discussed the procedure including the risks, benefits and alternatives for the proposed anesthesia with the patient or authorized representative who has indicated his/her understanding and acceptance.   Dental advisory given  Plan Discussed with: CRNA and Anesthesiologist  Anesthesia Plan Comments:         Anesthesia Quick Evaluation

## 2018-08-08 NOTE — Progress Notes (Signed)
Initial Nutrition Assessment  INTERVENTION:   -Once diet is advanced, family to provide meals that patient will eat  -Family to bring protein supplement that pt uses at home  NUTRITION DIAGNOSIS:   Increased nutrient needs related to post-op healing as evidenced by estimated needs.  GOAL:   Patient will meet greater than or equal to 90% of their needs  MONITOR:   Diet advancement, Labs, Weight trends, I & O's  REASON FOR ASSESSMENT:   Consult Hip fracture protocol  ASSESSMENT:   78 y.o. female past medical history significant for anxiety presents to the ED with severe right hip pain after a mechanical fall in the bathroom today.  [facility] was [facility] and recommended ORIF on 08/08/2018  Patient in room with daughter at bedside. Patient awaiting hip surgery today. Unable to speak with pt as pt gets agitated when woken and per daughter, pt will not be receptive to diet advice.  Pt's daughter reports pt has been eating well and this was also endorsed by caregiver that came later to conversation. Pt typically consumes mashed potatoes, chicken and a lot of vegetables. Pt is very strict on her diet and allows no salt to be added to food or any high salt foods. Per daughter, last time pt was discharged to a SNF she refused to eat the food and lost 20 lb. Family and caregiver are more than willing to provide meals to patient and protein supplements she enjoys once diet is advanced.  Per weight records, pt's weight is stable.   Labs reviewed. Medications: Remeron tablet daily  NUTRITION - FOCUSED PHYSICAL EXAM:  Deferred. Per pt's daughter, pt gets agitated when woken up.  Diet Order:   Diet Order            Diet NPO time specified Except for: Sips with Meds  Diet effective midnight              EDUCATION NEEDS:   Education needs have been addressed  Skin:  Skin Assessment: Reviewed RN Assessment  Last BM:  1/6  Height:   Ht Readings from Last 1 Encounters:   08/08/18 5\' 6"  (1.676 m)    Weight:   Wt Readings from Last 1 Encounters:  08/08/18 53.4 kg    Ideal Body Weight:  59.1 kg  BMI:  Body mass index is 19 kg/m.  Estimated Nutritional Needs:   Kcal:  1500-1700  Protein:  65-75g  Fluid:  1.7L/day   Mackenzie Bibles, MS, RD, LDN [place] Dietitian Pager: [phone] After Hours Pager: [phone]

## 2018-08-08 NOTE — ED Notes (Signed)
ED TO INPATIENT HANDOFF REPORT  Name/Age/Gender Mackenzie Blanchard 78 y.o. female  Code Status    Code Status Orders  (From admission, onward)         Start     Ordered   08/08/18 0159  Full code  Continuous     08/08/18 0202        Code Status History    Date Active Date Inactive Code Status Order ID Comments User Context   11/18/2015 0119 11/21/2015 1825 Full Code [id number]  Georganna Skeans, MD ED   03/22/2015 2009 03/25/2015 1831 Full Code [id number]  Charlynne Cousins, MD Inpatient      Home/SNF/Other Home  Chief Complaint Fall   Level of Care/Admitting Diagnosis ED Disposition    ED Disposition Condition [place]: [facility] [100102]  Level of Care: Med-Surg [16]  Diagnosis: Closed right hip fracture, initial encounter [facility]) [[id number]]  Admitting Physician: Doreatha Massed  Attending Physician: Etta Quill [phone]  Estimated length of stay: past midnight tomorrow  Certification:: I certify this patient will need inpatient services for at least 2 midnights  PT Class (Do Not Modify): Inpatient [101]  PT Acc Code (Do Not Modify): Private [1]       Medical History Past Medical History:  Diagnosis Date  . Anxiety   . Arthritis    "neck and upper back" (11/18/2015)  . Chronic right shoulder pain    w/limited ROM (11/18/2015)  . Colon cancer ([place])   . Depression   . Edema    idiopathic  . Fall at home 11/18/2015   right rib fractures with pneumothorax  . Hashimoto's disease   . Hyperlipidemia   . Hypothyroidism   . Osteopenia   . Sinusitis, chronic     Allergies Allergies  Allergen Reactions  . Codeine Other (See Comments)    Hallucinations   . Penicillins Rash    DID THE REACTION INVOLVE: Swelling of the face/tongue/throat, SOB, or low BP? Yes Sudden or severe rash/hives, skin peeling, or the inside of the mouth or nose? No Did it require medical treatment? No When did it last happen?30 yrs  ago If all above answers are "NO", may proceed with cephalosporin use.     IV Location/Drains/Wounds Patient Lines/Drains/Airways Status   Active Line/Drains/Airways    None          Labs/Imaging Results for orders placed or performed during the hospital encounter of 08/07/18 (from the past 48 hour(s))  Comprehensive metabolic panel     Status: Abnormal   Collection Time: 08/07/18 10:09 PM  Result Value Ref Range   Sodium 136 135 - 145 mmol/L   Potassium 3.6 3.5 - 5.1 mmol/L   Chloride 102 98 - 111 mmol/L   CO2 24 22 - 32 mmol/L   Glucose, Bld 105 (H) 70 - 99 mg/dL   BUN 14 8 - 23 mg/dL   Creatinine, Ser 0.48 0.44 - 1.00 mg/dL   Calcium 8.7 (L) 8.9 - 10.3 mg/dL   Total Protein 5.9 (L) 6.5 - 8.1 g/dL   Albumin 3.8 3.5 - 5.0 g/dL   AST 15 15 - 41 U/L   ALT 15 0 - 44 U/L   Alkaline Phosphatase 41 38 - 126 U/L   Total Bilirubin 0.4 0.3 - 1.2 mg/dL   GFR calc non Af Amer >60 >60 mL/min   GFR calc Af Amer >60 >60 mL/min   Anion gap 10 5 - 15  Comment: Performed at [facility], [place] [address], [place], [place] [address]  CBC     Status: None   Collection Time: 08/07/18 10:09 PM  Result Value Ref Range   WBC 8.0 4.0 - 10.5 K/uL   RBC 3.95 3.87 - 5.11 MIL/uL   Hemoglobin 12.2 12.0 - 15.0 g/dL   HCT 37.9 36.0 - 46.0 %   MCV 95.9 80.0 - 100.0 fL   MCH 30.9 26.0 - 34.0 pg   MCHC 32.2 30.0 - 36.0 g/dL   RDW 12.4 11.5 - 15.5 %   Platelets 252 150 - 400 K/uL   nRBC 0.0 0.0 - 0.2 %    Comment: Performed at [facility], [place] [address], [place], [place] [address]   Dg Chest 2 View  Result Date: 08/07/2018 CLINICAL DATA:  Mechanical fall EXAM: CHEST - 2 VIEW COMPARISON:  11/21/2015, 11/17/2015 FINDINGS: No large pleural effusion. No acute airspace disease or pneumothorax. Stable cardiomediastinal silhouette with mildly tortuous aorta. Old right fourth fifth and sixth rib fractures. Age indeterminate mild compression deformities of upper  thoracic vertebra. IMPRESSION: 1. Negative for pleural effusion or pneumothorax 2. Age indeterminate mild compression deformities of 2 adjacent upper thoracic vertebra Electronically Signed   By: Donavan Foil M.D.   On: 08/07/2018 21:30   Ct Head Wo Contrast  Result Date: 08/07/2018 CLINICAL DATA:  Head trauma EXAM: CT HEAD WITHOUT CONTRAST TECHNIQUE: Contiguous axial images were obtained from the base of the skull through the vertex without intravenous contrast. COMPARISON:  Head CT 829 2016 FINDINGS: Brain: No acute territorial infarction, hemorrhage, or intracranial mass. Mild atrophy. Mild small vessel ischemic changes of the white matter. Vascular: No hyperdense vessels. Scattered calcifications at the carotid siphons. Skull: No fracture Sinuses/Orbits: No acute finding. Other: None IMPRESSION: 1. No CT evidence for acute intracranial abnormality. 2. Atrophy and mild small vessel ischemic changes of the white matter Electronically Signed   By: Donavan Foil M.D.   On: 08/07/2018 23:13   Ct Thoracic Spine Wo Contrast  Result Date: 08/07/2018 CLINICAL DATA:  [place] in bathroom EXAM: CT THORACIC SPINE WITHOUT CONTRAST TECHNIQUE: Multidetector CT images of the thoracic were obtained using the standard protocol without intravenous contrast. COMPARISON:  Chest x-ray 08/07/2018 FINDINGS: Alignment: Kyphosis of the spine. No subluxation. Facet alignment within normal limits. Vertebrae: Age indeterminate mild superior endplate deformity at T4. Age indeterminate mild compression at T5 with less than 20% loss of height of the anterior vertebral body. Paraspinal and other soft tissues: No significant paravertebral or paraspinal soft tissue abnormality. Aortic atherosclerosis. 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 Lung fields demonstrate scarring at the apices. Mild bronchiectasis in the right middle lobe. Disc levels: Multiple level degenerative osteophytes anteriorly. Multiple old right posterior rib fractures.  IMPRESSION: 1. Age indeterminate mild superior endplate deformity at T4 and mild age indeterminate compression deformity T5 but new since 2017 thoracic radiographs. MRI would be more sensitive for detection of marrow edema and assessing for fracture acuity. Negative for retropulsion or bony canal compromise. Electronically Signed   By: Donavan Foil M.D.   On: 08/07/2018 23:22   Dg Hip Unilat  With Pelvis 2-3 Views Right  Result Date: 08/07/2018 CLINICAL DATA:  Fall with hip pain EXAM: DG HIP (WITH OR WITHOUT PELVIS) 2-3V RIGHT COMPARISON:  None. FINDINGS: SI joints are non widened.  Pubic symphysis and rami are intact. Acute comminuted intertrochanteric fracture with medially displaced lesser trochanteric fracture fragment and mild cephalad displacement of  the distal femur. Right femoral head projects in joint. IMPRESSION: Acute comminuted and displaced right femoral intertrochanteric fracture Electronically Signed   By: Donavan Foil M.D.   On: 08/07/2018 21:31   None  Pending Labs [employer] (From admission, onward)    Start     Ordered   Signed and Held  Type and screen [place],   STAT    Comments:  [place]    Signed and Held          Vitals/Pain Today's Vitals   08/07/18 2314 08/07/18 2314 08/08/18 0023 08/08/18 0124  BP:  (!) 146/89    Pulse:  98    Resp:  16    SpO2:  96%    PainSc: 10-Worst pain ever  10-Worst pain ever 10-Worst pain ever    Isolation Precautions No active isolations  Medications Medications  traZODone (DESYREL) tablet 75 mg (75 mg Oral Given 08/07/18 2314)  mirtazapine (REMERON) tablet 45 mg (45 mg Oral Given 08/07/18 2314)  Melatonin TABS 5 mg (5 mg Oral Given 08/07/18 2355)  HYDROmorphone (DILAUDID) injection 1 mg (has no administration in time range)  0.9 %  sodium chloride infusion (has no administration in time range)  levothyroxine (SYNTHROID, LEVOTHROID) tablet 88 mcg (has no  administration in time range)  morphine 4 MG/ML injection 3 mg (3 mg Intravenous Given 08/07/18 2230)  morphine 4 MG/ML injection 4 mg (4 mg Intravenous Given 08/08/18 0024)    Mobility walks with device walker.

## 2018-08-08 NOTE — Op Note (Signed)
08/07/2018 - 08/08/2018  4:27 PM  PATIENT:  Mackenzie Blanchard  78 y.o. female  PRE-OPERATIVE DIAGNOSIS:  Right intertrochanteric proximal femur fx  POST-OPERATIVE DIAGNOSIS:  Same  PROCEDURE:  ORIF R intertrochanteric femur fracture  SURGEON: Rayvon Char. Grandville [place], MD  PHYSICIAN ASSISTANT: Morley Kos, OPA-C  ANESTHESIA:  spinal and MAC  SPECIMENS:  None  DRAINS:   None  EBL:  less than 100 mL  PREOPERATIVE INDICATIONS:  Mackenzie Blanchard is a  78 y.o. female with displaced right intertrochanteric femur fracture  The risks benefits and alternatives were discussed with the patient preoperatively including but not limited to the risks of infection, bleeding, nerve injury, cardiopulmonary complications, the need for revision surgery, among others, and the patient verbalized understanding and consented to proceed.  OPERATIVE IMPLANTS: Biomet AFFIXUS hip fracture nail, 11 x 360 mm, with large sliding hip screw  OPERATIVE PROCEDURE:  After receiving prophylactic antibiotics, the patient was escorted to the operative theatre and placed in a supine position.  She was turned lateral and spinal anesthetic rendered.  She was then repositioned onto the fracture table, with the left leg up in the well-leg holder and the right lower extremity in boot traction.  The fracture was manipulated via the traction table to achieve near-anatomic alignment which was confirmed fluoroscopically.  A surgical "time-out" was performed during which the planned procedure, proposed operative site, and the correct patient identity were compared to the operative consent and agreement confirmed by the circulating nurse according to current facility policy.  The limb was prepped with DuraPrep and draped in usual sterile fashion.  Using fluoroscopic guidance, the appropriate place for guidepin insertion was ascertained and the guidepin was inserted and adjusted fluoroscopically to the midportion of the greater trochanter.  It was  then inserted into the shaft of the femur.  Small incision was made around the pin and then soft tissue protector was inserted down the level of the top of the greater trochanter.  The entry reamer was then advanced through the greater trochanter and removed.  The ball-tipped guide rod was then placed down to an appropriate level of depth and the length of nail measured.  The nail was then inserted, advanced to the appropriate depth, and the sliding hip screw placed in standard fashion through the jig, through its own separate incision.  An attempt was made to place it slightly posterior and central in the head.  The appropriate length was measured and the screw placed uneventfully.  Traction was then let off the foot.  The sliding screw was then locked, and backed off a quarter turn to allow it to slide.  Final images were obtained, the wounds irrigated, and the skin closed with 2-0 Vicryl buried subcuticular interrupted sutures followed by staples of the skin.  Mepilex dressings were applied.  She was taken to the recovery room in stable condition.  DISPOSITION: To be discharged back to the floor for routine postoperative care, likely to be discharged to SNF, weightbearing as tolerated, with follow-up in my office in approximately 2 weeks.  VTE prophylaxis to be aspirin, 325 mg twice daily x1 month, and SCDs in the hospital.

## 2018-08-08 NOTE — Progress Notes (Signed)
[facility] PROGRESS NOTE    Progress Note  Mackenzie Blanchard  JEH:[id number] DOB: September 23, 1940 DOA: 08/07/2018 PCP: Lajean Manes, MD     Brief Narrative:   Mackenzie Blanchard is an 78 y.o. female past medical history significant for anxiety presents to the ED with severe right hip pain after a mechanical fall in the bathroom today.  [facility] was consulted and recommended ORIF on 08/08/2018  Assessment/Plan:   Closed right hip fracture, initial encounter [facility]) Place n.p.o. continue Robaxin narcotics for pain. It is low to moderate risk for cardiac complications. Orthopedic surgery has been consulted who recommended intervention on 08/08/2018  General anxiety disorder : Current home meds. Start her low dose ativan by mouth  DVT prophylaxis: SCD's Family Communication:daughter Disposition Plan/Barrier to D/C: SNF in 3 days Code Status:     Code Status Orders  (From admission, onward)         Start     Ordered   08/08/18 0159  Full code  Continuous     08/08/18 0202        Code Status History    Date Active Date Inactive Code Status Order ID Comments User Context   11/18/2015 0119 11/21/2015 1825 Full Code [id number]  Georganna Skeans, MD ED   03/22/2015 2009 03/25/2015 1831 Full Code [id number]  Charlynne Cousins, MD Inpatient        IV Access:    Peripheral IV   Procedures and diagnostic studies:   Dg Chest 2 View  Result Date: 08/07/2018 CLINICAL DATA:  Mechanical fall EXAM: CHEST - 2 VIEW COMPARISON:  11/21/2015, 11/17/2015 FINDINGS: No large pleural effusion. No acute airspace disease or pneumothorax. Stable cardiomediastinal silhouette with mildly tortuous aorta. Old right fourth fifth and sixth rib fractures. Age indeterminate mild compression deformities of upper thoracic vertebra. IMPRESSION: 1. Negative for pleural effusion or pneumothorax 2. Age indeterminate mild compression deformities of 2 adjacent upper thoracic vertebra Electronically Signed   By:  Donavan Foil M.D.   On: 08/07/2018 21:30   Ct Head Wo Contrast  Result Date: 08/07/2018 CLINICAL DATA:  Head trauma EXAM: CT HEAD WITHOUT CONTRAST TECHNIQUE: Contiguous axial images were obtained from the base of the skull through the vertex without intravenous contrast. COMPARISON:  Head CT 829 2016 FINDINGS: Brain: No acute territorial infarction, hemorrhage, or intracranial mass. Mild atrophy. Mild small vessel ischemic changes of the white matter. Vascular: No hyperdense vessels. Scattered calcifications at the carotid siphons. Skull: No fracture Sinuses/Orbits: No acute finding. Other: None IMPRESSION: 1. No CT evidence for acute intracranial abnormality. 2. Atrophy and mild small vessel ischemic changes of the white matter Electronically Signed   By: Donavan Foil M.D.   On: 08/07/2018 23:13   Ct Thoracic Spine Wo Contrast  Result Date: 08/07/2018 CLINICAL DATA:  [place] in bathroom EXAM: CT THORACIC SPINE WITHOUT CONTRAST TECHNIQUE: Multidetector CT images of the thoracic were obtained using the standard protocol without intravenous contrast. COMPARISON:  Chest x-ray 08/07/2018 FINDINGS: Alignment: Kyphosis of the spine. No subluxation. Facet alignment within normal limits. Vertebrae: Age indeterminate mild superior endplate deformity at T4. Age indeterminate mild compression at T5 with less than 20% loss of height of the anterior vertebral body. Paraspinal and other soft tissues: No significant paravertebral or paraspinal soft tissue abnormality. Aortic atherosclerosis. 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 Lung fields demonstrate scarring at the apices. Mild bronchiectasis in the right middle lobe. Disc levels: Multiple level degenerative osteophytes anteriorly. Multiple old right posterior rib fractures.  IMPRESSION: 1. Age indeterminate mild superior endplate deformity at T4 and mild age indeterminate compression deformity T5 but new since 2017 thoracic radiographs. MRI would be more sensitive for  detection of marrow edema and assessing for fracture acuity. Negative for retropulsion or bony canal compromise. Electronically Signed   By: Donavan Foil M.D.   On: 08/07/2018 23:22   Dg Hip Unilat  With Pelvis 2-3 Views Right  Result Date: 08/07/2018 CLINICAL DATA:  Fall with hip pain EXAM: DG HIP (WITH OR WITHOUT PELVIS) 2-3V RIGHT COMPARISON:  None. FINDINGS: SI joints are non widened.  Pubic symphysis and rami are intact. Acute comminuted intertrochanteric fracture with medially displaced lesser trochanteric fracture fragment and mild cephalad displacement of the distal femur. Right femoral head projects in joint. IMPRESSION: Acute comminuted and displaced right femoral intertrochanteric fracture Electronically Signed   By: Donavan Foil M.D.   On: 08/07/2018 21:31     Medical Consultants:    None.  Anti-Infectives:   None  Subjective:    Mackenzie Blanchard relates she is very nervous and anxious with everything that is happening.  Objective:    Vitals:   08/07/18 2314 08/08/18 0333  BP: (!) 146/89 139/65  Pulse: 98 87  Resp: 16 16  Temp:  98.4 F (36.9 C)  TempSrc:  Oral  SpO2: 96% 100%  Weight:  53.4 kg  Height:  5\' 6"  (1.676 m)    Intake/Output Summary (Last 24 hours) at 08/08/2018 0825 Last data filed at 08/08/2018 0700 Gross per 24 hour  Intake 199.03 ml  Output 0 ml  Net 199.03 ml   Filed Weights   08/08/18 0333  Weight: 53.4 kg    Exam: General exam: In no acute distress. Respiratory system: Good air movement and clear to auscultation. Cardiovascular system: S1 & S2 heard, RRR.   Gastrointestinal system: Abdomen is nondistended, soft and nontender.  Central nervous system: Alert and oriented. No focal neurological deficits. Extremities: No pedal edema. Skin: No rashes, lesions or ulcers Psychiatry: Judgement and insight appear normal. Mood & affect appropriate.    Data Reviewed:    Labs: Basic Metabolic Panel: Recent Labs  Lab 08/07/18 2209  NA  136  K 3.6  CL 102  CO2 24  GLUCOSE 105*  BUN 14  CREATININE 0.48  CALCIUM 8.7*   GFR Estimated Creatinine Clearance: 48.1 mL/min (by C-G formula based on SCr of 0.48 mg/dL). Liver Function Tests: Recent Labs  Lab 08/07/18 2209  AST 15  ALT 15  ALKPHOS 41  BILITOT 0.4  PROT 5.9*  ALBUMIN 3.8   No results for input(s): LIPASE, AMYLASE in the last 168 hours. No results for input(s): AMMONIA in the last 168 hours. Coagulation profile No results for input(s): INR, PROTIME in the last 168 hours.  CBC: Recent Labs  Lab 08/07/18 2209  WBC 8.0  HGB 12.2  HCT 37.9  MCV 95.9  PLT 252   Cardiac Enzymes: No results for input(s): CKTOTAL, CKMB, CKMBINDEX, TROPONINI in the last 168 hours. BNP (last 3 results) No results for input(s): PROBNP in the last 8760 hours. CBG: No results for input(s): GLUCAP in the last 168 hours. D-Dimer: No results for input(s): DDIMER in the last 72 hours. Hgb A1c: No results for input(s): HGBA1C in the last 72 hours. Lipid Profile: No results for input(s): CHOL, HDL, LDLCALC, TRIG, CHOLHDL, LDLDIRECT in the last 72 hours. Thyroid function studies: No results for input(s): TSH, T4TOTAL, T3FREE, THYROIDAB in the last 72 hours.  Invalid  input(s): FREET3 Anemia work up: No results for input(s): VITAMINB12, FOLATE, FERRITIN, TIBC, IRON, RETICCTPCT in the last 72 hours. Sepsis Labs: Recent Labs  Lab 08/07/18 2209  WBC 8.0   Microbiology Recent Results (from the past 240 hour(s))  Surgical pcr screen     Status: None   Collection Time: 08/08/18  4:01 AM  Result Value Ref Range Status   MRSA, PCR NEGATIVE NEGATIVE Final   Staphylococcus aureus NEGATIVE NEGATIVE Final    Comment: (NOTE) The Xpert SA Assay (FDA approved for NASAL specimens in patients 26 years of age and older), is one component of a comprehensive surveillance program. It is not intended to diagnose infection nor to guide or monitor treatment. Performed at [facility], [place] [address], [place], [place] [address]      Medications:   . levothyroxine  88 mcg Oral Daily  . Melatonin  5 mg Oral QHS  . mirtazapine  45 mg Oral QHS  . traZODone  75 mg Oral QHS   Continuous Infusions: . sodium chloride 60 mL/hr at 08/08/18 0700     LOS: 0 days   Charlynne Cousins  [facility]   *Please refer to [email or web address], password [email or web address] to get updated schedule on who will round on this patient, as hospitalists switch teams weekly. If 7PM-7AM, please contact night-coverage at [email or web address], password [email or web address] for any overnight needs.  08/08/2018, 8:25 AM

## 2018-08-08 NOTE — Transfer of Care (Signed)
Immediate Anesthesia Transfer of Care Note  Patient: Mackenzie Blanchard  Procedure(s) Performed: Procedure(s): INTRAMEDULLARY (IM) NAIL INTERTROCHANTRIC (Right)  Patient Location: PACU  Anesthesia Type:Spinal  Level of Consciousness:  sedated, patient cooperative and responds to stimulation  Airway & Oxygen Therapy:Patient Spontanous Breathing and Patient connected to face mask oxgen  Post-op Assessment:  Report given to PACU RN and Post -op Vital signs reviewed and stable  Post vital signs:  Reviewed and stable  Last Vitals:  Vitals:   08/08/18 1311 08/08/18 1800  BP: (!) 115/56 (!) 143/78  Pulse: 83 73  Resp: 16 18  Temp: 36.8 C 36.6 C  SpO2: 13% 143%    Complications: No apparent anesthesia complications

## 2018-08-08 NOTE — Plan of Care (Signed)
Plan of care reviewed with the patient and her daughter Mackenzie Blanchard)

## 2018-08-08 NOTE — Anesthesia Postprocedure Evaluation (Signed)
Anesthesia Post Note  Patient: Mackenzie Blanchard  Procedure(s) Performed: INTRAMEDULLARY (IM) NAIL INTERTROCHANTRIC (Right )     Patient location during evaluation: PACU Anesthesia Type: Spinal Level of consciousness: oriented and awake and alert Pain management: pain level controlled Vital Signs Assessment: post-procedure vital signs reviewed and stable Respiratory status: spontaneous breathing, respiratory function stable and patient connected to nasal cannula oxygen Cardiovascular status: blood pressure returned to baseline and stable Postop Assessment: no headache, no backache and no apparent nausea or vomiting Anesthetic complications: no    Last Vitals:  Vitals:   08/08/18 1815 08/08/18 1830  BP: (!) 154/80 127/68  Pulse: 87 83  Resp: 13 14  Temp:  36.8 C  SpO2: 95% 95%    Last Pain:  Vitals:   08/08/18 1830  TempSrc:   PainSc: Asleep                 Kamyla Olejnik COKER

## 2018-08-08 NOTE — H&P (Signed)
History and Physical    Mackenzie Blanchard:[id number] DOB: 1941-04-19 DOA: 08/07/2018  PCP: Lajean Manes, MD  Patient coming from: Home  I have personally briefly reviewed patient's old medical records in [place]  Chief Complaint: Fall, Hip pain  HPI: Mackenzie Blanchard is a 78 y.o. female with medical history significant of Anxiety, arthritis.  Patient presents to the ED with c/o severe R hip pain following a mechanical fall in the bathroom at home today.  Pain worse with movement, unable to bear weight.   ED Course: R IT hip fx.  ORIF tomorrow afternoon per Dr. Grandville Silos.   Review of Systems: As per HPI otherwise 10 point review of systems negative.   Past Medical History:  Diagnosis Date  . Anxiety   . Arthritis    "neck and upper back" (11/18/2015)  . Chronic right shoulder pain    w/limited ROM (11/18/2015)  . Colon cancer ([place])   . Depression   . Edema    idiopathic  . Fall at home 11/18/2015   right rib fractures with pneumothorax  . Hashimoto's disease   . Hyperlipidemia   . Hypothyroidism   . Osteopenia   . Sinusitis, chronic     Past Surgical History:  Procedure Laterality Date  . CATARACT EXTRACTION W/ INTRAOCULAR LENS  IMPLANT, BILATERAL Bilateral    bilaterally  . COLECTOMY  05/2013  . COLONOSCOPY WITH PROPOFOL N/A 11/16/2012   Procedure: COLONOSCOPY WITH PROPOFOL;  Surgeon: Garlan Fair, MD;  Location: [facility];  Service: Endoscopy;  Laterality: N/A;  h&p in file-[place]  . TONSILLECTOMY    . TUBAL LIGATION       reports that she has never smoked. She has never used smokeless tobacco. She reports that she does not drink alcohol or use drugs.  Allergies  Allergen Reactions  . Codeine Other (See Comments)    Hallucinations   . Penicillins Rash    DID THE REACTION INVOLVE: Swelling of the face/tongue/throat, SOB, or low BP? Yes Sudden or severe rash/hives, skin peeling, or the inside of the mouth or nose? No Did it require medical  treatment? No When did it last happen?30 yrs ago If all above answers are "NO", may proceed with cephalosporin use.     Family History  Problem Relation Age of Onset  . Lung cancer Mother   . Hypertension Father   . Heart disease Father   . Cancer - Other Brother   . Diabetes Brother      Prior to Admission medications   Medication Sig Start Date End Date Taking? Authorizing Provider  acetaminophen (TYLENOL) 325 MG tablet Take 650 mg by mouth at bedtime.    Yes [provider]  cholecalciferol (VITAMIN D) 1000 units tablet Take 1,000 Units by mouth 2 (two) times daily.    Yes [provider]  Coenzyme Q10 (CO Q10) 100 MG CAPS Take 400 mg by mouth daily.    Yes [provider]  diphenhydrAMINE (BENADRYL) 25 MG tablet Take 25 mg by mouth at bedtime as needed for itching or sleep.   Yes [provider]  levothyroxine (SYNTHROID, LEVOTHROID) 88 MCG tablet Take 88 mcg by mouth daily.    Yes [provider]  Lutein 20 MG CAPS Take 40 mg by mouth daily.    Yes [provider]  Melatonin 5 MG CAPS Take 15 mg by mouth at bedtime.    Yes [provider]  mirtazapine (REMERON SOL-TAB) 45 MG disintegrating tablet  Take 45 mg by mouth at bedtime.   Yes [provider]  Multiple Vitamin (MULTIVITAMIN) tablet Take 1 tablet by mouth daily.   Yes [provider]  Potassium 99 MG TABS Take 10 tablets by mouth daily. Taking around 2000mg  daily   Yes [provider]  traZODone (DESYREL) 50 MG tablet Take 75 mg by mouth at bedtime.    Yes [provider]  loperamide (IMODIUM) 2 MG capsule Take 1 capsule (2 mg total) by mouth as needed for diarrhea or loose stools. Patient not taking: Reported on 08/07/2018 03/25/15   Mackenzie Redwood, MD    Physical Exam: Vitals:   08/07/18 2314  BP: (!) 146/89  Pulse: 98  Resp: 16  SpO2: 96%    Constitutional: NAD, calm, comfortable Eyes: PERRL, lids and  conjunctivae normal ENMT: Mucous membranes are moist. Posterior pharynx clear of any exudate or lesions.Normal dentition.  Neck: normal, supple, no masses, no thyromegaly Respiratory: clear to auscultation bilaterally, no wheezing, no crackles. Normal respiratory effort. No accessory muscle use.  Cardiovascular: Regular rate and rhythm, no murmurs / rubs / gallops. No extremity edema. 2+ pedal pulses. No carotid bruits.  Abdomen: no tenderness, no masses palpated. No hepatosplenomegaly. Bowel sounds positive.  Musculoskeletal: R hip TTP, DP pulse present in foot. Skin: no rashes, lesions, ulcers. No induration Neurologic: CN 2-12 grossly intact. Sensation intact, DTR normal. Strength 5/5 in all 4.  Psychiatric: Normal judgment and insight. Alert and oriented x 3. Normal mood.    Labs on Admission: I have personally reviewed following labs and imaging studies  CBC: Recent Labs  Lab 08/07/18 2209  WBC 8.0  HGB 12.2  HCT 37.9  MCV 95.9  PLT 606   Basic Metabolic Panel: Recent Labs  Lab 08/07/18 2209  NA 136  K 3.6  CL 102  CO2 24  GLUCOSE 105*  BUN 14  CREATININE 0.48  CALCIUM 8.7*   GFR: CrCl cannot be calculated (Unknown ideal weight.). Liver Function Tests: Recent Labs  Lab 08/07/18 2209  AST 15  ALT 15  ALKPHOS 41  BILITOT 0.4  PROT 5.9*  ALBUMIN 3.8   No results for input(s): LIPASE, AMYLASE in the last 168 hours. No results for input(s): AMMONIA in the last 168 hours. Coagulation Profile: No results for input(s): INR, PROTIME in the last 168 hours. Cardiac Enzymes: No results for input(s): CKTOTAL, CKMB, CKMBINDEX, TROPONINI in the last 168 hours. BNP (last 3 results) No results for input(s): PROBNP in the last 8760 hours. HbA1C: No results for input(s): HGBA1C in the last 72 hours. CBG: No results for input(s): GLUCAP in the last 168 hours. Lipid Profile: No results for input(s): CHOL, HDL, LDLCALC, TRIG, CHOLHDL, LDLDIRECT in the last 72  hours. Thyroid Function Tests: No results for input(s): TSH, T4TOTAL, FREET4, T3FREE, THYROIDAB in the last 72 hours. Anemia Panel: No results for input(s): VITAMINB12, FOLATE, FERRITIN, TIBC, IRON, RETICCTPCT in the last 72 hours. Urine analysis:    Component Value Date/Time   COLORURINE YELLOW 03/22/2015 1636   APPEARANCEUR TURBID (A) 03/22/2015 1636   LABSPEC 1.008 03/22/2015 1636   PHURINE 7.0 03/22/2015 1636   GLUCOSEU NEGATIVE 03/22/2015 1636   HGBUR MODERATE (A) 03/22/2015 1636   BILIRUBINUR NEGATIVE 03/22/2015 1636   KETONESUR NEGATIVE 03/22/2015 1636   PROTEINUR NEGATIVE 03/22/2015 1636   UROBILINOGEN 0.2 03/22/2015 1636   NITRITE POSITIVE (A) 03/22/2015 1636   LEUKOCYTESUR LARGE (A) 03/22/2015 1636    Radiological Exams on Admission: Dg Chest 2  View  Result Date: 08/07/2018 CLINICAL DATA:  Mechanical fall EXAM: CHEST - 2 VIEW COMPARISON:  11/21/2015, 11/17/2015 FINDINGS: No large pleural effusion. No acute airspace disease or pneumothorax. Stable cardiomediastinal silhouette with mildly tortuous aorta. Old right fourth fifth and sixth rib fractures. Age indeterminate mild compression deformities of upper thoracic vertebra. IMPRESSION: 1. Negative for pleural effusion or pneumothorax 2. Age indeterminate mild compression deformities of 2 adjacent upper thoracic vertebra Electronically Signed   By: Donavan Foil M.D.   On: 08/07/2018 21:30   Ct Head Wo Contrast  Result Date: 08/07/2018 CLINICAL DATA:  Head trauma EXAM: CT HEAD WITHOUT CONTRAST TECHNIQUE: Contiguous axial images were obtained from the base of the skull through the vertex without intravenous contrast. COMPARISON:  Head CT 829 2016 FINDINGS: Brain: No acute territorial infarction, hemorrhage, or intracranial mass. Mild atrophy. Mild small vessel ischemic changes of the white matter. Vascular: No hyperdense vessels. Scattered calcifications at the carotid siphons. Skull: No fracture Sinuses/Orbits: No acute finding.  Other: None IMPRESSION: 1. No CT evidence for acute intracranial abnormality. 2. Atrophy and mild small vessel ischemic changes of the white matter Electronically Signed   By: Donavan Foil M.D.   On: 08/07/2018 23:13   Ct Thoracic Spine Wo Contrast  Result Date: 08/07/2018 CLINICAL DATA:  [place] in bathroom EXAM: CT THORACIC SPINE WITHOUT CONTRAST TECHNIQUE: Multidetector CT images of the thoracic were obtained using the standard protocol without intravenous contrast. COMPARISON:  Chest x-ray 08/07/2018 FINDINGS: Alignment: Kyphosis of the spine. No subluxation. Facet alignment within normal limits. Vertebrae: Age indeterminate mild superior endplate deformity at T4. Age indeterminate mild compression at T5 with less than 20% loss of height of the anterior vertebral body. Paraspinal and other soft tissues: No significant paravertebral or paraspinal soft tissue abnormality. Aortic atherosclerosis. 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 Lung fields demonstrate scarring at the apices. Mild bronchiectasis in the right middle lobe. Disc levels: Multiple level degenerative osteophytes anteriorly. Multiple old right posterior rib fractures. IMPRESSION: 1. Age indeterminate mild superior endplate deformity at T4 and mild age indeterminate compression deformity T5 but new since 2017 thoracic radiographs. MRI would be more sensitive for detection of marrow edema and assessing for fracture acuity. Negative for retropulsion or bony canal compromise. Electronically Signed   By: Donavan Foil M.D.   On: 08/07/2018 23:22   Dg Hip Unilat  With Pelvis 2-3 Views Right  Result Date: 08/07/2018 CLINICAL DATA:  Fall with hip pain EXAM: DG HIP (WITH OR WITHOUT PELVIS) 2-3V RIGHT COMPARISON:  None. FINDINGS: SI joints are non widened.  Pubic symphysis and rami are intact. Acute comminuted intertrochanteric fracture with medially displaced lesser trochanteric fracture fragment and mild cephalad displacement of the distal femur. Right  femoral head projects in joint. IMPRESSION: Acute comminuted and displaced right femoral intertrochanteric fracture Electronically Signed   By: Donavan Foil M.D.   On: 08/07/2018 21:31    EKG: Independently reviewed.  Assessment/Plan Principal Problem:   Closed right hip fracture, initial encounter (HCC) Active Problems:   Anxiety    1. R hip fx - 1. Hip fx pathway 2. NPO 3. IVF: NS at 60 cc/hr 4. Dilaudid 1mg  IV Q2H PRN pain 5. Looks like patient on OR schedule for 1645 2. Anxiety - 1. Continue home meds  DVT prophylaxis: SCDs only pre-op Code Status: Full Family Communication: Daughter at bedside Disposition Plan: CIR vs SNF after admit Consults called: Ortho Admission status: Admit to inpatient  Severity of Illness: The appropriate patient status  for this patient is INPATIENT. Inpatient status is judged to be reasonable and necessary in order to provide the required intensity of service to ensure the patient's safety. The patient's presenting symptoms, physical exam findings, and initial radiographic and laboratory data in the context of their chronic comorbidities is felt to place them at high risk for further clinical deterioration. Furthermore, it is not anticipated that the patient will be medically stable for discharge from the hospital within 2 midnights of admission. The following factors support the patient status of inpatient.   " The patient's presenting symptoms include R hip pain, inability to ambulate or bear weight following fall. " The worrisome physical exam findings include R hip TTP, . " The initial radiographic and laboratory data are worrisome because of R hip IT fx. " The chronic co-morbidities include anxiety.   * I certify that at the point of admission it is my clinical judgment that the patient will require inpatient hospital care spanning beyond 2 midnights from the point of admission due to high intensity of service, high risk for further  deterioration and high frequency of surveillance required.Etta Quill DO [facility] Pager [phone] Only works nights!  If 7AM-7PM, please contact the primary day team physician taking care of patient  [email or web address] Password [email or web address]  08/08/2018, 2:03 AM

## 2018-08-09 ENCOUNTER — Encounter (HOSPITAL_COMMUNITY): Payer: Self-pay | Admitting: Orthopedic Surgery

## 2018-08-09 LAB — CBC
HCT: 26.1 % — ABNORMAL LOW (ref 36.0–46.0)
HEMOGLOBIN: 8.2 g/dL — AB (ref 12.0–15.0)
MCH: 30.7 pg (ref 26.0–34.0)
MCHC: 31.4 g/dL (ref 30.0–36.0)
MCV: 97.8 fL (ref 80.0–100.0)
Platelets: 173 10*3/uL (ref 150–400)
RBC: 2.67 MIL/uL — ABNORMAL LOW (ref 3.87–5.11)
RDW: 12.5 % (ref 11.5–15.5)
WBC: 5.4 10*3/uL (ref 4.0–10.5)
nRBC: 0 % (ref 0.0–0.2)

## 2018-08-09 LAB — BLOOD GAS, ARTERIAL
Acid-Base Excess: 3.5 mmol/L — ABNORMAL HIGH (ref 0.0–2.0)
Bicarbonate: 28.7 mmol/L — ABNORMAL HIGH (ref 20.0–28.0)
Drawn by: 103701
O2 CONTENT: 2 L/min
O2 Saturation: 96.4 %
PO2 ART: 80.6 mmHg — AB (ref 83.0–108.0)
Patient temperature: 99.2
pCO2 arterial: 50.6 mmHg — ABNORMAL HIGH (ref 32.0–48.0)
pH, Arterial: 7.373 (ref 7.350–7.450)

## 2018-08-09 LAB — BASIC METABOLIC PANEL
Anion gap: 5 (ref 5–15)
BUN: 17 mg/dL (ref 8–23)
CO2: 28 mmol/L (ref 22–32)
Calcium: 8.1 mg/dL — ABNORMAL LOW (ref 8.9–10.3)
Chloride: 102 mmol/L (ref 98–111)
Creatinine, Ser: 0.46 mg/dL (ref 0.44–1.00)
GFR calc Af Amer: >60 mL/min (ref >60)
GFR calc non Af Amer: >60 mL/min (ref >60)
Glucose, Bld: 96 mg/dL (ref 70–99)
Potassium: 3.7 mmol/L (ref 3.5–5.1)
Sodium: 135 mmol/L (ref 135–145)

## 2018-08-09 MED ORDER — HYDROCODONE-ACETAMINOPHEN 5-325 MG PO TABS: 0.0000 | ORAL_TABLET | Freq: Four times a day (QID) | ORAL | 0 refills | Status: DC | PRN

## 2018-08-09 MED ORDER — OXYCODONE HCL 5 MG PO TABS
5.0000 mg | ORAL_TABLET | ORAL | Status: DC | PRN
  Filled 2018-08-09: qty 1

## 2018-08-09 MED ORDER — LIDOCAINE 5 % EX PTCH
1.0000 | MEDICATED_PATCH | CUTANEOUS | Status: DC
  Administered 2018-08-09 – 2018-08-10 (×2): 1 via TRANSDERMAL
  Filled 2018-08-09 (×3): qty 1

## 2018-08-09 MED ORDER — OXYCODONE HCL 5 MG PO TABS: 0.0000 mg | ORAL_TABLET | Freq: Four times a day (QID) | ORAL | 0 refills | Status: AC | PRN

## 2018-08-09 MED ORDER — OXYCODONE HCL 5 MG PO TABS
5.0000 mg | ORAL_TABLET | ORAL | Status: DC | PRN
  Administered 2018-08-09 – 2018-08-10 (×3): 5 mg via ORAL
  Filled 2018-08-09 (×4): qty 1

## 2018-08-09 MED ORDER — HYDROMORPHONE HCL 1 MG/ML IJ SOLN: 0.5000 mg | Freq: Four times a day (QID) | INTRAMUSCULAR | Status: DC | PRN

## 2018-08-09 MED ORDER — HYDROMORPHONE HCL 1 MG/ML IJ SOLN: 0.5000 mg | Freq: Three times a day (TID) | INTRAMUSCULAR | Status: DC | PRN

## 2018-08-09 MED ORDER — SENNOSIDES-DOCUSATE SODIUM 8.6-50 MG PO TABS
2.0000 | ORAL_TABLET | Freq: Two times a day (BID) | ORAL | Status: DC
  Administered 2018-08-09 – 2018-08-11 (×5): 2 via ORAL
  Filled 2018-08-09 (×5): qty 2

## 2018-08-09 MED ORDER — LORATADINE 10 MG PO TABS
10.0000 mg | ORAL_TABLET | Freq: Every day | ORAL | Status: DC
  Administered 2018-08-09 – 2018-08-11 (×3): 10 mg via ORAL
  Filled 2018-08-09 (×3): qty 1

## 2018-08-09 MED ORDER — ACETAMINOPHEN 325 MG PO TABS: 650.0000 mg | ORAL_TABLET | Freq: Four times a day (QID) | ORAL | 0 refills | Status: AC | PRN

## 2018-08-09 MED ORDER — ASPIRIN EC 325 MG PO TBEC
325.0000 mg | DELAYED_RELEASE_TABLET | Freq: Two times a day (BID) | ORAL | Status: DC
  Administered 2018-08-09 – 2018-08-11 (×4): 325 mg via ORAL
  Filled 2018-08-09 (×4): qty 1

## 2018-08-09 MED ORDER — DULOXETINE HCL 20 MG PO CPEP
20.0000 mg | ORAL_CAPSULE | Freq: Every day | ORAL | Status: DC
  Administered 2018-08-09 – 2018-08-11 (×3): 20 mg via ORAL
  Filled 2018-08-09 (×3): qty 1

## 2018-08-09 NOTE — Progress Notes (Signed)
PROGRESS NOTE  Mackenzie Blanchard MPN:[id number] DOB: [birth year] DOA: 08/07/2018 PCP: Lajean Manes, MD  HPI/Recap of past 24 hours: Mackenzie Blanchard is an 78 y.o. female past medical history significant for anxiety presents to the ED with severe right hip pain after a mechanical fall in the bathroom today.  [facility] was [facility] and recommended ORIF on 08/08/2018.  POD #1 post right hip repair.  08/09/2018: Patient seen and examined with her daughter at her bedside.  No acute events overnight.  She reports severe pain at site of surgery.  IV pain management in place.  Started p.o. oxycodone as needed for moderate pain and bowel regimen.  Per daughter at bedside patient was to resume Cymbalta and DC Remeron per her psychiatrist.   Assessment/Plan: Principal Problem:   Closed right hip fracture, initial encounter [facility]) Active Problems:   Anxiety  Closed right hip fracture, initial encounter ([place]) POD #1 post surgical repair Continue pain management with IV Dilaudid for severe pain and p.o. oxycodone for moderate pain Robaxin as needed Start bowel regimen with Senokot 2 tablets twice daily PT as recommended by orthopedic surgery Fall precautions CSW consulted for placement  DVT prophylaxis with aspirin 325 mg twice daily x1 month and SCDs in the hospital. Weightbearing as tolerated, recommended by orthopedic surgery Follow-up with orthopedic surgery in 2 weeks from 08/08/2018  Normocytic anemia/acute blood loss anemia Hemoglobin dropped from 12.2 at baseline to 8.2 on 08/09/2018 No sign of overt bleeding Repeat H&H to rule out lab error  QTC prolongation Independently reviewed twelve-lead EKG done on 08/08/2018 which revealed prolonged QTC 522 Repeat twelve-lead EKG today Avoid QTC prolonging agents  General anxiety disorder : Per her daughter at bedside was scheduled to restart Cymbalta and stop Remeron Start Cymbalta 20 mg daily Hold off Remeron Follow-up with your psychiatrist  outpatient  DVT prophylaxis: SCD's Family Communication:daughter at bedside.  All questions answered to her satisfaction. Disposition Plan/Barrier to D/C:  SNF when bed available.     Objective: Vitals:   08/08/18 2157 08/09/18 0212 08/09/18 0507 08/09/18 1000  BP: (!) 117/59 (!) 95/49 135/60 (!) 95/53  Pulse: 86 81 99 81  Resp: 16 10 17 14   Temp: 99 F (37.2 C) 98 F (36.7 C) 99 F (37.2 C) 98.2 F (36.8 C)  TempSrc:  Axillary  Axillary  SpO2: 96% 100% 99% 100%  Weight:      Height:        Intake/Output Summary (Last 24 hours) at 08/09/2018 1051 Last data filed at 08/09/2018 0600 Gross per 24 hour  Intake 2111.07 ml  Output 1150 ml  Net 961.07 ml   Filed Weights   08/08/18 0333 08/08/18 1621  Weight: 53.4 kg 53.4 kg    Exam:  . General: 78 y.o. year-old female well developed well nourished.  Appears uncomfortable due to right hip pain.  Alert and interactive. . Cardiovascular: Regular rate and rhythm with no rubs or gallops.  No thyromegaly or JVD noted.   Marland Kitchen Respiratory: Clear to auscultation with no wheezes or rales. Good inspiratory effort. . Abdomen: Soft nontender nondistended with normal bowel sounds x4 quadrants. . Musculoskeletal: No lower extremity edema. 2/4 pulses in all 4 extremities. Marland Kitchen Psychiatry: Mood is appropriate for condition and setting   Data Reviewed: CBC: Recent Labs  Lab 08/07/18 2209 08/09/18 0446  WBC 8.0 5.4  HGB 12.2 8.2*  HCT 37.9 26.1*  MCV 95.9 97.8  PLT 252 008   Basic Metabolic Panel: Recent Labs  Lab 08/07/18 2209 08/09/18 0446  NA 136 135  K 3.6 3.7  CL 102 102  CO2 24 28  GLUCOSE 105* 96  BUN 14 17  CREATININE 0.48 0.46  CALCIUM 8.7* 8.1*   GFR: Estimated Creatinine Clearance: 48.1 mL/min (by C-G formula based on SCr of 0.46 mg/dL). Liver Function Tests: Recent Labs  Lab 08/07/18 2209  AST 15  ALT 15  ALKPHOS 41  BILITOT 0.4  PROT 5.9*  ALBUMIN 3.8   No results for input(s): LIPASE, AMYLASE in the  last 168 hours. No results for input(s): AMMONIA in the last 168 hours. Coagulation Profile: No results for input(s): INR, PROTIME in the last 168 hours. Cardiac Enzymes: No results for input(s): CKTOTAL, CKMB, CKMBINDEX, TROPONINI in the last 168 hours. BNP (last 3 results) No results for input(s): PROBNP in the last 8760 hours. HbA1C: No results for input(s): HGBA1C in the last 72 hours. CBG: No results for input(s): GLUCAP in the last 168 hours. Lipid Profile: No results for input(s): CHOL, HDL, LDLCALC, TRIG, CHOLHDL, LDLDIRECT in the last 72 hours. Thyroid Function Tests: No results for input(s): TSH, T4TOTAL, FREET4, T3FREE, THYROIDAB in the last 72 hours. Anemia Panel: No results for input(s): VITAMINB12, FOLATE, FERRITIN, TIBC, IRON, RETICCTPCT in the last 72 hours. Urine analysis:    Component Value Date/Time   COLORURINE YELLOW 03/22/2015 1636   APPEARANCEUR TURBID (A) 03/22/2015 1636   LABSPEC 1.008 03/22/2015 1636   PHURINE 7.0 03/22/2015 1636   GLUCOSEU NEGATIVE 03/22/2015 1636   HGBUR MODERATE (A) 03/22/2015 1636   BILIRUBINUR NEGATIVE 03/22/2015 1636   KETONESUR NEGATIVE 03/22/2015 1636   PROTEINUR NEGATIVE 03/22/2015 1636   UROBILINOGEN 0.2 03/22/2015 1636   NITRITE POSITIVE (A) 03/22/2015 1636   LEUKOCYTESUR LARGE (A) 03/22/2015 1636   Sepsis Labs: @LABRCNTIP (procalcitonin:4,lacticidven:4)  ) Recent Results (from the past 240 hour(s))  Surgical pcr screen     Status: None   Collection Time: 08/08/18  4:01 AM  Result Value Ref Range Status   MRSA, PCR NEGATIVE NEGATIVE Final   Staphylococcus aureus NEGATIVE NEGATIVE Final    Comment: (NOTE) The Xpert SA Assay (FDA approved for NASAL specimens in patients 84 years of age and older), is one component of a comprehensive surveillance program. It is not intended to diagnose infection nor to guide or monitor treatment. Performed at [facility], [place] [address], [place], [place]  [address]       Studies: Dg C-arm 1-60 Min-no Report  Result Date: 08/08/2018 Fluoroscopy was utilized by the requesting physician.  No radiographic interpretation.   Dg Hip Operative Unilat W Or W/o Pelvis Right  Result Date: 08/08/2018 CLINICAL DATA:  Intraoperative imaging for fixation of a right intertrochanteric fracture the patient suffered a fall 08/07/2018. Initial encounter. EXAM: OPERATIVE RIGHT HIP (WITH PELVIS IF PERFORMED) 7 VIEWS TECHNIQUE: Fluoroscopic spot image(s) were submitted for interpretation post-operatively. COMPARISON:  Plain films right hip 08/07/2018. FINDINGS: Intraoperative images demonstrate placement of a right hip screw and long intramedullary nail for fixation of an intertrochanteric fracture. Hardware is intact. Position and alignment are markedly improved. No acute abnormality. IMPRESSION: Intraoperative imaging for fixation of a right intertrochanteric fracture. No acute finding. Electronically Signed   By: Inge Rise M.D.   On: 08/08/2018 18:28    Scheduled Meds: . aspirin EC  325 mg Oral Q breakfast  . DULoxetine  20 mg Oral Daily  . levothyroxine  88 mcg Oral Daily  . Melatonin  5 mg Oral QHS  .  senna-docusate  2 tablet Oral BID  . traZODone  75 mg Oral QHS    Continuous Infusions: . sodium chloride 60 mL/hr at 08/09/18 0520     LOS: 1 day     Kayleen Memos, MD [facility] Pager [phone]  If 7PM-7AM, please contact night-coverage [email or web address] Password [email or web address] 08/09/2018, 10:51 AM

## 2018-08-09 NOTE — Progress Notes (Signed)
  PATIENT ID: Mackenzie Blanchard  MRN: [id number]  DOB/AGE:  02-01-1941 / 78 y.o. y.o.  1 Day Post-Op Procedure(s) (LRB): INTRAMEDULLARY (IM) NAIL INTERTROCHANTRIC (Right)  Subjective: Pain is mild, but she is sleeping after having dilaudid.  Daughter said she did well through the night.  No c/o chest pain or SOB.      Objective: Vital signs in last 24 hours: Temp:  [97.9 F (36.6 C)-99 F (37.2 C)] 98.2 F (36.8 C) (01/08 1000) Pulse Rate:  [73-99] 81 (01/08 1000) Resp:  [10-18] 14 (01/08 1000) BP: (95-154)/(49-80) 95/53 (01/08 1000) SpO2:  [92 %-100 %] 100 % (01/08 1000) Weight:  [53.4 kg] 53.4 kg (01/07 1621)  Intake/Output from previous day: 01/07 0701 - 01/08 0700 In: 2111.1 [P.O.:240; I.V.:1821.1; IV Piggyback:50] Out: 1150 [Urine:1100; Blood:50] Intake/Output this shift: No intake/output data recorded.  Recent Labs    08/07/18 2209 08/09/18 0446  HGB 12.2 8.2*   Recent Labs    08/07/18 2209 08/09/18 0446  WBC 8.0 5.4  RBC 3.95 2.67*  HCT 37.9 26.1*  PLT 252 173   Recent Labs    08/07/18 2209 08/09/18 0446  NA 136 135  K 3.6 3.7  CL 102 102  CO2 24 28  BUN 14 17  CREATININE 0.48 0.46  GLUCOSE 105* 96  CALCIUM 8.7* 8.1*   No results for input(s): LABPT, INR in the last 72 hours.  Physical Exam: Sensation intact distally Intact pulses distally Dorsiflexion/Plantar flexion intact Incision: dressing C/D/I Compartment soft  Assessment/Plan: 1 Day Post-Op Procedure(s) (LRB): INTRAMEDULLARY (IM) NAIL INTERTROCHANTRIC (Right)   Up with therapy Discharge to SNF Weight Bearing as Tolerated (WBAT)  VTE prophylaxis: ASA x 1 month, with SCDs while inpatient Regular diet  Kennede Lusk A. Grandville Silos, [place], [place]  [address] Office: [phone] Mobile: [phone]  08/09/2018, 10:59 AM

## 2018-08-09 NOTE — Plan of Care (Signed)

## 2018-08-09 NOTE — Progress Notes (Signed)
Physical Therapy Treatment Patient Details Name: Mackenzie Blanchard MRN: [id number] DOB: 1940-11-25 Today's Date: 08/09/2018    History of Present Illness [name] with R intertrochanteric fx and ORIF repair.      PT Comments    Pt returned from [facility] to bed requiring significant assist of to to perform safely.   Follow Up Recommendations  SNF     Equipment Recommendations  None recommended by PT    Recommendations for Other Services       Precautions / Restrictions Precautions Precautions: Fall Restrictions Weight Bearing Restrictions: No RLE Weight Bearing: Weight bearing as tolerated    Mobility  Bed Mobility Overal bed mobility: Needs Assistance Bed Mobility: Sit to Supine     Supine to sit: Max assist;+2 for physical assistance;+2 for safety/equipment Sit to supine: Max assist;+2 for physical assistance;+2 for safety/equipment   General bed mobility comments: pt assisted  with use of pad.  Minimal assist from pt  Transfers Overall transfer level: Needs assistance Equipment used: Rolling walker (2 wheeled) Transfers: Sit to/from [facility] Sit to Stand: Max assist;+2 physical assistance;+2 safety/equipment Stand pivot transfers: Max assist;+2 physical assistance;+2 safety/equipment       General transfer comment: cues for use of hands to self assist and sequence for stand/pvt with RW bed to chair.  Pt unable/unwilling to achieve erect posture with knees and back both flexed.  Pt minimal WB through RW and with difficulty moving bilat feet  Ambulation/Gait             General Gait Details: stand pvt BSC to bed only   Stairs             Wheelchair Mobility    Modified Rankin (Stroke Patients Only)       Balance Overall balance assessment: Needs assistance Sitting-balance support: Feet supported;No upper extremity supported Sitting balance-Leahy Scale: Poor Sitting balance - Comments: pt falling backward   Standing balance  support: Bilateral upper extremity supported Standing balance-Leahy Scale: Poor                              Cognition Arousal/Alertness: Awake/alert Behavior During Therapy: Anxious Overall Cognitive Status: Within Functional Limits for tasks assessed                                        Exercises      General Comments        Pertinent Vitals/Pain Pain Assessment: Faces Faces Pain Scale: Hurts even more Pain Location: R hip Pain Intervention(s): Limited activity within patient's tolerance;Monitored during session    Home Living Family/patient expects to be discharged to:: Skilled nursing facility                    Prior Function Level of Independence: Needs assistance  Gait / Transfers Assistance Needed: has not been using but aide states she should ADL's / Homemaking Assistance Needed: aide in 6 days a week     PT Goals (current goals can now be found in the care plan section) Acute Rehab PT Goals Patient Stated Goal: Hurt less PT Goal Formulation: With patient Time For Goal Achievement: 08/23/18 Potential to Achieve Goals: Fair Progress towards PT goals: Progressing toward goals    Frequency    Min 3X/week      PT Plan Current plan remains appropriate  Co-evaluation              AM-PAC PT "6 Clicks" Mobility   Outcome Measure  Help needed turning from your back to your side while in a flat bed without using bedrails?: A Lot Help needed moving from lying on your back to sitting on the side of a flat bed without using bedrails?: A Lot Help needed moving to and from a bed to a chair (including a wheelchair)?: A Lot Help needed standing up from a chair using your arms (e.g., wheelchair or bedside chair)?: A Lot Help needed to walk in hospital room?: Total Help needed climbing 3-5 steps with a railing? : Total 6 Click Score: 10    End of Session Equipment Utilized During Treatment: Gait belt Activity  Tolerance: Patient limited by pain Patient left: in bed;with family/visitor present Nurse Communication: Mobility status PT Visit Diagnosis: Muscle weakness (generalized) (M62.81);History of falling (Z91.81);Difficulty in walking, not elsewhere classified (R26.2);Pain;Unsteadiness on feet (R26.81) Pain - Right/Left: Right Pain - part of body: Hip     Time: 4403-4742 PT Time Calculation (min) (ACUTE ONLY): 12 min  Charges:  $Therapeutic Activity: 8-22 mins                     [place] Pager [phone] Office [phone]    Eluterio Seymour 08/09/2018, 4:04 PM

## 2018-08-09 NOTE — Progress Notes (Signed)
Called to see pt with increased sleepiness, arouses with stimulation, able to talk to [place], confused about place, time.  VVS, HR 94 SR, resp 13, BP 139/61.  Dr [place] Crane notified by floor nurse, ABG ordered. Pt has received pain meds, muscle relaxant  and ativan for hx of anxiety during the night . Awaiting ABG results.

## 2018-08-09 NOTE — Evaluation (Signed)
Physical Therapy Evaluation Patient Details Name: Mackenzie Blanchard MRN: [id number] DOB: 02-Sep-1940 Today's Date: 08/09/2018   History of Present Illness  [name] with R intertrochanteric fx and ORIF repair.    Clinical Impression  Pt admitted as above and presenting with functional mobility limitations 2* generalized weakness, pre-existing balance deficits, post op pain, anxiety and decreased R LE strength/ROM.  Pt currently requiring significant assist of 2 to perform all mobility tasks and would benefit from follow up rehab at SNF level to maximize IND and safety.    Follow Up Recommendations SNF    Equipment Recommendations  None recommended by PT    Recommendations for Other Services       Precautions / Restrictions Precautions Precautions: Fall Restrictions Weight Bearing Restrictions: No RLE Weight Bearing: Weight bearing as tolerated      Mobility  Bed Mobility Overal bed mobility: Needs Assistance Bed Mobility: Supine to Sit     Supine to sit: Max assist;+2 for physical assistance;+2 for safety/equipment     General bed mobility comments: pt assisted to EOB with use of pad.  Minimal assist from pt  Transfers Overall transfer level: Needs assistance Equipment used: Rolling walker (2 wheeled) Transfers: Sit to/from [facility] Sit to Stand: Max assist;+2 physical assistance;+2 safety/equipment Stand pivot transfers: Max assist;+2 physical assistance;+2 safety/equipment       General transfer comment: cues for use of hands to self assist and sequence for stand/pvt with RW bed to chair.  Pt unable/unwilling to achieve erect posture with knees and back both flexed.  Pt minimal WB through RW and with difficulty moving bilat feet  Ambulation/Gait             General Gait Details: stand pvt bed to chair only  Stairs            Wheelchair Mobility    Modified Rankin (Stroke Patients Only)       Balance Overall balance  assessment: Needs assistance Sitting-balance support: Feet supported;No upper extremity supported Sitting balance-Leahy Scale: Poor Sitting balance - Comments: pt falling backward   Standing balance support: Bilateral upper extremity supported Standing balance-Leahy Scale: Poor                               Pertinent Vitals/Pain Pain Assessment: Faces Faces Pain Scale: Hurts even more Pain Location: R hip Pain Intervention(s): Limited activity within patient's tolerance;Monitored during session    Home Living Family/patient expects to be discharged to:: Skilled nursing facility                      Prior Function Level of Independence: Needs assistance   Gait / Transfers Assistance Needed: has not been using but aide states she should  ADL's / Homemaking Assistance Needed: aide in 6 days a week        Hand Dominance   Dominant Hand: Right    Extremity/Trunk Assessment   Upper Extremity Assessment Upper Extremity Assessment: Generalized weakness    Lower Extremity Assessment Lower Extremity Assessment: Generalized weakness;RLE deficits/detail    Cervical / Trunk Assessment Cervical / Trunk Assessment: Kyphotic  Communication   Communication: No difficulties  Cognition Arousal/Alertness: Awake/alert Behavior During Therapy: Anxious Overall Cognitive Status: Within Functional Limits for tasks assessed  General Comments      Exercises     Assessment/Plan    PT Assessment Patient needs continued PT services  PT Problem List Decreased strength;Decreased range of motion;Decreased activity tolerance;Decreased balance;Decreased mobility;Decreased knowledge of use of DME;Pain       PT Treatment Interventions DME instruction;Gait training;Functional mobility training;Therapeutic activities;Therapeutic exercise;Balance training;Patient/family education    PT Goals (Current goals can be  found in the Care Plan section)  Acute Rehab PT Goals Patient Stated Goal: Hurt less PT Goal Formulation: With patient Time For Goal Achievement: 08/23/18 Potential to Achieve Goals: Fair    Frequency Min 3X/week   Barriers to discharge        Co-evaluation               AM-PAC PT "6 Clicks" Mobility  Outcome Measure Help needed turning from your back to your side while in a flat bed without using bedrails?: A Lot Help needed moving from lying on your back to sitting on the side of a flat bed without using bedrails?: A Lot Help needed moving to and from a bed to a chair (including a wheelchair)?: A Lot Help needed standing up from a chair using your arms (e.g., wheelchair or bedside chair)?: A Lot Help needed to walk in hospital room?: Total Help needed climbing 3-5 steps with a railing? : Total 6 Click Score: 10    End of Session Equipment Utilized During Treatment: Gait belt Activity Tolerance: Patient limited by pain Patient left: Other (comment);with family/visitor present(BSC) Nurse Communication: Mobility status PT Visit Diagnosis: Muscle weakness (generalized) (M62.81);History of falling (Z91.81);Difficulty in walking, not elsewhere classified (R26.2);Pain;Unsteadiness on feet (R26.81) Pain - Right/Left: Right Pain - part of body: Hip    Time: 1761-6073 PT Time Calculation (min) (ACUTE ONLY): 18 min   Charges:   PT Evaluation $PT Eval Low Complexity: 1 Low          [place] Pager [phone] Office [phone]   Sumayyah Custodio 08/09/2018, 3:57 PM

## 2018-08-10 LAB — BASIC METABOLIC PANEL
Anion gap: 6 (ref 5–15)
BUN: 11 mg/dL (ref 8–23)
CO2: 29 mmol/L (ref 22–32)
Calcium: 8.1 mg/dL — ABNORMAL LOW (ref 8.9–10.3)
Chloride: 102 mmol/L (ref 98–111)
Creatinine, Ser: 0.42 mg/dL — ABNORMAL LOW (ref 0.44–1.00)
GFR calc Af Amer: >60 mL/min (ref >60)
Glucose, Bld: 115 mg/dL — ABNORMAL HIGH (ref 70–99)
Potassium: 3.5 mmol/L (ref 3.5–5.1)
Sodium: 137 mmol/L (ref 135–145)

## 2018-08-10 LAB — CBC
HCT: 23.5 % — ABNORMAL LOW (ref 36.0–46.0)
Hemoglobin: 7.4 g/dL — ABNORMAL LOW (ref 12.0–15.0)
MCH: 30.1 pg (ref 26.0–34.0)
MCHC: 31.5 g/dL (ref 30.0–36.0)
MCV: 95.5 fL (ref 80.0–100.0)
Platelets: 167 10*3/uL (ref 150–400)
RBC: 2.46 MIL/uL — ABNORMAL LOW (ref 3.87–5.11)
RDW: 12 % (ref 11.5–15.5)
WBC: 5.1 10*3/uL (ref 4.0–10.5)
nRBC: 0 % (ref 0.0–0.2)

## 2018-08-10 LAB — IRON AND TIBC
Iron: 6 ug/dL — ABNORMAL LOW (ref 28–170)
Saturation Ratios: 3 % — ABNORMAL LOW (ref 10.4–31.8)
TIBC: 197 ug/dL — ABNORMAL LOW (ref 250–450)
UIBC: 191 ug/dL

## 2018-08-10 LAB — PREPARE RBC (CROSSMATCH)

## 2018-08-10 LAB — HEMOGLOBIN AND HEMATOCRIT, BLOOD
HCT: 22.9 % — ABNORMAL LOW (ref 36.0–46.0)
Hemoglobin: 7.5 g/dL — ABNORMAL LOW (ref 12.0–15.0)

## 2018-08-10 LAB — FERRITIN: Ferritin: 70 ng/mL (ref 11–307)

## 2018-08-10 MED ORDER — LIDOCAINE 5 % EX PTCH: 1.0000 | MEDICATED_PATCH | CUTANEOUS | 0 refills | Status: AC

## 2018-08-10 MED ORDER — METHOCARBAMOL 500 MG PO TABS: 500.0000 mg | ORAL_TABLET | Freq: Four times a day (QID) | ORAL | 0 refills | Status: AC | PRN

## 2018-08-10 MED ORDER — SODIUM CHLORIDE 0.9% IV SOLUTION
Freq: Once | INTRAVENOUS | Status: AC
  Administered 2018-08-10: 14:00:00 via INTRAVENOUS

## 2018-08-10 MED ORDER — DULOXETINE HCL 20 MG PO CPEP: 20.0000 mg | ORAL_CAPSULE | Freq: Every day | ORAL | 0 refills | Status: AC

## 2018-08-10 MED ORDER — ACETAMINOPHEN 325 MG PO TABS
650.0000 mg | ORAL_TABLET | Freq: Four times a day (QID) | ORAL | Status: DC | PRN
  Administered 2018-08-10 – 2018-08-11 (×2): 650 mg via ORAL
  Filled 2018-08-10 (×2): qty 2

## 2018-08-10 NOTE — Progress Notes (Signed)
Patient accepted to [facility].  Ship broker Process started.   Kathrin Greathouse, Marlinda Mike, MSW Clinical Social Worker  [phone] 08/10/2018  12:55 PM

## 2018-08-10 NOTE — Discharge Summary (Signed)
Discharge Summary  Mackenzie Blanchard KKX:[id number] DOB: 1940/11/02  PCP: Lajean Manes, MD  Admit date: 08/07/2018 Discharge date: 08/10/2018  Time spent: 35 minutes  Recommendations for Outpatient Follow-up:  1. Follow-up with your PCP 2. Follow-up with orthopedic surgery 3. Continue physical therapy 4. Fall precautions  5. Take your medications as prescribed  Discharge Diagnoses:  Active Hospital Problems   Diagnosis Date Noted  . Closed right hip fracture, initial encounter ([place]) 08/08/2018  . Anxiety 03/22/2015    Resolved Hospital Problems  No resolved problems to display.    Discharge Condition: Stable  Diet recommendation: Resume previous diet  Vitals:   08/09/18 2136 08/10/18 0524  BP: (!) 108/59 112/62  Pulse: 92 92  Resp: 16 12  Temp: 98.2 F (36.8 C) 98.5 F (36.9 C)  SpO2: 99% 99%    History of present illness:   Mackenzie Blanchard an 78 y.o.femalepast medical history significant for anxiety presents to the ED with severe right hip pain after a mechanical fall in the bathroom today. [facility] was consulted and recommended ORIF on 08/08/2018.  POD #1 post right hip repair.  08/09/2018: Patient seen and examined with her daughter at her bedside.  No acute events overnight.  She reports severe pain at site of surgery.  IV pain management in place.  Started p.o. oxycodone as needed for moderate pain and bowel regimen.  Per daughter at bedside patient was to resume Cymbalta and DC Remeron per her psychiatrist.   08/10/2018: Patient seen and examined at her bedside.  No acute events overnight.  Admits to generalized weakness.  Drop in hemoglobin from 8.2 to 7.4.  Repeated H&H early this morning to rule out lab error but was canceled.  No sign of overt bleeding.  Will transfuse 1 unit PRBCs.  Hospital Course:  Principal Problem:   Closed right hip fracture, initial encounter [facility]) Active Problems:   Anxiety  Closed right hip fracture, initial encounter  ([place]) POD #2 post surgical repair Continue pain management with IV Dilaudid for severe pain and p.o. oxycodone for moderate pain Robaxin as needed Continue bowel regimen with Senokot 2 tablets twice daily PT as recommended by orthopedic surgery Fall precautions CSW consulted for placement  DVT prophylaxis with aspirin 325 mg twice daily x1 month and SCDs in the hospital. Weightbearing as tolerated, recommended by orthopedic surgery Follow-up with orthopedic surgery in 2 weeks from 08/08/2018  Normocytic anemia/acute blood loss anemia Hemoglobin dropped from 12.2 at baseline to 8.2 on 08/09/2018 and again from 8.2 to 7.5 today No sign of overt bleeding Repeated H&H to rule out lab error earlier this morning but was canceled Get H&H and transfuse 1 unit PRBC Repeat CBC in the morning  QTC prolongation Independently reviewed twelve-lead EKG done on 08/08/2018 which revealed prolonged QTC 522 Repeat twelve-lead EKG  Avoid QTC prolonging agents  General anxiety disorder: Per her daughter at bedside was scheduled to restart Cymbalta and stop Remeron Continue Cymbalta 20 mg daily Hold off Remeron Follow-up with your psychiatrist outpatient  DVT prophylaxis:SCD's Family Communication:daughter at bedside.  All questions answered to her satisfaction. Disposition Plan/Barrier to D/C: SNF when bed available.     Procedures:  Right hip repair  Consultations:  Orthopedic surgery  Discharge Exam: BP 112/62 (BP Location: Right Arm)   Pulse 92   Temp 98.5 F (36.9 C) (Axillary)   Resp 12   Ht 5\' 6"  (1.676 m)   Wt 53.4 kg   SpO2 99%   BMI  19.00 kg/m  . General: 78 y.o. year-old female well developed well nourished in no acute distress.  Alert and oriented x3. . Cardiovascular: Regular rate and rhythm with no rubs or gallops.  No thyromegaly or JVD noted.   Marland Kitchen Respiratory: Clear to auscultation with no wheezes or rales. Good inspiratory effort. . Abdomen: Soft nontender  nondistended with normal bowel sounds x4 quadrants. . Psychiatry: Mood is appropriate for condition and setting  Discharge Instructions You were cared for by a hospitalist during your hospital stay. If you have any questions about your discharge medications or the care you received while you were in the hospital after you are discharged, you can call the unit and asked to speak with the hospitalist on call if the hospitalist that took care of you is not available. Once you are discharged, your primary care physician will handle any further medical issues. Please note that NO REFILLS for any discharge medications will be authorized once you are discharged, as it is imperative that you return to your primary care physician (or establish a relationship with a primary care physician if you do not have one) for your aftercare needs so that they can reassess your need for medications and monitor your lab values.  Discharge Instructions    Weight bearing as tolerated   Complete by:  As directed      Allergies as of 08/10/2018      Reactions   Codeine Other (See Comments)   Hallucinations   Penicillins Rash   DID THE REACTION INVOLVE: Swelling of the face/tongue/throat, SOB, or low BP? Yes Sudden or severe rash/hives, skin peeling, or the inside of the mouth or nose? No Did it require medical treatment? No When did it last happen?30 yrs ago If all above answers are "NO", may proceed with cephalosporin use.      Medication List    STOP taking these medications   loperamide 2 MG capsule Commonly known as:  IMODIUM   mirtazapine 45 MG disintegrating tablet Commonly known as:  REMERON SOL-TAB   Potassium 99 MG Tabs     TAKE these medications   acetaminophen 325 MG tablet Commonly known as:  TYLENOL Take 2 tablets (650 mg total) by mouth every 6 (six) hours as needed for mild pain or moderate pain. What changed:    when to take this  reasons to take this   aspirin EC 325 MG  tablet Take 1 tablet (325 mg total) by mouth 2 (two) times daily.   cholecalciferol 1000 units tablet Commonly known as:  VITAMIN D Take 1,000 Units by mouth 2 (two) times daily.   Co Q10 100 MG Caps Take 400 mg by mouth daily.   diphenhydrAMINE 25 MG tablet Commonly known as:  BENADRYL Take 25 mg by mouth at bedtime as needed for itching or sleep.   DULoxetine 20 MG capsule Commonly known as:  CYMBALTA Take 1 capsule (20 mg total) by mouth daily. Start taking on:  August 11, 2018   levothyroxine 88 MCG tablet Commonly known as:  SYNTHROID, LEVOTHROID Take 88 mcg by mouth daily.   lidocaine 5 % Commonly known as:  LIDODERM Place 1 patch onto the skin daily. Remove & Discard patch within 12 hours or as directed by MD   Lutein 20 MG Caps Take 40 mg by mouth daily.   Melatonin 5 MG Caps Take 15 mg by mouth at bedtime.   methocarbamol 500 MG tablet Commonly known as:  ROBAXIN Take 1 tablet (500  mg total) by mouth every 6 (six) hours as needed for muscle spasms.   multivitamin tablet Take 1 tablet by mouth daily.   oxyCODONE 5 MG immediate release tablet Commonly known as:  Oxy IR/ROXICODONE Take 0-1 tablets (0-5 mg total) by mouth every 6 (six) hours as needed (severe postop pain).   traZODone 50 MG tablet Commonly known as:  DESYREL Take 75 mg by mouth at bedtime.            Discharge Care Instructions  (From admission, onward)         Start     Ordered   08/08/18 0000  Weight bearing as tolerated     08/08/18 1805         Allergies  Allergen Reactions  . Codeine Other (See Comments)    Hallucinations   . Penicillins Rash    DID THE REACTION INVOLVE: Swelling of the face/tongue/throat, SOB, or low BP? Yes Sudden or severe rash/hives, skin peeling, or the inside of the mouth or nose? No Did it require medical treatment? No When did it last happen?30 yrs ago If all above answers are "NO", may proceed with cephalosporin use.     Contact  information for follow-up providers    Schedule an appointment as soon as possible for a visit with Milly Jakob, MD.   Specialty:  Orthopedic Surgery Why:  for approx. 2 weeks following surgery Contact information: [place] [address] [phone]        Lajean Manes, MD. Call in 1 day(s).   Specialty:  Internal Medicine Why:  Please call for a post hospital follow-up appointment. Contact information: [address] [place] [address] [phone]            Contact information for after-discharge care    Destination    [facility] Preferred SNF .   Service:  Skilled Nursing Contact information: [address] [place] [phone]                   The results of significant diagnostics from this hospitalization (including imaging, microbiology, ancillary and laboratory) are listed below for reference.    Significant Diagnostic Studies: Dg Chest 2 View  Result Date: 08/07/2018 CLINICAL DATA:  Mechanical fall EXAM: CHEST - 2 VIEW COMPARISON:  11/21/2015, 11/17/2015 FINDINGS: No large pleural effusion. No acute airspace disease or pneumothorax. Stable cardiomediastinal silhouette with mildly tortuous aorta. Old right fourth fifth and sixth rib fractures. Age indeterminate mild compression deformities of upper thoracic vertebra. IMPRESSION: 1. Negative for pleural effusion or pneumothorax 2. Age indeterminate mild compression deformities of 2 adjacent upper thoracic vertebra Electronically Signed   By: Donavan Foil M.D.   On: 08/07/2018 21:30   Ct Head Wo Contrast  Result Date: 08/07/2018 CLINICAL DATA:  Head trauma EXAM: CT HEAD WITHOUT CONTRAST TECHNIQUE: Contiguous axial images were obtained from the base of the skull through the vertex without intravenous contrast. COMPARISON:  Head CT 829 2016 FINDINGS: Brain: No acute territorial infarction, hemorrhage, or intracranial mass. Mild atrophy. Mild  small vessel ischemic changes of the white matter. Vascular: No hyperdense vessels. Scattered calcifications at the carotid siphons. Skull: No fracture Sinuses/Orbits: No acute finding. Other: None IMPRESSION: 1. No CT evidence for acute intracranial abnormality. 2. Atrophy and mild small vessel ischemic changes of the white matter Electronically Signed   By: Donavan Foil M.D.   On: 08/07/2018 23:13   Ct Thoracic Spine Wo Contrast  Result  Date: 08/07/2018 CLINICAL DATA:  [place] in bathroom EXAM: CT THORACIC SPINE WITHOUT CONTRAST TECHNIQUE: Multidetector CT images of the thoracic were obtained using the standard protocol without intravenous contrast. COMPARISON:  Chest x-ray 08/07/2018 FINDINGS: Alignment: Kyphosis of the spine. No subluxation. Facet alignment within normal limits. Vertebrae: Age indeterminate mild superior endplate deformity at T4. Age indeterminate mild compression at T5 with less than 20% loss of height of the anterior vertebral body. Paraspinal and other soft tissues: No significant paravertebral or paraspinal soft tissue abnormality. Aortic atherosclerosis. 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 Lung fields demonstrate scarring at the apices. Mild bronchiectasis in the right middle lobe. Disc levels: Multiple level degenerative osteophytes anteriorly. Multiple old right posterior rib fractures. IMPRESSION: 1. Age indeterminate mild superior endplate deformity at T4 and mild age indeterminate compression deformity T5 but new since 2017 thoracic radiographs. MRI would be more sensitive for detection of marrow edema and assessing for fracture acuity. Negative for retropulsion or bony canal compromise. Electronically Signed   By: Donavan Foil M.D.   On: 08/07/2018 23:22   Dg C-arm 1-60 Min-no Report  Result Date: 08/08/2018 Fluoroscopy was utilized by the requesting physician.  No radiographic interpretation.   Dg Hip Operative Unilat W Or W/o Pelvis Right  Result Date: 08/08/2018 CLINICAL  DATA:  Intraoperative imaging for fixation of a right intertrochanteric fracture the patient suffered a fall 08/07/2018. Initial encounter. EXAM: OPERATIVE RIGHT HIP (WITH PELVIS IF PERFORMED) 7 VIEWS TECHNIQUE: Fluoroscopic spot image(s) were submitted for interpretation post-operatively. COMPARISON:  Plain films right hip 08/07/2018. FINDINGS: Intraoperative images demonstrate placement of a right hip screw and long intramedullary nail for fixation of an intertrochanteric fracture. Hardware is intact. Position and alignment are markedly improved. No acute abnormality. IMPRESSION: Intraoperative imaging for fixation of a right intertrochanteric fracture. No acute finding. Electronically Signed   By: Inge Rise M.D.   On: 08/08/2018 18:28   Dg Hip Unilat  With Pelvis 2-3 Views Right  Result Date: 08/07/2018 CLINICAL DATA:  Fall with hip pain EXAM: DG HIP (WITH OR WITHOUT PELVIS) 2-3V RIGHT COMPARISON:  None. FINDINGS: SI joints are non widened.  Pubic symphysis and rami are intact. Acute comminuted intertrochanteric fracture with medially displaced lesser trochanteric fracture fragment and mild cephalad displacement of the distal femur. Right femoral head projects in joint. IMPRESSION: Acute comminuted and displaced right femoral intertrochanteric fracture Electronically Signed   By: Donavan Foil M.D.   On: 08/07/2018 21:31    Microbiology: Recent Results (from the past 240 hour(s))  Surgical pcr screen     Status: None   Collection Time: 08/08/18  4:01 AM  Result Value Ref Range Status   MRSA, PCR NEGATIVE NEGATIVE Final   Staphylococcus aureus NEGATIVE NEGATIVE Final    Comment: (NOTE) The Xpert SA Assay (FDA approved for NASAL specimens in patients 22 years of age and older), is one component of a comprehensive surveillance program. It is not intended to diagnose infection nor to guide or monitor treatment. Performed at [facility], [place] [address] [place],  [place] [address]      Labs: Basic Metabolic Panel: Recent Labs  Lab 08/07/18 2209 08/09/18 0446 08/10/18 0519  NA 136 135 137  K 3.6 3.7 3.5  CL 102 102 102  CO2 24 28 29   GLUCOSE 105* 96 115*  BUN 14 17 11   CREATININE 0.48 0.46 0.42*  CALCIUM 8.7* 8.1* 8.1*   Liver Function Tests: Recent Labs  Lab 08/07/18 2209  AST 15  ALT 15  ALKPHOS 41  BILITOT 0.4  PROT 5.9*  ALBUMIN 3.8   No results for input(s): LIPASE, AMYLASE in the last 168 hours. No results for input(s): AMMONIA in the last 168 hours. CBC: Recent Labs  Lab 08/07/18 2209 08/09/18 0446 08/10/18 0519  WBC 8.0 5.4 5.1  HGB 12.2 8.2* 7.4*  HCT 37.9 26.1* 23.5*  MCV 95.9 97.8 95.5  PLT 252 173 167   Cardiac Enzymes: No results for input(s): CKTOTAL, CKMB, CKMBINDEX, TROPONINI in the last 168 hours. BNP: BNP (last 3 results) No results for input(s): BNP in the last 8760 hours.  ProBNP (last 3 results) No results for input(s): PROBNP in the last 8760 hours.  CBG: No results for input(s): GLUCAP in the last 168 hours.     Signed:  Kayleen Memos, MD [facility] 08/10/2018, 12:49 PM

## 2018-08-10 NOTE — NC FL2 (Signed)
[place] LEVEL OF CARE SCREENING TOOL     IDENTIFICATION  Patient Name: Mackenzie Blanchard Birthdate: 01/10/1941 Sex: female Admission Date (Current Location): 08/07/2018  [facility] and [place] Number:  Herbalist and Address:  [facility],  [place], [place]      Provider Number: [id number]  Attending Physician Name and Address:  Kayleen Memos, DO  Relative Name and Phone Number:       Current Level of Care: Hospital Recommended Level of Care: Accident Prior Approval Number:    Date Approved/Denied:   PASRR Number: [id number]  Discharge Plan: SNF    Current Diagnoses: Patient Active Problem List   Diagnosis Date Noted  . Closed right hip fracture, initial encounter ([place]) 08/08/2018  . Fall 11/18/2015  . Multiple fractures of ribs of right side 11/18/2015  . Traumatic pneumothorax 11/18/2015  . Muscle spasms of neck 03/23/2015  . Acute encephalopathy 03/22/2015  . UTI (lower urinary tract infection) 03/22/2015  . Anxiety 03/22/2015  . Hyponatremia 03/22/2015  . Gait abnormality 05/11/2012  . Weakness 05/11/2012    Orientation RESPIRATION BLADDER Height & Weight     Self, Time, Situation, Place  Normal Continent Weight: 117 lb 11.6 oz (53.4 kg) Height:  5\' 6"  (167.6 cm)  BEHAVIORAL SYMPTOMS/MOOD NEUROLOGICAL BOWEL NUTRITION STATUS      Continent Diet(Regular)  AMBULATORY STATUS COMMUNICATION OF NEEDS Skin   Extensive Assist Verbally Surgical wounds                       Personal Care Assistance Level of Assistance  Bathing, Feeding, Dressing Bathing Assistance: Maximum assistance Feeding assistance: Independent Dressing Assistance: Maximum assistance     Functional Limitations Info  Sight, Hearing, Speech Sight Info: Impaired Hearing Info: Adequate Speech Info: Adequate    SPECIAL CARE FACTORS FREQUENCY  PT (By licensed PT), OT (By licensed OT)     PT Frequency: 5x/week OT  Frequency: 5x/week            Contractures Contractures Info: Not present    Additional Factors Info  Code Status, Allergies, Psychotropic Code Status Info: Fullcode  Allergies Info: Allergies: Codeine, Penicillins Psychotropic Info: Cymbalta          Current Medications (08/10/2018):  This is the current hospital active medication list Current Facility-Administered Medications  Medication Dose Route Frequency Provider Last Rate Last Dose  . 0.9 %  sodium chloride infusion   Intravenous Continuous Milly Jakob, MD 60 mL/hr at 08/10/18 0600    . aspirin EC tablet 325 mg  325 mg Oral BID Kayleen Memos, DO   325 mg at 08/09/18 2133  . DULoxetine (CYMBALTA) DR capsule 20 mg  20 mg Oral Daily Irene Pap N, DO   20 mg at 08/09/18 1104  . levothyroxine (SYNTHROID, LEVOTHROID) tablet 88 mcg  88 mcg Oral Daily Milly Jakob, MD   88 mcg at 08/10/18 0502  . lidocaine (LIDODERM) 5 % 1 patch  1 patch Transdermal Q24H Irene Pap N, DO   1 patch at 08/09/18 1723  . loratadine (CLARITIN) tablet 10 mg  10 mg Oral Daily Irene Pap N, DO   10 mg at 08/09/18 2046  . LORazepam (ATIVAN) tablet 0.5 mg  0.5 mg Oral Q12H PRN Milly Jakob, MD   0.5 mg at 08/09/18 0506  . Melatonin TABS 5 mg  5 mg Oral QHS Milly Jakob, MD   5 mg at 08/09/18 2134  .  menthol-cetylpyridinium (CEPACOL) lozenge 3 mg  1 lozenge Oral PRN Milly Jakob, MD       Or  . phenol (CHLORASEPTIC) mouth spray 1 spray  1 spray Mouth/Throat PRN Milly Jakob, MD      . methocarbamol (ROBAXIN) tablet 500 mg  500 mg Oral Q6H PRN Milly Jakob, MD   500 mg at 08/10/18 0502  . metoCLOPramide (REGLAN) tablet 5-10 mg  5-10 mg Oral Q8H PRN Milly Jakob, MD       Or  . metoCLOPramide (REGLAN) injection 5-10 mg  5-10 mg Intravenous Q8H PRN Milly Jakob, MD      . ondansetron [facility]) injection 4 mg  4 mg Intravenous Q6H PRN Milly Jakob, MD   4 mg at 08/08/18 0258  . oxyCODONE (Oxy IR/ROXICODONE) immediate release  tablet 5 mg  5 mg Oral Q4H PRN Irene Pap N, DO   5 mg at 08/10/18 0503  . senna-docusate (Senokot-S) tablet 2 tablet  2 tablet Oral BID Kayleen Memos, DO   2 tablet at 08/09/18 2133     Discharge Medications: Please see discharge summary for a list of discharge medications.  Relevant Imaging Results:  Relevant Lab Results:   Additional Information ALP:[id number]  Lia Hopping, LCSW

## 2018-08-10 NOTE — Discharge Instructions (Signed)
Hip Fracture  A hip fracture is a break in the upper part of the thigh bone (femur). This is usually the result of an injury, commonly a fall. What are the causes? This condition may be caused by:  A direct hit or injury (trauma) to the side of the hip, such as from a fall or a car accident. What increases the risk? You are more likely to develop this condition if:  You have poor balance or an unsteady walking pattern (gait). Certain conditions contribute to poor balance, including Parkinson disease and dementia.  You have thinning or weakening of your bones, such as from osteopenia or osteoporosis.  You have cancer that spreads to the leg bones.  You have certain conditions that can weaken your bones, such as thyroid disorders, intestine disorders, or a lack (deficiency) of certain nutrients.  You smoke.  You take certain medicines, such as steroids.  You have a history of broken bones. What are the signs or symptoms? Symptoms of this condition include:  Pain over the injured hip. This is commonly felt on the side of the hip or in the front groin area.  Stiffness, bruising, and swelling over the hip.  Pain with movement of the leg, especially lifting it up. Pain often gets better with rest.  Difficulty or inability to stand, walk, or use the leg to support body weight (put weight on the leg).  The leg rolling outward when lying down.  The affected leg being shorter than the other leg. How is this diagnosed? This condition may be diagnosed based on:  Your symptoms.  A physical exam.  X-rays. These may be done: ? To confirm the diagnosis. ? To determine the type and location of the fracture. ? To check for other injuries.  MRI or CT scans. These may be done if the fracture is not visible on an X-ray. How is this treated? Treatment for this condition depends on the severity and location of your fracture. In most cases, surgery is necessary. Surgery may  involve:  Repairing the fracture with a screw, nail, or rod to hold the bone in place (open reduction and internal fixation, ORIF).  Replacing the damaged parts of the femur with metal implants (hemiarthroplasty or arthroplasty). If your fracture is less severe, or if you are not eligible for surgery, you may have non-surgical treatment. Non-surgical treatment may involve:  Using crutches, a walker, or a wheelchair until your health care provider says that you can support (bear) weight on your hip.  Medicines to help reduce pain and swelling.  Having regular X-rays to monitor your fracture and make sure that it is healing.  Physical therapy. You may need physical therapy after surgery, too. Follow these instructions at home: Activity  Do not use your injured leg to support your body weight until your health care provider says that you can. ? Follow standing and walking restrictions as told by your health care provider. ? Use crutches, a walker, or a wheelchair as directed.  Avoid any activities that cause pain or irritation in your hip. Ask your health care provider what activities are safe for you.  Do not drive or use heavy machinery until your health care provider approves.  If physical therapy was prescribed, do exercises as told by your health care provider. General instructions  Take over-the-counter and prescription medicines only as told by your health care provider.  If directed, put ice on the injured area: ? Put ice in a plastic bag. ?   Place a towel between your skin and the bag. ? Leave the ice on for 20 minutes, 2-3 times a day.  Do not use any products that contain nicotine or tobacco, such as cigarettes and e-cigarettes. These can delay bone healing. If you need help quitting, ask your health care provider.  Keep all follow-up visits as told by your health care provider. This is important. How is this prevented?  To prevent falls at home: ? Use a cane, walker,  or wheelchair as directed. ? Make sure your rooms and hallways are free of clutter, obstacles, and cords. ? Install grab bars in your bedroom and bathrooms. ? Always use handrails when going up and down stairs. ? Use nightlights around the house.  Exercise regularly. Ask what forms of exercise are safe for you, such as walking and strength and balance exercises.  Visit an eye doctor regularly to have your eyesight checked. This can help prevent falls.  Make sure you get enough calcium and vitamin D.  Do not use any products that contain nicotine or tobacco, such as cigarettes and e-cigarettes. If you need help quitting, ask your health care provider.  Limit alcohol use.  If you have an underlying condition that caused your hip fracture, work with your health care provider to manage your condition. Contact a health care provider if:  Your pain gets worse or it does not get better with rest or medicine.  You develop any of the following in your leg or foot: ? Numbness. ? Tingling. ? A change in skin color (discoloration). ? Skin feeling cold to the touch. Get help right away if:  Your pain suddenly gets worse.  You cannot move your hip. Summary  A hip fracture is a break in the upper part of the thigh bone (femur).  Treatment typically require surgical management to restore stability and function to the hip.  Pain medicine and icing of the affected leg can help manage pain and swelling. Follow directions as told by your health care provider. This information is not intended to replace advice given to you by your health care provider. Make sure you discuss any questions you have with your health care provider. Document Released: 07/19/2005 Document Revised: 04/08/2018 Document Reviewed: 08/21/2016 Elsevier Interactive Patient Education  2019 [employer]  

## 2018-08-10 NOTE — Progress Notes (Signed)
  PATIENT ID: Mackenzie Blanchard  MRN: [id number]  DOB/AGE:  Oct 12, 1940 / 78 y.o.  2 Days Post-Op Procedure(s) (LRB): INTRAMEDULLARY (IM) NAIL INTERTROCHANTRIC (Right)  Subjective: Pain is mild, much more awake and interactive today. No c/o chest pain or SOB.  When asked, relates slight dizziness    Objective: Vital signs in last 24 hours: Temp:  [98.2 F (36.8 C)-99.4 F (37.4 C)] 98.5 F (36.9 C) (01/09 0524) Pulse Rate:  [78-103] 92 (01/09 0524) Resp:  [12-20] 12 (01/09 0524) BP: (95-131)/(53-66) 112/62 (01/09 0524) SpO2:  [94 %-100 %] 99 % (01/09 0524)  Intake/Output from previous day: 01/08 0701 - 01/09 0700 In: 2019.9 [P.O.:706; I.V.:1313.9] Out: 1225 [Urine:1225] Intake/Output this shift: Total I/O In: -  Out: 150 [Urine:150]  Recent Labs    08/07/18 2209 08/09/18 0446 08/10/18 0519  HGB 12.2 8.2* 7.4*   Recent Labs    08/09/18 0446 08/10/18 0519  WBC 5.4 5.1  RBC 2.67* 2.46*  HCT 26.1* 23.5*  PLT 173 167   Recent Labs    08/09/18 0446 08/10/18 0519  NA 135 137  K 3.7 3.5  CL 102 102  CO2 28 29  BUN 17 11  CREATININE 0.46 0.42*  GLUCOSE 96 115*  CALCIUM 8.1* 8.1*   No results for input(s): LABPT, INR in the last 72 hours.  Physical Exam: Sensation intact distally Intact pulses distally Dorsiflexion/Plantar flexion intact Incision: dressing C/D/I Compartment soft  Assessment/Plan: 2 Days Post-Op Procedure(s) (LRB): INTRAMEDULLARY (IM) NAIL INTERTROCHANTRIC (Right)   Up with therapy Discharge to SNF Weight Bearing as Tolerated (WBAT)  VTE prophylaxis: ASA x 1 month, with SCDs while inpatient Regular diet  Acute blood-loss anemia: Hgb 7.4 this am, normotensive, HR 92, reports slight dizziness, but just when asked about it. Will defer transfusion decision to Dr. Nevada Crane.  Rayvon Char [address], [place], [place]  [id number] Office:  [phone] Mobile: [phone]  08/10/2018, 8:50 AM

## 2018-08-10 NOTE — Plan of Care (Signed)
  Problem: Health Behavior/Discharge Planning: Goal: Ability to manage health-related needs will improve Outcome: Progressing   Problem: Clinical Measurements: Goal: Ability to maintain clinical measurements within normal limits will improve Outcome: Progressing Goal: Will remain free from infection Outcome: Progressing Goal: Diagnostic test results will improve Outcome: Progressing Goal: Respiratory complications will improve Outcome: Progressing Goal: Cardiovascular complication will be avoided Outcome: Progressing   Problem: Activity: Goal: Risk for activity intolerance will decrease Outcome: Progressing   Problem: Nutrition: Goal: Adequate nutrition will be maintained Outcome: Progressing   Problem: Coping: Goal: Level of anxiety will decrease Outcome: Progressing   Problem: Elimination: Goal: Will not experience complications related to bowel motility Outcome: Progressing Goal: Will not experience complications related to urinary retention Outcome: Progressing   Problem: Pain Managment: Goal: General experience of comfort will improve Outcome: Progressing   Problem: Safety: Goal: Ability to remain free from injury will improve Outcome: Progressing   Problem: Skin Integrity: Goal: Risk for impaired skin integrity will decrease Outcome: Progressing   Problem: Education: Goal: Verbalization of understanding the information provided (i.e., activity precautions, restrictions, etc) will improve Outcome: Progressing Goal: Individualized Educational Video(s) Outcome: Progressing   Problem: Activity: Goal: Ability to ambulate and perform ADLs will improve Outcome: Progressing   Problem: Clinical Measurements: Goal: Postoperative complications will be avoided or minimized Outcome: Progressing   Problem: Self-Concept: Goal: Ability to maintain and perform role responsibilities to the fullest extent possible will improve Outcome: Progressing   Problem: Pain  Management: Goal: Pain level will decrease Outcome: Progressing

## 2018-08-10 NOTE — Care Management Note (Signed)
Case Management Note  Patient Details  Name: Mackenzie Blanchard MRN: [id number] Date of Birth: 1941/01/02  Subjective/Objective:   Plan for d/c to SNF, discharge planning per CSW. [phone]                 Action/Plan:   Expected Discharge Date:  08/12/18               Expected Discharge Plan:  Skilled Nursing Facility  In-House Referral:  Clinical Social Work  Discharge planning Services  CM Consult  Post Acute Care Choice:  NA Choice offered to:  Patient, Adult Children  DME Arranged:  N/A DME Agency:  NA  HH Arranged:  NA HH Agency:  NA  Status of Service:  Completed, signed off  If discussed at [employer] of Stay Meetings, dates discussed:    Additional Comments:  Guadalupe Maple, RN 08/10/2018, 8:31 AM

## 2018-08-11 DIAGNOSIS — E440 Moderate protein-calorie malnutrition: Secondary | ICD-10-CM | POA: Diagnosis not present

## 2018-08-11 DIAGNOSIS — I4581 Long QT syndrome: Secondary | ICD-10-CM | POA: Diagnosis not present

## 2018-08-11 DIAGNOSIS — F331 Major depressive disorder, recurrent, moderate: Secondary | ICD-10-CM | POA: Diagnosis not present

## 2018-08-11 DIAGNOSIS — M1380 Other specified arthritis, unspecified site: Secondary | ICD-10-CM | POA: Diagnosis not present

## 2018-08-11 DIAGNOSIS — S72001A Fracture of unspecified part of neck of right femur, initial encounter for closed fracture: Secondary | ICD-10-CM | POA: Diagnosis not present

## 2018-08-11 DIAGNOSIS — R41841 Cognitive communication deficit: Secondary | ICD-10-CM | POA: Diagnosis not present

## 2018-08-11 DIAGNOSIS — S72001D Fracture of unspecified part of neck of right femur, subsequent encounter for closed fracture with routine healing: Secondary | ICD-10-CM | POA: Diagnosis not present

## 2018-08-11 DIAGNOSIS — E785 Hyperlipidemia, unspecified: Secondary | ICD-10-CM | POA: Diagnosis not present

## 2018-08-11 DIAGNOSIS — M80051D Age-related osteoporosis with current pathological fracture, right femur, subsequent encounter for fracture with routine healing: Secondary | ICD-10-CM | POA: Diagnosis not present

## 2018-08-11 DIAGNOSIS — R52 Pain, unspecified: Secondary | ICD-10-CM | POA: Diagnosis not present

## 2018-08-11 DIAGNOSIS — F329 Major depressive disorder, single episode, unspecified: Secondary | ICD-10-CM | POA: Diagnosis not present

## 2018-08-11 DIAGNOSIS — R2689 Other abnormalities of gait and mobility: Secondary | ICD-10-CM | POA: Diagnosis not present

## 2018-08-11 DIAGNOSIS — Z9181 History of falling: Secondary | ICD-10-CM | POA: Diagnosis not present

## 2018-08-11 DIAGNOSIS — K5900 Constipation, unspecified: Secondary | ICD-10-CM | POA: Diagnosis not present

## 2018-08-11 DIAGNOSIS — F3342 Major depressive disorder, recurrent, in full remission: Secondary | ICD-10-CM | POA: Diagnosis not present

## 2018-08-11 DIAGNOSIS — G3184 Mild cognitive impairment, so stated: Secondary | ICD-10-CM | POA: Diagnosis not present

## 2018-08-11 DIAGNOSIS — M6281 Muscle weakness (generalized): Secondary | ICD-10-CM | POA: Diagnosis not present

## 2018-08-11 DIAGNOSIS — D649 Anemia, unspecified: Secondary | ICD-10-CM | POA: Diagnosis not present

## 2018-08-11 DIAGNOSIS — R262 Difficulty in walking, not elsewhere classified: Secondary | ICD-10-CM | POA: Diagnosis not present

## 2018-08-11 DIAGNOSIS — E039 Hypothyroidism, unspecified: Secondary | ICD-10-CM | POA: Diagnosis not present

## 2018-08-11 DIAGNOSIS — G4701 Insomnia due to medical condition: Secondary | ICD-10-CM | POA: Diagnosis not present

## 2018-08-11 DIAGNOSIS — Z111 Encounter for screening for respiratory tuberculosis: Secondary | ICD-10-CM | POA: Diagnosis not present

## 2018-08-11 DIAGNOSIS — M62838 Other muscle spasm: Secondary | ICD-10-CM | POA: Diagnosis not present

## 2018-08-11 DIAGNOSIS — F419 Anxiety disorder, unspecified: Secondary | ICD-10-CM | POA: Diagnosis not present

## 2018-08-11 DIAGNOSIS — S72141D Displaced intertrochanteric fracture of right femur, subsequent encounter for closed fracture with routine healing: Secondary | ICD-10-CM | POA: Diagnosis not present

## 2018-08-11 LAB — TYPE AND SCREEN
ABO/RH(D): A POS
Antibody Screen: NEGATIVE
Unit division: 0

## 2018-08-11 LAB — CBC
HEMATOCRIT: 25.5 % — AB (ref 36.0–46.0)
Hemoglobin: 8.3 g/dL — ABNORMAL LOW (ref 12.0–15.0)
MCH: 30.2 pg (ref 26.0–34.0)
MCHC: 32.5 g/dL (ref 30.0–36.0)
MCV: 92.7 fL (ref 80.0–100.0)
Platelets: 155 10*3/uL (ref 150–400)
RBC: 2.75 MIL/uL — ABNORMAL LOW (ref 3.87–5.11)
RDW: 13.3 % (ref 11.5–15.5)
WBC: 4.2 10*3/uL (ref 4.0–10.5)
nRBC: 0 % (ref 0.0–0.2)

## 2018-08-11 LAB — BPAM RBC
Blood Product Expiration Date: 202002022359
ISSUE DATE / TIME: 202001091349
Unit Type and Rh: 6200

## 2018-08-11 MED ORDER — TAMSULOSIN HCL 0.4 MG PO CAPS
0.4000 mg | ORAL_CAPSULE | Freq: Every day | ORAL | Status: DC
  Administered 2018-08-11: 0.4 mg via ORAL
  Filled 2018-08-11: qty 1

## 2018-08-11 MED ORDER — TAMSULOSIN HCL 0.4 MG PO CAPS: 0.4000 mg | ORAL_CAPSULE | Freq: Every day | ORAL | 0 refills | Status: AC

## 2018-08-11 MED ORDER — FERROUS SULFATE 325 (65 FE) MG PO TABS
325.0000 mg | ORAL_TABLET | Freq: Two times a day (BID) | ORAL | Status: DC
  Administered 2018-08-11: 325 mg via ORAL
  Filled 2018-08-11: qty 1

## 2018-08-11 MED ORDER — FERROUS SULFATE 325 (65 FE) MG PO TABS: 325.0000 mg | ORAL_TABLET | Freq: Two times a day (BID) | ORAL | 0 refills | Status: AC

## 2018-08-11 NOTE — Discharge Summary (Signed)
Discharge Summary  Mackenzie Blanchard YIR:[id number] DOB: 1941/01/31  PCP: Lajean Manes, MD  Admit date: 08/07/2018 Discharge date: 08/11/2018  Time spent: 35 minutes  Discharge delayed on 08/10/18 due to bed placement.  Recommendations for Outpatient Follow-up:  1. Follow-up with your PCP 2. Follow-up with orthopedic surgery 3. Follow-up with urology 4. Continue physical therapy 5. Fall precautions  6. Take your medications as prescribed  Discharge Diagnoses:  Active Hospital Problems   Diagnosis Date Noted  . Closed right hip fracture, initial encounter ([place]) 08/08/2018  . Anxiety 03/22/2015    Resolved Hospital Problems  No resolved problems to display.    Discharge Condition: Stable  Diet recommendation: Resume previous diet  Vitals:   08/11/18 0000 08/11/18 0534  BP:  139/82  Pulse:  87  Resp:  18  Temp:  98.2 F (36.8 C)  SpO2: 95% 92%    History of present illness:  Mackenzie Blanchard an 78 y.o.femalepast medical history significant for anxiety presents to the ED with severe right hip pain after a mechanical fall in her bathroom. Orthopedic surgery was consulted and recommended ORIF on 08/08/2018.  POD #3 post right hip repair.  Hospital course complicated by acute urinary retention which appears to have resolved.  Foley catheter removed.  Patient has been able to void 200 cc this morning 08/11/18.  Discussed with Dr. Junious Silk, he recommended Foley catheter along with Flomax and follow-up in his office in 1 week for voiding trial.  Patient has voided since I discussed with Dr. Junious Silk.  Continue to monitor at SNF. Please call to make an appointment with Urology post hospital discharge.   Hospital course also complicated by symptomatic anemia/iron deficiency anemia/acute blood loss anemia for which she was transfused 1 unit PRBCs.  Hemoglobin now stable with no sign of overt bleeding.  08/11/2018: Patient seen and examined at bedside.  No acute events overnight.  Pain  is well controlled on current pain management.  States she feels better.  Denies weakness, chest pain, dyspnea, or palpitations.  She has no new complaints.  On the day of discharge, the patient was hemodynamically stable.  She will need to follow-up with her PCP, orthopedic surgery, urology posthospitalization.  She will also need to continue physical therapy at SNF.  Fall precautions.    Hospital Course:  Principal Problem:   Closed right hip fracture, initial encounter [facility]) Active Problems:   Anxiety  Closed right hip fracture, initial encounter ([name]) POD #3 post surgical repair Continue pain management with p.o. oxycodone for moderate to severe pain Robaxin as needed Continue bowel regimen with Senokot 2 tablets twice daily PT as recommended by orthopedic surgery Fall precautions DVT prophylaxis with aspirin 325 mg twice daily x1 month and SCDs in the hospital. Weightbearing as tolerated, recommended by orthopedic surgery Follow-up with orthopedic surgery in 2 weeks from 08/08/2018 on 08/21/18.  Resolving acute urinary retention Discussed with Dr. Joya Salm on 08/11/2018 when urinary retention was still present.  He recommended continuing Foley catheter for 5 days along with Flomax and follow-up in his office at [facility]: [phone].  Since then, patient's urinary retention has resolved. Follow-up with urology in a week.  Normocytic anemia/acute blood loss anemia Hemoglobin 8.3 from 7.5 post 1 unit PRBCs.  Baseline hemoglobin 12.2. No sign of overt bleeding Follow-up with your PCP  QTC prolongation Independently reviewed twelve-lead EKG done on 08/08/2018 which revealed prolonged QTC 522 Avoid QTC prolonging agents.  General anxiety disorder: Per her daughter at bedside was scheduled  to restart Cymbalta and stop Remeron Continue Cymbalta 20 mg daily Continue to hold off Remeron Follow-up with your psychiatrist outpatient  DVT prophylaxis:Aspirin 325 mg twice  daily  CODE STATUS: Full code.   Procedures:  Right hip repair  Consultations:  Orthopedic surgery  Discharge Exam: BP 139/82 (BP Location: Right Arm)   Pulse 87   Temp 98.2 F (36.8 C) (Oral)   Resp 18   Ht 5\' 6"  (1.676 m)   Wt 53.4 kg   SpO2 92%   BMI 19.00 kg/m  . General: 78 y.o. year-old female well-developed well-nourished in no acute distress.  Alert and oriented x3.   . Cardiovascular: Regular rate and rhythm with no rubs or gallops.  No JVD or thyromegaly noted.   Mackenzie Blanchard Respiratory: Clear to auscultation with no wheezes or rales.  Good inspiratory effort. . Abdomen: Soft nontender nondistended with normal bowel sounds x4 quadrants. . Psychiatry: Mood is appropriate for condition and setting  Discharge Instructions You were cared for by a hospitalist during your hospital stay. If you have any questions about your discharge medications or the care you received while you were in the hospital after you are discharged, you can call the unit and asked to speak with the hospitalist on call if the hospitalist that took care of you is not available. Once you are discharged, your primary care physician will handle any further medical issues. Please note that NO REFILLS for any discharge medications will be authorized once you are discharged, as it is imperative that you return to your primary care physician (or establish a relationship with a primary care physician if you do not have one) for your aftercare needs so that they can reassess your need for medications and monitor your lab values.  Discharge Instructions    Weight bearing as tolerated   Complete by:  As directed      Allergies as of 08/11/2018      Reactions   Codeine Other (See Comments)   Hallucinations   Penicillins Rash   DID THE REACTION INVOLVE: Swelling of the face/tongue/throat, SOB, or low BP? Yes Sudden or severe rash/hives, skin peeling, or the inside of the mouth or nose? No Did it require medical  treatment? No When did it last happen?30 yrs ago If all above answers are "NO", may proceed with cephalosporin use.      Medication List    STOP taking these medications   loperamide 2 MG capsule Commonly known as:  IMODIUM   mirtazapine 45 MG disintegrating tablet Commonly known as:  REMERON SOL-TAB   Potassium 99 MG Tabs     TAKE these medications   acetaminophen 325 MG tablet Commonly known as:  TYLENOL Take 2 tablets (650 mg total) by mouth every 6 (six) hours as needed for mild pain or moderate pain. What changed:    when to take this  reasons to take this   aspirin EC 325 MG tablet Take 1 tablet (325 mg total) by mouth 2 (two) times daily.   cholecalciferol 1000 units tablet Commonly known as:  VITAMIN D Take 1,000 Units by mouth 2 (two) times daily.   Co Q10 100 MG Caps Take 400 mg by mouth daily.   diphenhydrAMINE 25 MG tablet Commonly known as:  BENADRYL Take 25 mg by mouth at bedtime as needed for itching or sleep.   DULoxetine 20 MG capsule Commonly known as:  CYMBALTA Take 1 capsule (20 mg total) by mouth daily.   ferrous sulfate  325 (65 FE) MG tablet Take 1 tablet (325 mg total) by mouth 2 (two) times daily with a meal.   levothyroxine 88 MCG tablet Commonly known as:  SYNTHROID, LEVOTHROID Take 88 mcg by mouth daily.   lidocaine 5 % Commonly known as:  LIDODERM Place 1 patch onto the skin daily. Remove & Discard patch within 12 hours or as directed by MD   Lutein 20 MG Caps Take 40 mg by mouth daily.   Melatonin 5 MG Caps Take 15 mg by mouth at bedtime.   methocarbamol 500 MG tablet Commonly known as:  ROBAXIN Take 1 tablet (500 mg total) by mouth every 6 (six) hours as needed for muscle spasms.   multivitamin tablet Take 1 tablet by mouth daily.   oxyCODONE 5 MG immediate release tablet Commonly known as:  Oxy IR/ROXICODONE Take 0-1 tablets (0-5 mg total) by mouth every 6 (six) hours as needed (severe postop pain).     tamsulosin 0.4 MG Caps capsule Commonly known as:  FLOMAX Take 1 capsule (0.4 mg total) by mouth daily after breakfast. Start taking on:  August 12, 2018   traZODone 50 MG tablet Commonly known as:  DESYREL Take 75 mg by mouth at bedtime.            Discharge Care Instructions  (From admission, onward)         Start     Ordered   08/08/18 0000  Weight bearing as tolerated     08/08/18 1805         Allergies  Allergen Reactions  . Codeine Other (See Comments)    Hallucinations   . Penicillins Rash    DID THE REACTION INVOLVE: Swelling of the face/tongue/throat, SOB, or low BP? Yes Sudden or severe rash/hives, skin peeling, or the inside of the mouth or nose? No Did it require medical treatment? No When did it last happen?30 yrs ago If all above answers are "NO", may proceed with cephalosporin use.     Contact information for follow-up providers    Schedule an appointment as soon as possible for a visit with Milly Jakob, MD.   Specialty:  Orthopedic Surgery Why:  for approx. 2 weeks following surgery Contact information: [place] [address] [phone]        Lajean Manes, MD. Call in 1 day(s).   Specialty:  Internal Medicine Why:  Please call for a post hospital follow-up appointment. Contact information: [address] [place] [address] [phone]        Festus Aloe, MD. Call in 1 day(s).   Specialty:  Urology Why:  Please call for a post hospital follow-up appointment. Contact information: [address] [place] [address] [phone]            Contact information for after-discharge care    Destination    [facility] .   Service:  Skilled Nursing Contact information: [address] [place] [phone]                   The results of significant diagnostics from this hospitalization (including imaging, microbiology,  ancillary and laboratory) are listed below for reference.    Significant Diagnostic Studies: Dg Chest 2 View  Result Date: 08/07/2018 CLINICAL DATA:  Mechanical fall EXAM: CHEST - 2 VIEW COMPARISON:  11/21/2015, 11/17/2015 FINDINGS: No large pleural effusion. No acute airspace disease or pneumothorax. Stable cardiomediastinal silhouette with mildly tortuous aorta. Old right  fourth fifth and sixth rib fractures. Age indeterminate mild compression deformities of upper thoracic vertebra. IMPRESSION: 1. Negative for pleural effusion or pneumothorax 2. Age indeterminate mild compression deformities of 2 adjacent upper thoracic vertebra Electronically Signed   By: Donavan Foil M.D.   On: 08/07/2018 21:30   Ct Head Wo Contrast  Result Date: 08/07/2018 CLINICAL DATA:  Head trauma EXAM: CT HEAD WITHOUT CONTRAST TECHNIQUE: Contiguous axial images were obtained from the base of the skull through the vertex without intravenous contrast. COMPARISON:  Head CT 829 2016 FINDINGS: Brain: No acute territorial infarction, hemorrhage, or intracranial mass. Mild atrophy. Mild small vessel ischemic changes of the white matter. Vascular: No hyperdense vessels. Scattered calcifications at the carotid siphons. Skull: No fracture Sinuses/Orbits: No acute finding. Other: None IMPRESSION: 1. No CT evidence for acute intracranial abnormality. 2. Atrophy and mild small vessel ischemic changes of the white matter Electronically Signed   By: Donavan Foil M.D.   On: 08/07/2018 23:13   Ct Thoracic Spine Wo Contrast  Result Date: 08/07/2018 CLINICAL DATA:  [place] in bathroom EXAM: CT THORACIC SPINE WITHOUT CONTRAST TECHNIQUE: Multidetector CT images of the thoracic were obtained using the standard protocol without intravenous contrast. COMPARISON:  Chest x-ray 08/07/2018 FINDINGS: Alignment: Kyphosis of the spine. No subluxation. Facet alignment within normal limits. Vertebrae: Age indeterminate mild superior endplate deformity at T4. Age  indeterminate mild compression at T5 with less than 20% loss of height of the anterior vertebral body. Paraspinal and other soft tissues: No significant paravertebral or paraspinal soft tissue abnormality. Aortic atherosclerosis. 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 Lung fields demonstrate scarring at the apices. Mild bronchiectasis in the right middle lobe. Disc levels: Multiple level degenerative osteophytes anteriorly. Multiple old right posterior rib fractures. IMPRESSION: 1. Age indeterminate mild superior endplate deformity at T4 and mild age indeterminate compression deformity T5 but new since 2017 thoracic radiographs. MRI would be more sensitive for detection of marrow edema and assessing for fracture acuity. Negative for retropulsion or bony canal compromise. Electronically Signed   By: Donavan Foil M.D.   On: 08/07/2018 23:22   Dg C-arm 1-60 Min-no Report  Result Date: 08/08/2018 Fluoroscopy was utilized by the requesting physician.  No radiographic interpretation.   Dg Hip Operative Unilat W Or W/o Pelvis Right  Result Date: 08/08/2018 CLINICAL DATA:  Intraoperative imaging for fixation of a right intertrochanteric fracture the patient suffered a fall 08/07/2018. Initial encounter. EXAM: OPERATIVE RIGHT HIP (WITH PELVIS IF PERFORMED) 7 VIEWS TECHNIQUE: Fluoroscopic spot image(s) were submitted for interpretation post-operatively. COMPARISON:  Plain films right hip 08/07/2018. FINDINGS: Intraoperative images demonstrate placement of a right hip screw and long intramedullary nail for fixation of an intertrochanteric fracture. Hardware is intact. Position and alignment are markedly improved. No acute abnormality. IMPRESSION: Intraoperative imaging for fixation of a right intertrochanteric fracture. No acute finding. Electronically Signed   By: Inge Rise M.D.   On: 08/08/2018 18:28   Dg Hip Unilat  With Pelvis 2-3 Views Right  Result Date: 08/07/2018 CLINICAL DATA:  Fall with hip pain EXAM:  DG HIP (WITH OR WITHOUT PELVIS) 2-3V RIGHT COMPARISON:  None. FINDINGS: SI joints are non widened.  Pubic symphysis and rami are intact. Acute comminuted intertrochanteric fracture with medially displaced lesser trochanteric fracture fragment and mild cephalad displacement of the distal femur. Right femoral head projects in joint. IMPRESSION: Acute comminuted and displaced right femoral intertrochanteric fracture Electronically Signed   By: Donavan Foil M.D.   On: 08/07/2018 21:31  Microbiology: Recent Results (from the past 240 hour(s))  Surgical pcr screen     Status: None   Collection Time: 08/08/18  4:01 AM  Result Value Ref Range Status   MRSA, PCR NEGATIVE NEGATIVE Final   Staphylococcus aureus NEGATIVE NEGATIVE Final    Comment: (NOTE) The Xpert SA Assay (FDA approved for NASAL specimens in patients 2 years of age and older), is one component of a comprehensive surveillance program. It is not intended to diagnose infection nor to guide or monitor treatment. Performed at [facility], [place] [address], [place], [place] [address]      Labs: Basic Metabolic Panel: Recent Labs  Lab 08/07/18 2209 08/09/18 0446 08/10/18 0519  NA 136 135 137  K 3.6 3.7 3.5  CL 102 102 102  CO2 24 28 29   GLUCOSE 105* 96 115*  BUN 14 17 11   CREATININE 0.48 0.46 0.42*  CALCIUM 8.7* 8.1* 8.1*   Liver Function Tests: Recent Labs  Lab 08/07/18 2209  AST 15  ALT 15  ALKPHOS 41  BILITOT 0.4  PROT 5.9*  ALBUMIN 3.8   No results for input(s): LIPASE, AMYLASE in the last 168 hours. No results for input(s): AMMONIA in the last 168 hours. CBC: Recent Labs  Lab 08/07/18 2209 08/09/18 0446 08/10/18 0519 08/10/18 1331 08/11/18 0530  WBC 8.0 5.4 5.1  --  4.2  HGB 12.2 8.2* 7.4* 7.5* 8.3*  HCT 37.9 26.1* 23.5* 22.9* 25.5*  MCV 95.9 97.8 95.5  --  92.7  PLT 252 173 167  --  155   Cardiac Enzymes: No results for input(s): CKTOTAL, CKMB, CKMBINDEX, TROPONINI in the last  168 hours. BNP: BNP (last 3 results) No results for input(s): BNP in the last 8760 hours.  ProBNP (last 3 results) No results for input(s): PROBNP in the last 8760 hours.  CBG: No results for input(s): GLUCAP in the last 168 hours.     Signed:  Kayleen Memos, MD [facility] 08/11/2018, 12:40 PM

## 2018-08-11 NOTE — Clinical Social Work Placement (Signed)
Ship broker received.  [id number] starting today, Next Review day 08/13/2018.  PT/0T-TE, Speech-SD, NON-therapy-NF, Nursing PDE1, 558 Therapy minutes per week.  [facility] to transport.  Nurse call report to: [phone] Room: [place]  NOTE  Date:  08/11/2018  Patient Details  Name: Mackenzie Blanchard MRN: [id number] Date of Birth: May 31, 1941  Clinical Social Work is seeking post-discharge placement for this patient at the [place] level of care (*CSW will initial, date and re-position this form in  chart as items are completed):  Yes   Patient/family provided with [facility] Work Department's list of facilities offering this level of care within the geographic area requested by the patient (or if unable, by the patient's family).  Yes   Patient/family informed of their freedom to choose among providers that offer the needed level of care, that participate in Medicare, Medicaid or managed care program needed by the patient, have an available bed and are willing to accept the patient.  Yes   Patient/family informed of [facility]'s ownership interest in [facility] and [facility], as well as of the fact that they are under no obligation to receive care at these facilities.  PASRR submitted to EDS on       PASRR number received on       Existing PASRR number confirmed on 08/10/18     FL2 transmitted to all facilities in geographic area requested by pt/family on       FL2 transmitted to all facilities within larger geographic area on       Patient informed that his/her managed care company has contracts with or will negotiate with certain facilities, including the following:        Yes   Patient/family informed of bed offers received.  Patient chooses bed at [facility]     Physician recommends and patient chooses bed at      [name] to [facility] on 08/11/18.  [name] to  facility by PTAR      Patient family notified on 08/11/18 of transfer.  Name of family member notified:  Daughter: [name]     PHYSICIAN       Additional Comment:    _______________________________________________ Lia Hopping, LCSW 08/11/2018, 10:08 AM

## 2018-08-11 NOTE — Progress Notes (Signed)
Assumed care of patient at 1100. Pt is alert and oriented. VS stable and pt is resting in bed in no apparent distress. Family member at bedside. Pt up to bathroom with 2 assist and voided 200cc. Post void bladder scan revealed 14ml. PTA[facility] is en route. Report to be called to [facility]. Will continue to monitor with hourly rounding.

## 2018-08-11 NOTE — Progress Notes (Signed)
PTAAR arrived to pick up patient at 1315. Pt is stable for transport. Full report called to RN Lattie Haw at [facility]. PIV removed without difficulty. Pt belongings sent with patient's son and friend at bedside.

## 2018-08-14 DIAGNOSIS — D649 Anemia, unspecified: Secondary | ICD-10-CM | POA: Diagnosis not present

## 2018-08-14 DIAGNOSIS — I4581 Long QT syndrome: Secondary | ICD-10-CM | POA: Diagnosis not present

## 2018-08-14 DIAGNOSIS — S72001D Fracture of unspecified part of neck of right femur, subsequent encounter for closed fracture with routine healing: Secondary | ICD-10-CM | POA: Diagnosis not present

## 2018-08-14 DIAGNOSIS — F419 Anxiety disorder, unspecified: Secondary | ICD-10-CM | POA: Diagnosis not present

## 2018-08-15 DIAGNOSIS — M80051D Age-related osteoporosis with current pathological fracture, right femur, subsequent encounter for fracture with routine healing: Secondary | ICD-10-CM | POA: Diagnosis not present

## 2018-08-15 DIAGNOSIS — R52 Pain, unspecified: Secondary | ICD-10-CM | POA: Diagnosis not present

## 2018-08-15 DIAGNOSIS — D649 Anemia, unspecified: Secondary | ICD-10-CM | POA: Diagnosis not present

## 2018-08-15 DIAGNOSIS — R2689 Other abnormalities of gait and mobility: Secondary | ICD-10-CM | POA: Diagnosis not present

## 2018-08-17 DIAGNOSIS — F3342 Major depressive disorder, recurrent, in full remission: Secondary | ICD-10-CM | POA: Diagnosis not present

## 2018-08-18 DIAGNOSIS — D649 Anemia, unspecified: Secondary | ICD-10-CM | POA: Diagnosis not present

## 2018-08-18 DIAGNOSIS — F419 Anxiety disorder, unspecified: Secondary | ICD-10-CM | POA: Diagnosis not present

## 2018-08-18 DIAGNOSIS — S72001D Fracture of unspecified part of neck of right femur, subsequent encounter for closed fracture with routine healing: Secondary | ICD-10-CM | POA: Diagnosis not present

## 2018-08-18 DIAGNOSIS — I4581 Long QT syndrome: Secondary | ICD-10-CM | POA: Diagnosis not present

## 2018-08-21 DIAGNOSIS — S72001D Fracture of unspecified part of neck of right femur, subsequent encounter for closed fracture with routine healing: Secondary | ICD-10-CM | POA: Diagnosis not present

## 2018-08-21 DIAGNOSIS — F419 Anxiety disorder, unspecified: Secondary | ICD-10-CM | POA: Diagnosis not present

## 2018-08-21 DIAGNOSIS — I4581 Long QT syndrome: Secondary | ICD-10-CM | POA: Diagnosis not present

## 2018-08-21 DIAGNOSIS — D649 Anemia, unspecified: Secondary | ICD-10-CM | POA: Diagnosis not present

## 2018-08-22 DIAGNOSIS — S72001A Fracture of unspecified part of neck of right femur, initial encounter for closed fracture: Secondary | ICD-10-CM | POA: Diagnosis not present

## 2018-08-23 DIAGNOSIS — S72001D Fracture of unspecified part of neck of right femur, subsequent encounter for closed fracture with routine healing: Secondary | ICD-10-CM | POA: Diagnosis not present

## 2018-08-23 DIAGNOSIS — D649 Anemia, unspecified: Secondary | ICD-10-CM | POA: Diagnosis not present

## 2018-08-23 DIAGNOSIS — F419 Anxiety disorder, unspecified: Secondary | ICD-10-CM | POA: Diagnosis not present

## 2018-08-23 DIAGNOSIS — I4581 Long QT syndrome: Secondary | ICD-10-CM | POA: Diagnosis not present

## 2018-08-25 DIAGNOSIS — M80051D Age-related osteoporosis with current pathological fracture, right femur, subsequent encounter for fracture with routine healing: Secondary | ICD-10-CM | POA: Diagnosis not present

## 2018-08-25 DIAGNOSIS — R2689 Other abnormalities of gait and mobility: Secondary | ICD-10-CM | POA: Diagnosis not present

## 2018-08-25 DIAGNOSIS — R52 Pain, unspecified: Secondary | ICD-10-CM | POA: Diagnosis not present

## 2018-08-25 DIAGNOSIS — D649 Anemia, unspecified: Secondary | ICD-10-CM | POA: Diagnosis not present

## 2018-08-31 DIAGNOSIS — R350 Frequency of micturition: Secondary | ICD-10-CM | POA: Diagnosis not present

## 2018-08-31 DIAGNOSIS — E039 Hypothyroidism, unspecified: Secondary | ICD-10-CM | POA: Diagnosis not present

## 2018-08-31 DIAGNOSIS — F5101 Primary insomnia: Secondary | ICD-10-CM | POA: Diagnosis not present

## 2018-08-31 DIAGNOSIS — F411 Generalized anxiety disorder: Secondary | ICD-10-CM | POA: Diagnosis not present

## 2018-08-31 DIAGNOSIS — H6123 Impacted cerumen, bilateral: Secondary | ICD-10-CM | POA: Diagnosis not present

## 2018-09-01 DIAGNOSIS — H3530 Unspecified macular degeneration: Secondary | ICD-10-CM | POA: Diagnosis not present

## 2018-09-01 DIAGNOSIS — M8580 Other specified disorders of bone density and structure, unspecified site: Secondary | ICD-10-CM | POA: Diagnosis not present

## 2018-09-01 DIAGNOSIS — E039 Hypothyroidism, unspecified: Secondary | ICD-10-CM | POA: Diagnosis not present

## 2018-09-01 DIAGNOSIS — Z85038 Personal history of other malignant neoplasm of large intestine: Secondary | ICD-10-CM | POA: Diagnosis not present

## 2018-09-01 DIAGNOSIS — F325 Major depressive disorder, single episode, in full remission: Secondary | ICD-10-CM | POA: Diagnosis not present

## 2018-09-01 DIAGNOSIS — I872 Venous insufficiency (chronic) (peripheral): Secondary | ICD-10-CM | POA: Diagnosis not present

## 2018-09-01 DIAGNOSIS — Z9181 History of falling: Secondary | ICD-10-CM | POA: Diagnosis not present

## 2018-09-01 DIAGNOSIS — N3281 Overactive bladder: Secondary | ICD-10-CM | POA: Diagnosis not present

## 2018-09-01 DIAGNOSIS — Z86718 Personal history of other venous thrombosis and embolism: Secondary | ICD-10-CM | POA: Diagnosis not present

## 2018-09-01 DIAGNOSIS — F411 Generalized anxiety disorder: Secondary | ICD-10-CM | POA: Diagnosis not present

## 2018-09-01 DIAGNOSIS — Z7982 Long term (current) use of aspirin: Secondary | ICD-10-CM | POA: Diagnosis not present

## 2018-09-01 DIAGNOSIS — M47812 Spondylosis without myelopathy or radiculopathy, cervical region: Secondary | ICD-10-CM | POA: Diagnosis not present

## 2018-09-01 DIAGNOSIS — M80051D Age-related osteoporosis with current pathological fracture, right femur, subsequent encounter for fracture with routine healing: Secondary | ICD-10-CM | POA: Diagnosis not present

## 2018-09-01 DIAGNOSIS — E063 Autoimmune thyroiditis: Secondary | ICD-10-CM | POA: Diagnosis not present

## 2018-09-01 DIAGNOSIS — E042 Nontoxic multinodular goiter: Secondary | ICD-10-CM | POA: Diagnosis not present

## 2018-09-05 DIAGNOSIS — M25511 Pain in right shoulder: Secondary | ICD-10-CM | POA: Diagnosis not present

## 2018-09-05 DIAGNOSIS — S72141D Displaced intertrochanteric fracture of right femur, subsequent encounter for closed fracture with routine healing: Secondary | ICD-10-CM | POA: Diagnosis not present

## 2018-09-05 DIAGNOSIS — M1380 Other specified arthritis, unspecified site: Secondary | ICD-10-CM | POA: Diagnosis not present

## 2018-09-19 DIAGNOSIS — F411 Generalized anxiety disorder: Secondary | ICD-10-CM | POA: Diagnosis not present

## 2018-09-19 DIAGNOSIS — N3281 Overactive bladder: Secondary | ICD-10-CM | POA: Diagnosis not present

## 2018-09-19 DIAGNOSIS — E039 Hypothyroidism, unspecified: Secondary | ICD-10-CM | POA: Diagnosis not present

## 2018-09-19 DIAGNOSIS — Z7409 Other reduced mobility: Secondary | ICD-10-CM | POA: Diagnosis not present

## 2018-10-03 DIAGNOSIS — S72009A Fracture of unspecified part of neck of unspecified femur, initial encounter for closed fracture: Secondary | ICD-10-CM | POA: Diagnosis not present

## 2018-10-03 DIAGNOSIS — I872 Venous insufficiency (chronic) (peripheral): Secondary | ICD-10-CM | POA: Diagnosis not present

## 2018-10-03 DIAGNOSIS — K5900 Constipation, unspecified: Secondary | ICD-10-CM | POA: Diagnosis not present

## 2018-10-03 DIAGNOSIS — M80051D Age-related osteoporosis with current pathological fracture, right femur, subsequent encounter for fracture with routine healing: Secondary | ICD-10-CM | POA: Diagnosis not present

## 2018-10-03 DIAGNOSIS — F3340 Major depressive disorder, recurrent, in remission, unspecified: Secondary | ICD-10-CM | POA: Diagnosis not present

## 2018-10-03 DIAGNOSIS — Z85038 Personal history of other malignant neoplasm of large intestine: Secondary | ICD-10-CM | POA: Diagnosis not present

## 2018-10-03 DIAGNOSIS — Z9049 Acquired absence of other specified parts of digestive tract: Secondary | ICD-10-CM | POA: Diagnosis not present

## 2018-10-03 DIAGNOSIS — E039 Hypothyroidism, unspecified: Secondary | ICD-10-CM | POA: Diagnosis not present

## 2018-10-03 DIAGNOSIS — Z86718 Personal history of other venous thrombosis and embolism: Secondary | ICD-10-CM | POA: Diagnosis not present

## 2018-10-03 DIAGNOSIS — M47812 Spondylosis without myelopathy or radiculopathy, cervical region: Secondary | ICD-10-CM | POA: Diagnosis not present

## 2018-10-03 DIAGNOSIS — E063 Autoimmune thyroiditis: Secondary | ICD-10-CM | POA: Diagnosis not present

## 2018-10-03 DIAGNOSIS — N3281 Overactive bladder: Secondary | ICD-10-CM | POA: Diagnosis not present

## 2018-10-03 DIAGNOSIS — H3530 Unspecified macular degeneration: Secondary | ICD-10-CM | POA: Diagnosis not present

## 2018-10-03 DIAGNOSIS — M8580 Other specified disorders of bone density and structure, unspecified site: Secondary | ICD-10-CM | POA: Diagnosis not present

## 2018-10-03 DIAGNOSIS — F325 Major depressive disorder, single episode, in full remission: Secondary | ICD-10-CM | POA: Diagnosis not present

## 2018-10-03 DIAGNOSIS — Z9181 History of falling: Secondary | ICD-10-CM | POA: Diagnosis not present

## 2018-10-03 DIAGNOSIS — F411 Generalized anxiety disorder: Secondary | ICD-10-CM | POA: Diagnosis not present

## 2018-10-03 DIAGNOSIS — Z7982 Long term (current) use of aspirin: Secondary | ICD-10-CM | POA: Diagnosis not present

## 2018-10-03 DIAGNOSIS — E042 Nontoxic multinodular goiter: Secondary | ICD-10-CM | POA: Diagnosis not present

## 2018-10-05 DIAGNOSIS — S72001A Fracture of unspecified part of neck of right femur, initial encounter for closed fracture: Secondary | ICD-10-CM | POA: Diagnosis not present

## 2018-10-06 DIAGNOSIS — F325 Major depressive disorder, single episode, in full remission: Secondary | ICD-10-CM | POA: Diagnosis not present

## 2018-10-06 DIAGNOSIS — F411 Generalized anxiety disorder: Secondary | ICD-10-CM | POA: Diagnosis not present

## 2018-10-06 DIAGNOSIS — G4700 Insomnia, unspecified: Secondary | ICD-10-CM | POA: Diagnosis not present

## 2018-10-27 DIAGNOSIS — F3289 Other specified depressive episodes: Secondary | ICD-10-CM | POA: Diagnosis not present

## 2018-10-27 DIAGNOSIS — G4700 Insomnia, unspecified: Secondary | ICD-10-CM | POA: Diagnosis not present

## 2018-10-27 DIAGNOSIS — F411 Generalized anxiety disorder: Secondary | ICD-10-CM | POA: Diagnosis not present

## 2018-11-22 DIAGNOSIS — E039 Hypothyroidism, unspecified: Secondary | ICD-10-CM | POA: Diagnosis not present

## 2018-11-22 DIAGNOSIS — F329 Major depressive disorder, single episode, unspecified: Secondary | ICD-10-CM | POA: Diagnosis not present

## 2018-11-22 DIAGNOSIS — W06XXXA Fall from bed, initial encounter: Secondary | ICD-10-CM | POA: Diagnosis not present

## 2018-11-22 DIAGNOSIS — S0101XA Laceration without foreign body of scalp, initial encounter: Secondary | ICD-10-CM | POA: Diagnosis not present

## 2018-11-22 DIAGNOSIS — G8911 Acute pain due to trauma: Secondary | ICD-10-CM | POA: Diagnosis not present

## 2018-11-22 DIAGNOSIS — Z886 Allergy status to analgesic agent status: Secondary | ICD-10-CM | POA: Diagnosis not present

## 2018-11-22 DIAGNOSIS — Z85038 Personal history of other malignant neoplasm of large intestine: Secondary | ICD-10-CM | POA: Diagnosis not present

## 2018-11-22 DIAGNOSIS — Z043 Encounter for examination and observation following other accident: Secondary | ICD-10-CM | POA: Diagnosis not present

## 2018-11-22 DIAGNOSIS — Z888 Allergy status to other drugs, medicaments and biological substances status: Secondary | ICD-10-CM | POA: Diagnosis not present

## 2018-11-22 DIAGNOSIS — Z880 Allergy status to penicillin: Secondary | ICD-10-CM | POA: Diagnosis not present

## 2018-11-22 DIAGNOSIS — S0990XA Unspecified injury of head, initial encounter: Secondary | ICD-10-CM | POA: Diagnosis not present

## 2018-11-22 DIAGNOSIS — F419 Anxiety disorder, unspecified: Secondary | ICD-10-CM | POA: Diagnosis not present

## 2018-11-22 DIAGNOSIS — Z79899 Other long term (current) drug therapy: Secondary | ICD-10-CM | POA: Diagnosis not present

## 2018-12-01 DIAGNOSIS — F429 Obsessive-compulsive disorder, unspecified: Secondary | ICD-10-CM | POA: Diagnosis not present

## 2018-12-01 DIAGNOSIS — G4700 Insomnia, unspecified: Secondary | ICD-10-CM | POA: Diagnosis not present

## 2018-12-01 DIAGNOSIS — F331 Major depressive disorder, recurrent, moderate: Secondary | ICD-10-CM | POA: Diagnosis not present

## 2018-12-04 DIAGNOSIS — M79651 Pain in right thigh: Secondary | ICD-10-CM | POA: Diagnosis not present

## 2018-12-04 DIAGNOSIS — M25551 Pain in right hip: Secondary | ICD-10-CM | POA: Diagnosis not present

## 2018-12-05 DIAGNOSIS — M5030 Other cervical disc degeneration, unspecified cervical region: Secondary | ICD-10-CM | POA: Diagnosis not present

## 2018-12-05 DIAGNOSIS — F331 Major depressive disorder, recurrent, moderate: Secondary | ICD-10-CM | POA: Diagnosis not present

## 2018-12-05 DIAGNOSIS — R2681 Unsteadiness on feet: Secondary | ICD-10-CM | POA: Diagnosis not present

## 2018-12-05 DIAGNOSIS — F429 Obsessive-compulsive disorder, unspecified: Secondary | ICD-10-CM | POA: Diagnosis not present

## 2018-12-07 DIAGNOSIS — S72001D Fracture of unspecified part of neck of right femur, subsequent encounter for closed fracture with routine healing: Secondary | ICD-10-CM | POA: Diagnosis not present

## 2018-12-07 DIAGNOSIS — Z9181 History of falling: Secondary | ICD-10-CM | POA: Diagnosis not present

## 2018-12-07 DIAGNOSIS — Z85038 Personal history of other malignant neoplasm of large intestine: Secondary | ICD-10-CM | POA: Diagnosis not present

## 2018-12-07 DIAGNOSIS — H9193 Unspecified hearing loss, bilateral: Secondary | ICD-10-CM | POA: Diagnosis not present

## 2018-12-07 DIAGNOSIS — E039 Hypothyroidism, unspecified: Secondary | ICD-10-CM | POA: Diagnosis not present

## 2018-12-07 DIAGNOSIS — F331 Major depressive disorder, recurrent, moderate: Secondary | ICD-10-CM | POA: Diagnosis not present

## 2018-12-07 DIAGNOSIS — G4700 Insomnia, unspecified: Secondary | ICD-10-CM | POA: Diagnosis not present

## 2018-12-07 DIAGNOSIS — N3281 Overactive bladder: Secondary | ICD-10-CM | POA: Diagnosis not present

## 2018-12-07 DIAGNOSIS — F411 Generalized anxiety disorder: Secondary | ICD-10-CM | POA: Diagnosis not present

## 2018-12-07 DIAGNOSIS — M5030 Other cervical disc degeneration, unspecified cervical region: Secondary | ICD-10-CM | POA: Diagnosis not present

## 2018-12-07 DIAGNOSIS — Z86718 Personal history of other venous thrombosis and embolism: Secondary | ICD-10-CM | POA: Diagnosis not present

## 2018-12-07 DIAGNOSIS — H3530 Unspecified macular degeneration: Secondary | ICD-10-CM | POA: Diagnosis not present

## 2018-12-07 DIAGNOSIS — F429 Obsessive-compulsive disorder, unspecified: Secondary | ICD-10-CM | POA: Diagnosis not present

## 2018-12-21 DIAGNOSIS — F429 Obsessive-compulsive disorder, unspecified: Secondary | ICD-10-CM | POA: Diagnosis not present

## 2018-12-21 DIAGNOSIS — F331 Major depressive disorder, recurrent, moderate: Secondary | ICD-10-CM | POA: Diagnosis not present

## 2018-12-31 DIAGNOSIS — M2550 Pain in unspecified joint: Secondary | ICD-10-CM | POA: Diagnosis not present

## 2018-12-31 DIAGNOSIS — Z79899 Other long term (current) drug therapy: Secondary | ICD-10-CM | POA: Diagnosis not present

## 2018-12-31 DIAGNOSIS — S0990XA Unspecified injury of head, initial encounter: Secondary | ICD-10-CM | POA: Diagnosis not present

## 2018-12-31 DIAGNOSIS — F419 Anxiety disorder, unspecified: Secondary | ICD-10-CM | POA: Diagnosis not present

## 2018-12-31 DIAGNOSIS — S0001XA Abrasion of scalp, initial encounter: Secondary | ICD-10-CM | POA: Diagnosis not present

## 2018-12-31 DIAGNOSIS — Z885 Allergy status to narcotic agent status: Secondary | ICD-10-CM | POA: Diagnosis not present

## 2018-12-31 DIAGNOSIS — S0181XA Laceration without foreign body of other part of head, initial encounter: Secondary | ICD-10-CM | POA: Diagnosis not present

## 2018-12-31 DIAGNOSIS — E039 Hypothyroidism, unspecified: Secondary | ICD-10-CM | POA: Diagnosis not present

## 2018-12-31 DIAGNOSIS — Z880 Allergy status to penicillin: Secondary | ICD-10-CM | POA: Diagnosis not present

## 2018-12-31 DIAGNOSIS — Z7401 Bed confinement status: Secondary | ICD-10-CM | POA: Diagnosis not present

## 2018-12-31 DIAGNOSIS — W0110XA Fall on same level from slipping, tripping and stumbling with subsequent striking against unspecified object, initial encounter: Secondary | ICD-10-CM | POA: Diagnosis not present

## 2018-12-31 DIAGNOSIS — R531 Weakness: Secondary | ICD-10-CM | POA: Diagnosis not present

## 2018-12-31 DIAGNOSIS — Z888 Allergy status to other drugs, medicaments and biological substances status: Secondary | ICD-10-CM | POA: Diagnosis not present

## 2018-12-31 DIAGNOSIS — F329 Major depressive disorder, single episode, unspecified: Secondary | ICD-10-CM | POA: Diagnosis not present

## 2018-12-31 DIAGNOSIS — W19XXXA Unspecified fall, initial encounter: Secondary | ICD-10-CM | POA: Diagnosis not present

## 2018-12-31 DIAGNOSIS — Z85038 Personal history of other malignant neoplasm of large intestine: Secondary | ICD-10-CM | POA: Diagnosis not present

## 2019-01-06 DIAGNOSIS — S0993XA Unspecified injury of face, initial encounter: Secondary | ICD-10-CM | POA: Diagnosis not present

## 2019-01-06 DIAGNOSIS — Z66 Do not resuscitate: Secondary | ICD-10-CM | POA: Diagnosis not present

## 2019-01-06 DIAGNOSIS — S066X0A Traumatic subarachnoid hemorrhage without loss of consciousness, initial encounter: Secondary | ICD-10-CM | POA: Diagnosis not present

## 2019-01-06 DIAGNOSIS — I10 Essential (primary) hypertension: Secondary | ICD-10-CM | POA: Diagnosis not present

## 2019-01-06 DIAGNOSIS — Z881 Allergy status to other antibiotic agents status: Secondary | ICD-10-CM | POA: Diagnosis not present

## 2019-01-06 DIAGNOSIS — G4700 Insomnia, unspecified: Secondary | ICD-10-CM | POA: Diagnosis not present

## 2019-01-06 DIAGNOSIS — Z886 Allergy status to analgesic agent status: Secondary | ICD-10-CM | POA: Diagnosis not present

## 2019-01-06 DIAGNOSIS — F419 Anxiety disorder, unspecified: Secondary | ICD-10-CM | POA: Diagnosis not present

## 2019-01-06 DIAGNOSIS — Z043 Encounter for examination and observation following other accident: Secondary | ICD-10-CM | POA: Diagnosis not present

## 2019-01-06 DIAGNOSIS — S0990XA Unspecified injury of head, initial encounter: Secondary | ICD-10-CM | POA: Diagnosis not present

## 2019-01-06 DIAGNOSIS — R51 Headache: Secondary | ICD-10-CM | POA: Diagnosis not present

## 2019-01-06 DIAGNOSIS — F329 Major depressive disorder, single episode, unspecified: Secondary | ICD-10-CM | POA: Diagnosis not present

## 2019-01-06 DIAGNOSIS — N390 Urinary tract infection, site not specified: Secondary | ICD-10-CM | POA: Diagnosis not present

## 2019-01-06 DIAGNOSIS — Z79899 Other long term (current) drug therapy: Secondary | ICD-10-CM | POA: Diagnosis not present

## 2019-01-06 DIAGNOSIS — E039 Hypothyroidism, unspecified: Secondary | ICD-10-CM | POA: Diagnosis not present

## 2019-01-06 DIAGNOSIS — G20 Parkinson's disease: Secondary | ICD-10-CM | POA: Diagnosis not present

## 2019-01-06 DIAGNOSIS — W06XXXA Fall from bed, initial encounter: Secondary | ICD-10-CM | POA: Diagnosis not present

## 2019-01-06 DIAGNOSIS — Z1159 Encounter for screening for other viral diseases: Secondary | ICD-10-CM | POA: Diagnosis not present

## 2019-01-06 DIAGNOSIS — Z880 Allergy status to penicillin: Secondary | ICD-10-CM | POA: Diagnosis not present

## 2019-01-06 DIAGNOSIS — Z888 Allergy status to other drugs, medicaments and biological substances status: Secondary | ICD-10-CM | POA: Diagnosis not present

## 2019-01-06 DIAGNOSIS — R220 Localized swelling, mass and lump, head: Secondary | ICD-10-CM | POA: Diagnosis not present

## 2019-01-06 DIAGNOSIS — E871 Hypo-osmolality and hyponatremia: Secondary | ICD-10-CM | POA: Diagnosis not present

## 2019-01-06 DIAGNOSIS — S01111A Laceration without foreign body of right eyelid and periocular area, initial encounter: Secondary | ICD-10-CM | POA: Diagnosis not present

## 2019-01-06 DIAGNOSIS — S066X9A Traumatic subarachnoid hemorrhage with loss of consciousness of unspecified duration, initial encounter: Secondary | ICD-10-CM | POA: Diagnosis not present

## 2019-01-06 DIAGNOSIS — Z885 Allergy status to narcotic agent status: Secondary | ICD-10-CM | POA: Diagnosis not present

## 2019-01-11 DIAGNOSIS — Z9181 History of falling: Secondary | ICD-10-CM | POA: Diagnosis not present

## 2019-01-11 DIAGNOSIS — S72001D Fracture of unspecified part of neck of right femur, subsequent encounter for closed fracture with routine healing: Secondary | ICD-10-CM | POA: Diagnosis not present

## 2019-01-11 DIAGNOSIS — M5030 Other cervical disc degeneration, unspecified cervical region: Secondary | ICD-10-CM | POA: Diagnosis not present

## 2019-01-11 DIAGNOSIS — Z86718 Personal history of other venous thrombosis and embolism: Secondary | ICD-10-CM | POA: Diagnosis not present

## 2019-01-11 DIAGNOSIS — N3281 Overactive bladder: Secondary | ICD-10-CM | POA: Diagnosis not present

## 2019-01-11 DIAGNOSIS — Z85038 Personal history of other malignant neoplasm of large intestine: Secondary | ICD-10-CM | POA: Diagnosis not present

## 2019-01-11 DIAGNOSIS — H3530 Unspecified macular degeneration: Secondary | ICD-10-CM | POA: Diagnosis not present

## 2019-01-11 DIAGNOSIS — H9193 Unspecified hearing loss, bilateral: Secondary | ICD-10-CM | POA: Diagnosis not present

## 2019-01-11 DIAGNOSIS — F331 Major depressive disorder, recurrent, moderate: Secondary | ICD-10-CM | POA: Diagnosis not present

## 2019-01-11 DIAGNOSIS — E039 Hypothyroidism, unspecified: Secondary | ICD-10-CM | POA: Diagnosis not present

## 2019-01-11 DIAGNOSIS — F411 Generalized anxiety disorder: Secondary | ICD-10-CM | POA: Diagnosis not present

## 2019-01-11 DIAGNOSIS — G4700 Insomnia, unspecified: Secondary | ICD-10-CM | POA: Diagnosis not present

## 2019-01-11 DIAGNOSIS — F429 Obsessive-compulsive disorder, unspecified: Secondary | ICD-10-CM | POA: Diagnosis not present

## 2019-01-24 DIAGNOSIS — S066X0S Traumatic subarachnoid hemorrhage without loss of consciousness, sequela: Secondary | ICD-10-CM | POA: Diagnosis not present

## 2019-01-24 DIAGNOSIS — G20 Parkinson's disease: Secondary | ICD-10-CM | POA: Diagnosis not present

## 2019-01-24 DIAGNOSIS — I10 Essential (primary) hypertension: Secondary | ICD-10-CM | POA: Diagnosis not present

## 2019-02-05 DIAGNOSIS — H900 Conductive hearing loss, bilateral: Secondary | ICD-10-CM | POA: Diagnosis not present

## 2019-02-05 DIAGNOSIS — I10 Essential (primary) hypertension: Secondary | ICD-10-CM | POA: Diagnosis not present

## 2019-02-05 DIAGNOSIS — F429 Obsessive-compulsive disorder, unspecified: Secondary | ICD-10-CM | POA: Diagnosis not present

## 2019-02-05 DIAGNOSIS — Z86718 Personal history of other venous thrombosis and embolism: Secondary | ICD-10-CM | POA: Diagnosis not present

## 2019-02-05 DIAGNOSIS — H3530 Unspecified macular degeneration: Secondary | ICD-10-CM | POA: Diagnosis not present

## 2019-02-05 DIAGNOSIS — G20 Parkinson's disease: Secondary | ICD-10-CM | POA: Diagnosis not present

## 2019-02-05 DIAGNOSIS — G4709 Other insomnia: Secondary | ICD-10-CM | POA: Diagnosis not present

## 2019-02-05 DIAGNOSIS — S065X0D Traumatic subdural hemorrhage without loss of consciousness, subsequent encounter: Secondary | ICD-10-CM | POA: Diagnosis not present

## 2019-02-05 DIAGNOSIS — S72001D Fracture of unspecified part of neck of right femur, subsequent encounter for closed fracture with routine healing: Secondary | ICD-10-CM | POA: Diagnosis not present

## 2019-02-05 DIAGNOSIS — S066X0D Traumatic subarachnoid hemorrhage without loss of consciousness, subsequent encounter: Secondary | ICD-10-CM | POA: Diagnosis not present

## 2019-02-05 DIAGNOSIS — F411 Generalized anxiety disorder: Secondary | ICD-10-CM | POA: Diagnosis not present

## 2019-02-05 DIAGNOSIS — Z85038 Personal history of other malignant neoplasm of large intestine: Secondary | ICD-10-CM | POA: Diagnosis not present

## 2019-02-05 DIAGNOSIS — F331 Major depressive disorder, recurrent, moderate: Secondary | ICD-10-CM | POA: Diagnosis not present

## 2019-02-05 DIAGNOSIS — N3281 Overactive bladder: Secondary | ICD-10-CM | POA: Diagnosis not present

## 2019-02-05 DIAGNOSIS — E039 Hypothyroidism, unspecified: Secondary | ICD-10-CM | POA: Diagnosis not present

## 2019-02-05 DIAGNOSIS — M5030 Other cervical disc degeneration, unspecified cervical region: Secondary | ICD-10-CM | POA: Diagnosis not present

## 2019-02-08 DIAGNOSIS — Z20828 Contact with and (suspected) exposure to other viral communicable diseases: Secondary | ICD-10-CM | POA: Diagnosis not present

## 2019-02-08 DIAGNOSIS — Z885 Allergy status to narcotic agent status: Secondary | ICD-10-CM | POA: Diagnosis not present

## 2019-02-08 DIAGNOSIS — I6782 Cerebral ischemia: Secondary | ICD-10-CM | POA: Diagnosis not present

## 2019-02-08 DIAGNOSIS — B9620 Unspecified Escherichia coli [E. coli] as the cause of diseases classified elsewhere: Secondary | ICD-10-CM | POA: Diagnosis not present

## 2019-02-08 DIAGNOSIS — N3000 Acute cystitis without hematuria: Secondary | ICD-10-CM | POA: Diagnosis not present

## 2019-02-08 DIAGNOSIS — F419 Anxiety disorder, unspecified: Secondary | ICD-10-CM | POA: Diagnosis not present

## 2019-02-08 DIAGNOSIS — E611 Iron deficiency: Secondary | ICD-10-CM | POA: Diagnosis not present

## 2019-02-08 DIAGNOSIS — Z888 Allergy status to other drugs, medicaments and biological substances status: Secondary | ICD-10-CM | POA: Diagnosis not present

## 2019-02-08 DIAGNOSIS — R531 Weakness: Secondary | ICD-10-CM | POA: Diagnosis not present

## 2019-02-08 DIAGNOSIS — K5900 Constipation, unspecified: Secondary | ICD-10-CM | POA: Diagnosis not present

## 2019-02-08 DIAGNOSIS — G20 Parkinson's disease: Secondary | ICD-10-CM | POA: Diagnosis not present

## 2019-02-08 DIAGNOSIS — N390 Urinary tract infection, site not specified: Secondary | ICD-10-CM | POA: Diagnosis not present

## 2019-02-08 DIAGNOSIS — E871 Hypo-osmolality and hyponatremia: Secondary | ICD-10-CM | POA: Diagnosis not present

## 2019-02-08 DIAGNOSIS — R410 Disorientation, unspecified: Secondary | ICD-10-CM | POA: Diagnosis not present

## 2019-02-08 DIAGNOSIS — Z880 Allergy status to penicillin: Secondary | ICD-10-CM | POA: Diagnosis not present

## 2019-02-08 DIAGNOSIS — R7881 Bacteremia: Secondary | ICD-10-CM | POA: Diagnosis not present

## 2019-02-08 DIAGNOSIS — Z66 Do not resuscitate: Secondary | ICD-10-CM | POA: Diagnosis not present

## 2019-02-08 DIAGNOSIS — D509 Iron deficiency anemia, unspecified: Secondary | ICD-10-CM | POA: Diagnosis not present

## 2019-02-08 DIAGNOSIS — J8410 Pulmonary fibrosis, unspecified: Secondary | ICD-10-CM | POA: Diagnosis not present

## 2019-02-08 DIAGNOSIS — A419 Sepsis, unspecified organism: Secondary | ICD-10-CM | POA: Diagnosis not present

## 2019-02-08 DIAGNOSIS — R279 Unspecified lack of coordination: Secondary | ICD-10-CM | POA: Diagnosis not present

## 2019-02-08 DIAGNOSIS — Z85038 Personal history of other malignant neoplasm of large intestine: Secondary | ICD-10-CM | POA: Diagnosis not present

## 2019-02-08 DIAGNOSIS — R509 Fever, unspecified: Secondary | ICD-10-CM | POA: Diagnosis not present

## 2019-02-08 DIAGNOSIS — J984 Other disorders of lung: Secondary | ICD-10-CM | POA: Diagnosis not present

## 2019-02-08 DIAGNOSIS — Z743 Need for continuous supervision: Secondary | ICD-10-CM | POA: Diagnosis not present

## 2019-02-08 DIAGNOSIS — Z1159 Encounter for screening for other viral diseases: Secondary | ICD-10-CM | POA: Diagnosis not present

## 2019-02-08 DIAGNOSIS — R0902 Hypoxemia: Secondary | ICD-10-CM | POA: Diagnosis not present

## 2019-02-08 DIAGNOSIS — R4182 Altered mental status, unspecified: Secondary | ICD-10-CM | POA: Diagnosis not present

## 2019-02-08 DIAGNOSIS — E039 Hypothyroidism, unspecified: Secondary | ICD-10-CM | POA: Diagnosis not present

## 2019-02-16 DIAGNOSIS — F5101 Primary insomnia: Secondary | ICD-10-CM | POA: Diagnosis not present

## 2019-02-16 DIAGNOSIS — F429 Obsessive-compulsive disorder, unspecified: Secondary | ICD-10-CM | POA: Diagnosis not present

## 2019-02-16 DIAGNOSIS — F3340 Major depressive disorder, recurrent, in remission, unspecified: Secondary | ICD-10-CM | POA: Diagnosis not present

## 2019-02-20 DIAGNOSIS — R2681 Unsteadiness on feet: Secondary | ICD-10-CM | POA: Diagnosis not present

## 2019-02-20 DIAGNOSIS — E039 Hypothyroidism, unspecified: Secondary | ICD-10-CM | POA: Diagnosis not present

## 2019-02-20 DIAGNOSIS — M5030 Other cervical disc degeneration, unspecified cervical region: Secondary | ICD-10-CM | POA: Diagnosis not present

## 2019-02-20 DIAGNOSIS — N390 Urinary tract infection, site not specified: Secondary | ICD-10-CM | POA: Diagnosis not present

## 2019-03-06 DIAGNOSIS — H6123 Impacted cerumen, bilateral: Secondary | ICD-10-CM | POA: Diagnosis not present

## 2019-03-06 DIAGNOSIS — E039 Hypothyroidism, unspecified: Secondary | ICD-10-CM | POA: Diagnosis not present

## 2019-03-06 DIAGNOSIS — H900 Conductive hearing loss, bilateral: Secondary | ICD-10-CM | POA: Diagnosis not present

## 2019-03-06 DIAGNOSIS — N3281 Overactive bladder: Secondary | ICD-10-CM | POA: Diagnosis not present

## 2019-03-08 DIAGNOSIS — S72001D Fracture of unspecified part of neck of right femur, subsequent encounter for closed fracture with routine healing: Secondary | ICD-10-CM | POA: Diagnosis not present

## 2019-03-08 DIAGNOSIS — M5030 Other cervical disc degeneration, unspecified cervical region: Secondary | ICD-10-CM | POA: Diagnosis not present

## 2019-03-08 DIAGNOSIS — M67441 Ganglion, right hand: Secondary | ICD-10-CM | POA: Diagnosis not present

## 2019-03-08 DIAGNOSIS — J309 Allergic rhinitis, unspecified: Secondary | ICD-10-CM | POA: Diagnosis not present

## 2019-03-08 DIAGNOSIS — F331 Major depressive disorder, recurrent, moderate: Secondary | ICD-10-CM | POA: Diagnosis not present

## 2019-03-08 DIAGNOSIS — S065X0D Traumatic subdural hemorrhage without loss of consciousness, subsequent encounter: Secondary | ICD-10-CM | POA: Diagnosis not present

## 2019-03-08 DIAGNOSIS — I2510 Atherosclerotic heart disease of native coronary artery without angina pectoris: Secondary | ICD-10-CM | POA: Diagnosis not present

## 2019-03-08 DIAGNOSIS — G20 Parkinson's disease: Secondary | ICD-10-CM | POA: Diagnosis not present

## 2019-03-08 DIAGNOSIS — D509 Iron deficiency anemia, unspecified: Secondary | ICD-10-CM | POA: Diagnosis not present

## 2019-03-08 DIAGNOSIS — F411 Generalized anxiety disorder: Secondary | ICD-10-CM | POA: Diagnosis not present

## 2019-03-08 DIAGNOSIS — M8580 Other specified disorders of bone density and structure, unspecified site: Secondary | ICD-10-CM | POA: Diagnosis not present

## 2019-03-08 DIAGNOSIS — K5641 Fecal impaction: Secondary | ICD-10-CM | POA: Diagnosis not present

## 2019-03-08 DIAGNOSIS — I10 Essential (primary) hypertension: Secondary | ICD-10-CM | POA: Diagnosis not present

## 2019-03-08 DIAGNOSIS — F429 Obsessive-compulsive disorder, unspecified: Secondary | ICD-10-CM | POA: Diagnosis not present

## 2019-03-08 DIAGNOSIS — S066X0D Traumatic subarachnoid hemorrhage without loss of consciousness, subsequent encounter: Secondary | ICD-10-CM | POA: Diagnosis not present

## 2019-03-08 DIAGNOSIS — A4151 Sepsis due to Escherichia coli [E. coli]: Secondary | ICD-10-CM | POA: Diagnosis not present

## 2019-03-15 DIAGNOSIS — R8290 Unspecified abnormal findings in urine: Secondary | ICD-10-CM | POA: Diagnosis not present

## 2019-03-16 DIAGNOSIS — G20 Parkinson's disease: Secondary | ICD-10-CM | POA: Diagnosis not present

## 2019-03-26 DIAGNOSIS — S066X0S Traumatic subarachnoid hemorrhage without loss of consciousness, sequela: Secondary | ICD-10-CM | POA: Diagnosis not present

## 2019-03-26 DIAGNOSIS — I10 Essential (primary) hypertension: Secondary | ICD-10-CM | POA: Diagnosis not present

## 2019-03-26 DIAGNOSIS — G20 Parkinson's disease: Secondary | ICD-10-CM | POA: Diagnosis not present

## 2019-03-29 DIAGNOSIS — D509 Iron deficiency anemia, unspecified: Secondary | ICD-10-CM | POA: Diagnosis not present

## 2019-03-29 DIAGNOSIS — E039 Hypothyroidism, unspecified: Secondary | ICD-10-CM | POA: Diagnosis not present

## 2019-03-29 DIAGNOSIS — I10 Essential (primary) hypertension: Secondary | ICD-10-CM | POA: Diagnosis not present

## 2019-04-10 DIAGNOSIS — S065X0D Traumatic subdural hemorrhage without loss of consciousness, subsequent encounter: Secondary | ICD-10-CM | POA: Diagnosis not present

## 2019-04-10 DIAGNOSIS — F99 Mental disorder, not otherwise specified: Secondary | ICD-10-CM | POA: Diagnosis not present

## 2019-04-10 DIAGNOSIS — S72009A Fracture of unspecified part of neck of unspecified femur, initial encounter for closed fracture: Secondary | ICD-10-CM | POA: Diagnosis not present

## 2019-04-10 DIAGNOSIS — S72001D Fracture of unspecified part of neck of right femur, subsequent encounter for closed fracture with routine healing: Secondary | ICD-10-CM | POA: Diagnosis not present

## 2019-04-10 DIAGNOSIS — K5641 Fecal impaction: Secondary | ICD-10-CM | POA: Diagnosis not present

## 2019-04-10 DIAGNOSIS — J309 Allergic rhinitis, unspecified: Secondary | ICD-10-CM | POA: Diagnosis not present

## 2019-04-10 DIAGNOSIS — D509 Iron deficiency anemia, unspecified: Secondary | ICD-10-CM | POA: Diagnosis not present

## 2019-04-10 DIAGNOSIS — M8580 Other specified disorders of bone density and structure, unspecified site: Secondary | ICD-10-CM | POA: Diagnosis not present

## 2019-04-10 DIAGNOSIS — F331 Major depressive disorder, recurrent, moderate: Secondary | ICD-10-CM | POA: Diagnosis not present

## 2019-04-10 DIAGNOSIS — F429 Obsessive-compulsive disorder, unspecified: Secondary | ICD-10-CM | POA: Diagnosis not present

## 2019-04-10 DIAGNOSIS — I10 Essential (primary) hypertension: Secondary | ICD-10-CM | POA: Diagnosis not present

## 2019-04-10 DIAGNOSIS — I2510 Atherosclerotic heart disease of native coronary artery without angina pectoris: Secondary | ICD-10-CM | POA: Diagnosis not present

## 2019-04-10 DIAGNOSIS — G20 Parkinson's disease: Secondary | ICD-10-CM | POA: Diagnosis not present

## 2019-04-10 DIAGNOSIS — F411 Generalized anxiety disorder: Secondary | ICD-10-CM | POA: Diagnosis not present

## 2019-04-10 DIAGNOSIS — M5030 Other cervical disc degeneration, unspecified cervical region: Secondary | ICD-10-CM | POA: Diagnosis not present

## 2019-04-10 DIAGNOSIS — M67441 Ganglion, right hand: Secondary | ICD-10-CM | POA: Diagnosis not present

## 2019-04-10 DIAGNOSIS — S066X0D Traumatic subarachnoid hemorrhage without loss of consciousness, subsequent encounter: Secondary | ICD-10-CM | POA: Diagnosis not present

## 2019-04-10 DIAGNOSIS — T169XXA Foreign body in ear, unspecified ear, initial encounter: Secondary | ICD-10-CM | POA: Diagnosis not present

## 2019-04-13 DIAGNOSIS — F429 Obsessive-compulsive disorder, unspecified: Secondary | ICD-10-CM | POA: Diagnosis not present

## 2019-04-13 DIAGNOSIS — F331 Major depressive disorder, recurrent, moderate: Secondary | ICD-10-CM | POA: Diagnosis not present

## 2019-04-13 DIAGNOSIS — F5101 Primary insomnia: Secondary | ICD-10-CM | POA: Diagnosis not present

## 2019-04-16 DIAGNOSIS — G20 Parkinson's disease: Secondary | ICD-10-CM | POA: Diagnosis not present

## 2019-05-08 DIAGNOSIS — I10 Essential (primary) hypertension: Secondary | ICD-10-CM | POA: Diagnosis not present

## 2019-05-08 DIAGNOSIS — J309 Allergic rhinitis, unspecified: Secondary | ICD-10-CM | POA: Diagnosis not present

## 2019-05-08 DIAGNOSIS — F411 Generalized anxiety disorder: Secondary | ICD-10-CM | POA: Diagnosis not present

## 2019-05-08 DIAGNOSIS — D509 Iron deficiency anemia, unspecified: Secondary | ICD-10-CM | POA: Diagnosis not present

## 2019-05-08 DIAGNOSIS — M5030 Other cervical disc degeneration, unspecified cervical region: Secondary | ICD-10-CM | POA: Diagnosis not present

## 2019-05-08 DIAGNOSIS — M67441 Ganglion, right hand: Secondary | ICD-10-CM | POA: Diagnosis not present

## 2019-05-08 DIAGNOSIS — F99 Mental disorder, not otherwise specified: Secondary | ICD-10-CM | POA: Diagnosis not present

## 2019-05-08 DIAGNOSIS — S066X0D Traumatic subarachnoid hemorrhage without loss of consciousness, subsequent encounter: Secondary | ICD-10-CM | POA: Diagnosis not present

## 2019-05-08 DIAGNOSIS — K5641 Fecal impaction: Secondary | ICD-10-CM | POA: Diagnosis not present

## 2019-05-08 DIAGNOSIS — S72001D Fracture of unspecified part of neck of right femur, subsequent encounter for closed fracture with routine healing: Secondary | ICD-10-CM | POA: Diagnosis not present

## 2019-05-08 DIAGNOSIS — G20 Parkinson's disease: Secondary | ICD-10-CM | POA: Diagnosis not present

## 2019-05-08 DIAGNOSIS — M8580 Other specified disorders of bone density and structure, unspecified site: Secondary | ICD-10-CM | POA: Diagnosis not present

## 2019-05-08 DIAGNOSIS — S065X0D Traumatic subdural hemorrhage without loss of consciousness, subsequent encounter: Secondary | ICD-10-CM | POA: Diagnosis not present

## 2019-05-08 DIAGNOSIS — F429 Obsessive-compulsive disorder, unspecified: Secondary | ICD-10-CM | POA: Diagnosis not present

## 2019-05-08 DIAGNOSIS — F331 Major depressive disorder, recurrent, moderate: Secondary | ICD-10-CM | POA: Diagnosis not present

## 2019-05-08 DIAGNOSIS — I2510 Atherosclerotic heart disease of native coronary artery without angina pectoris: Secondary | ICD-10-CM | POA: Diagnosis not present

## 2019-05-15 DIAGNOSIS — F429 Obsessive-compulsive disorder, unspecified: Secondary | ICD-10-CM | POA: Diagnosis not present

## 2019-05-15 DIAGNOSIS — G20 Parkinson's disease: Secondary | ICD-10-CM | POA: Diagnosis not present

## 2019-05-15 DIAGNOSIS — K5900 Constipation, unspecified: Secondary | ICD-10-CM | POA: Diagnosis not present

## 2019-05-16 DIAGNOSIS — G20 Parkinson's disease: Secondary | ICD-10-CM | POA: Diagnosis not present

## 2019-05-22 DIAGNOSIS — M5030 Other cervical disc degeneration, unspecified cervical region: Secondary | ICD-10-CM | POA: Diagnosis not present

## 2019-05-22 DIAGNOSIS — E039 Hypothyroidism, unspecified: Secondary | ICD-10-CM | POA: Diagnosis not present

## 2019-05-22 DIAGNOSIS — N390 Urinary tract infection, site not specified: Secondary | ICD-10-CM | POA: Diagnosis not present

## 2019-05-22 DIAGNOSIS — F3340 Major depressive disorder, recurrent, in remission, unspecified: Secondary | ICD-10-CM | POA: Diagnosis not present

## 2019-06-06 DIAGNOSIS — G20 Parkinson's disease: Secondary | ICD-10-CM | POA: Diagnosis not present

## 2019-06-06 DIAGNOSIS — M8580 Other specified disorders of bone density and structure, unspecified site: Secondary | ICD-10-CM | POA: Diagnosis not present

## 2019-06-06 DIAGNOSIS — G259 Extrapyramidal and movement disorder, unspecified: Secondary | ICD-10-CM | POA: Diagnosis not present

## 2019-06-06 DIAGNOSIS — F331 Major depressive disorder, recurrent, moderate: Secondary | ICD-10-CM | POA: Diagnosis not present

## 2019-06-06 DIAGNOSIS — D509 Iron deficiency anemia, unspecified: Secondary | ICD-10-CM | POA: Diagnosis not present

## 2019-06-06 DIAGNOSIS — M67441 Ganglion, right hand: Secondary | ICD-10-CM | POA: Diagnosis not present

## 2019-06-06 DIAGNOSIS — S72001D Fracture of unspecified part of neck of right femur, subsequent encounter for closed fracture with routine healing: Secondary | ICD-10-CM | POA: Diagnosis not present

## 2019-06-06 DIAGNOSIS — K5641 Fecal impaction: Secondary | ICD-10-CM | POA: Diagnosis not present

## 2019-06-06 DIAGNOSIS — I10 Essential (primary) hypertension: Secondary | ICD-10-CM | POA: Diagnosis not present

## 2019-06-06 DIAGNOSIS — E041 Nontoxic single thyroid nodule: Secondary | ICD-10-CM | POA: Diagnosis not present

## 2019-06-06 DIAGNOSIS — F411 Generalized anxiety disorder: Secondary | ICD-10-CM | POA: Diagnosis not present

## 2019-06-06 DIAGNOSIS — M5030 Other cervical disc degeneration, unspecified cervical region: Secondary | ICD-10-CM | POA: Diagnosis not present

## 2019-06-06 DIAGNOSIS — I2510 Atherosclerotic heart disease of native coronary artery without angina pectoris: Secondary | ICD-10-CM | POA: Diagnosis not present

## 2019-06-06 DIAGNOSIS — F99 Mental disorder, not otherwise specified: Secondary | ICD-10-CM | POA: Diagnosis not present

## 2019-06-06 DIAGNOSIS — F429 Obsessive-compulsive disorder, unspecified: Secondary | ICD-10-CM | POA: Diagnosis not present

## 2019-06-06 DIAGNOSIS — H3530 Unspecified macular degeneration: Secondary | ICD-10-CM | POA: Diagnosis not present

## 2019-06-16 DIAGNOSIS — G20 Parkinson's disease: Secondary | ICD-10-CM | POA: Diagnosis not present

## 2019-07-09 DIAGNOSIS — K5641 Fecal impaction: Secondary | ICD-10-CM | POA: Diagnosis not present

## 2019-07-09 DIAGNOSIS — I10 Essential (primary) hypertension: Secondary | ICD-10-CM | POA: Diagnosis not present

## 2019-07-09 DIAGNOSIS — F411 Generalized anxiety disorder: Secondary | ICD-10-CM | POA: Diagnosis not present

## 2019-07-09 DIAGNOSIS — M67441 Ganglion, right hand: Secondary | ICD-10-CM | POA: Diagnosis not present

## 2019-07-09 DIAGNOSIS — F331 Major depressive disorder, recurrent, moderate: Secondary | ICD-10-CM | POA: Diagnosis not present

## 2019-07-09 DIAGNOSIS — G20 Parkinson's disease: Secondary | ICD-10-CM | POA: Diagnosis not present

## 2019-07-09 DIAGNOSIS — S72001D Fracture of unspecified part of neck of right femur, subsequent encounter for closed fracture with routine healing: Secondary | ICD-10-CM | POA: Diagnosis not present

## 2019-07-09 DIAGNOSIS — F429 Obsessive-compulsive disorder, unspecified: Secondary | ICD-10-CM | POA: Diagnosis not present

## 2019-07-09 DIAGNOSIS — G259 Extrapyramidal and movement disorder, unspecified: Secondary | ICD-10-CM | POA: Diagnosis not present

## 2019-07-09 DIAGNOSIS — M5030 Other cervical disc degeneration, unspecified cervical region: Secondary | ICD-10-CM | POA: Diagnosis not present

## 2019-07-09 DIAGNOSIS — F99 Mental disorder, not otherwise specified: Secondary | ICD-10-CM | POA: Diagnosis not present

## 2019-07-09 DIAGNOSIS — I2510 Atherosclerotic heart disease of native coronary artery without angina pectoris: Secondary | ICD-10-CM | POA: Diagnosis not present

## 2019-07-09 DIAGNOSIS — H3530 Unspecified macular degeneration: Secondary | ICD-10-CM | POA: Diagnosis not present

## 2019-07-09 DIAGNOSIS — D509 Iron deficiency anemia, unspecified: Secondary | ICD-10-CM | POA: Diagnosis not present

## 2019-07-09 DIAGNOSIS — M8580 Other specified disorders of bone density and structure, unspecified site: Secondary | ICD-10-CM | POA: Diagnosis not present

## 2019-07-09 DIAGNOSIS — E041 Nontoxic single thyroid nodule: Secondary | ICD-10-CM | POA: Diagnosis not present

## 2019-07-10 DIAGNOSIS — G20 Parkinson's disease: Secondary | ICD-10-CM | POA: Diagnosis not present

## 2019-07-10 DIAGNOSIS — K5900 Constipation, unspecified: Secondary | ICD-10-CM | POA: Diagnosis not present

## 2019-07-10 DIAGNOSIS — F3340 Major depressive disorder, recurrent, in remission, unspecified: Secondary | ICD-10-CM | POA: Diagnosis not present

## 2019-07-12 DIAGNOSIS — F5101 Primary insomnia: Secondary | ICD-10-CM | POA: Diagnosis not present

## 2019-07-12 DIAGNOSIS — F411 Generalized anxiety disorder: Secondary | ICD-10-CM | POA: Diagnosis not present

## 2019-07-12 DIAGNOSIS — F331 Major depressive disorder, recurrent, moderate: Secondary | ICD-10-CM | POA: Diagnosis not present

## 2019-07-12 DIAGNOSIS — F607 Dependent personality disorder: Secondary | ICD-10-CM | POA: Diagnosis not present

## 2019-07-16 DIAGNOSIS — G20 Parkinson's disease: Secondary | ICD-10-CM | POA: Diagnosis not present

## 2019-08-09 DIAGNOSIS — F331 Major depressive disorder, recurrent, moderate: Secondary | ICD-10-CM | POA: Diagnosis not present

## 2019-08-09 DIAGNOSIS — F607 Dependent personality disorder: Secondary | ICD-10-CM | POA: Diagnosis not present

## 2019-08-09 DIAGNOSIS — F5101 Primary insomnia: Secondary | ICD-10-CM | POA: Diagnosis not present

## 2019-08-09 DIAGNOSIS — F411 Generalized anxiety disorder: Secondary | ICD-10-CM | POA: Diagnosis not present

## 2019-08-14 DIAGNOSIS — Z1159 Encounter for screening for other viral diseases: Secondary | ICD-10-CM | POA: Diagnosis not present

## 2019-08-16 DIAGNOSIS — G20 Parkinson's disease: Secondary | ICD-10-CM | POA: Diagnosis not present

## 2019-08-21 DIAGNOSIS — R350 Frequency of micturition: Secondary | ICD-10-CM | POA: Diagnosis not present

## 2019-08-21 DIAGNOSIS — F429 Obsessive-compulsive disorder, unspecified: Secondary | ICD-10-CM | POA: Diagnosis not present

## 2019-08-21 DIAGNOSIS — F3340 Major depressive disorder, recurrent, in remission, unspecified: Secondary | ICD-10-CM | POA: Diagnosis not present

## 2019-08-24 DIAGNOSIS — F411 Generalized anxiety disorder: Secondary | ICD-10-CM | POA: Diagnosis not present

## 2019-08-24 DIAGNOSIS — F331 Major depressive disorder, recurrent, moderate: Secondary | ICD-10-CM | POA: Diagnosis not present

## 2019-08-28 DIAGNOSIS — G20 Parkinson's disease: Secondary | ICD-10-CM | POA: Diagnosis not present

## 2019-08-28 DIAGNOSIS — N390 Urinary tract infection, site not specified: Secondary | ICD-10-CM | POA: Diagnosis not present

## 2019-08-28 DIAGNOSIS — R410 Disorientation, unspecified: Secondary | ICD-10-CM | POA: Diagnosis not present

## 2019-09-06 DIAGNOSIS — N3281 Overactive bladder: Secondary | ICD-10-CM | POA: Diagnosis not present

## 2019-09-06 DIAGNOSIS — F411 Generalized anxiety disorder: Secondary | ICD-10-CM | POA: Diagnosis not present

## 2019-09-06 DIAGNOSIS — E039 Hypothyroidism, unspecified: Secondary | ICD-10-CM | POA: Diagnosis not present

## 2019-09-06 DIAGNOSIS — F5101 Primary insomnia: Secondary | ICD-10-CM | POA: Diagnosis not present

## 2019-09-16 DIAGNOSIS — G20 Parkinson's disease: Secondary | ICD-10-CM | POA: Diagnosis not present

## 2019-09-18 DIAGNOSIS — M5030 Other cervical disc degeneration, unspecified cervical region: Secondary | ICD-10-CM | POA: Diagnosis not present

## 2019-09-18 DIAGNOSIS — G20 Parkinson's disease: Secondary | ICD-10-CM | POA: Diagnosis not present

## 2019-09-18 DIAGNOSIS — N390 Urinary tract infection, site not specified: Secondary | ICD-10-CM | POA: Diagnosis not present

## 2019-09-19 DIAGNOSIS — Z1159 Encounter for screening for other viral diseases: Secondary | ICD-10-CM | POA: Diagnosis not present

## 2019-09-25 DIAGNOSIS — G20 Parkinson's disease: Secondary | ICD-10-CM | POA: Diagnosis not present

## 2019-09-25 DIAGNOSIS — F429 Obsessive-compulsive disorder, unspecified: Secondary | ICD-10-CM | POA: Diagnosis not present

## 2019-09-25 DIAGNOSIS — F411 Generalized anxiety disorder: Secondary | ICD-10-CM | POA: Diagnosis not present

## 2019-09-25 DIAGNOSIS — R4182 Altered mental status, unspecified: Secondary | ICD-10-CM | POA: Diagnosis not present

## 2019-09-27 DIAGNOSIS — E7220 Disorder of urea cycle metabolism, unspecified: Secondary | ICD-10-CM | POA: Diagnosis not present

## 2019-09-27 DIAGNOSIS — I10 Essential (primary) hypertension: Secondary | ICD-10-CM | POA: Diagnosis not present

## 2019-09-28 DIAGNOSIS — F331 Major depressive disorder, recurrent, moderate: Secondary | ICD-10-CM | POA: Diagnosis not present

## 2019-09-28 DIAGNOSIS — F5101 Primary insomnia: Secondary | ICD-10-CM | POA: Diagnosis not present

## 2019-09-28 DIAGNOSIS — F428 Other obsessive-compulsive disorder: Secondary | ICD-10-CM | POA: Diagnosis not present

## 2019-10-09 DIAGNOSIS — F429 Obsessive-compulsive disorder, unspecified: Secondary | ICD-10-CM | POA: Diagnosis not present

## 2019-10-09 DIAGNOSIS — E538 Deficiency of other specified B group vitamins: Secondary | ICD-10-CM | POA: Diagnosis not present

## 2019-10-09 DIAGNOSIS — E039 Hypothyroidism, unspecified: Secondary | ICD-10-CM | POA: Diagnosis not present

## 2019-10-14 DIAGNOSIS — G20 Parkinson's disease: Secondary | ICD-10-CM | POA: Diagnosis not present

## 2019-10-17 DIAGNOSIS — F411 Generalized anxiety disorder: Secondary | ICD-10-CM | POA: Diagnosis not present

## 2019-10-17 DIAGNOSIS — F3289 Other specified depressive episodes: Secondary | ICD-10-CM | POA: Diagnosis not present

## 2019-10-17 DIAGNOSIS — F428 Other obsessive-compulsive disorder: Secondary | ICD-10-CM | POA: Diagnosis not present

## 2019-10-18 DIAGNOSIS — N390 Urinary tract infection, site not specified: Secondary | ICD-10-CM | POA: Diagnosis not present

## 2019-10-23 DIAGNOSIS — G20 Parkinson's disease: Secondary | ICD-10-CM | POA: Diagnosis not present

## 2019-10-23 DIAGNOSIS — F429 Obsessive-compulsive disorder, unspecified: Secondary | ICD-10-CM | POA: Diagnosis not present

## 2019-10-23 DIAGNOSIS — N390 Urinary tract infection, site not specified: Secondary | ICD-10-CM | POA: Diagnosis not present

## 2019-10-30 DIAGNOSIS — F331 Major depressive disorder, recurrent, moderate: Secondary | ICD-10-CM | POA: Diagnosis not present

## 2019-10-30 DIAGNOSIS — N390 Urinary tract infection, site not specified: Secondary | ICD-10-CM | POA: Diagnosis not present

## 2019-10-30 DIAGNOSIS — F429 Obsessive-compulsive disorder, unspecified: Secondary | ICD-10-CM | POA: Diagnosis not present

## 2019-11-01 DIAGNOSIS — G20 Parkinson's disease: Secondary | ICD-10-CM | POA: Diagnosis not present

## 2019-11-06 DIAGNOSIS — G20 Parkinson's disease: Secondary | ICD-10-CM | POA: Diagnosis not present

## 2019-11-06 DIAGNOSIS — W19XXXA Unspecified fall, initial encounter: Secondary | ICD-10-CM | POA: Diagnosis not present

## 2019-11-06 DIAGNOSIS — F429 Obsessive-compulsive disorder, unspecified: Secondary | ICD-10-CM | POA: Diagnosis not present

## 2019-11-06 DIAGNOSIS — Y92129 Unspecified place in nursing home as the place of occurrence of the external cause: Secondary | ICD-10-CM | POA: Diagnosis not present

## 2019-11-09 DIAGNOSIS — Z1159 Encounter for screening for other viral diseases: Secondary | ICD-10-CM | POA: Diagnosis not present

## 2019-11-15 DIAGNOSIS — Z1159 Encounter for screening for other viral diseases: Secondary | ICD-10-CM | POA: Diagnosis not present

## 2019-11-21 DIAGNOSIS — Z1159 Encounter for screening for other viral diseases: Secondary | ICD-10-CM | POA: Diagnosis not present

## 2019-12-13 DIAGNOSIS — I10 Essential (primary) hypertension: Secondary | ICD-10-CM | POA: Diagnosis not present

## 2019-12-13 DIAGNOSIS — E039 Hypothyroidism, unspecified: Secondary | ICD-10-CM | POA: Diagnosis not present

## 2019-12-13 DIAGNOSIS — Z1159 Encounter for screening for other viral diseases: Secondary | ICD-10-CM | POA: Diagnosis not present

## 2019-12-25 DIAGNOSIS — M25522 Pain in left elbow: Secondary | ICD-10-CM | POA: Diagnosis not present

## 2019-12-25 DIAGNOSIS — M25512 Pain in left shoulder: Secondary | ICD-10-CM | POA: Diagnosis not present

## 2020-01-01 DIAGNOSIS — S42002A Fracture of unspecified part of left clavicle, initial encounter for closed fracture: Secondary | ICD-10-CM | POA: Diagnosis not present

## 2020-01-01 DIAGNOSIS — F3340 Major depressive disorder, recurrent, in remission, unspecified: Secondary | ICD-10-CM | POA: Diagnosis not present

## 2020-01-01 DIAGNOSIS — F429 Obsessive-compulsive disorder, unspecified: Secondary | ICD-10-CM | POA: Diagnosis not present

## 2020-01-02 DIAGNOSIS — Z880 Allergy status to penicillin: Secondary | ICD-10-CM | POA: Diagnosis not present

## 2020-01-02 DIAGNOSIS — R519 Headache, unspecified: Secondary | ICD-10-CM | POA: Diagnosis not present

## 2020-01-02 DIAGNOSIS — W19XXXA Unspecified fall, initial encounter: Secondary | ICD-10-CM | POA: Diagnosis not present

## 2020-01-02 DIAGNOSIS — F419 Anxiety disorder, unspecified: Secondary | ICD-10-CM | POA: Diagnosis not present

## 2020-01-02 DIAGNOSIS — R279 Unspecified lack of coordination: Secondary | ICD-10-CM | POA: Diagnosis not present

## 2020-01-02 DIAGNOSIS — S0101XA Laceration without foreign body of scalp, initial encounter: Secondary | ICD-10-CM | POA: Diagnosis not present

## 2020-01-02 DIAGNOSIS — Z743 Need for continuous supervision: Secondary | ICD-10-CM | POA: Diagnosis not present

## 2020-01-02 DIAGNOSIS — G44309 Post-traumatic headache, unspecified, not intractable: Secondary | ICD-10-CM | POA: Diagnosis not present

## 2020-01-02 DIAGNOSIS — Z79899 Other long term (current) drug therapy: Secondary | ICD-10-CM | POA: Diagnosis not present

## 2020-01-02 DIAGNOSIS — I10 Essential (primary) hypertension: Secondary | ICD-10-CM | POA: Diagnosis not present

## 2020-01-02 DIAGNOSIS — G44319 Acute post-traumatic headache, not intractable: Secondary | ICD-10-CM | POA: Diagnosis not present

## 2020-01-02 DIAGNOSIS — W06XXXA Fall from bed, initial encounter: Secondary | ICD-10-CM | POA: Diagnosis not present

## 2020-01-02 DIAGNOSIS — F329 Major depressive disorder, single episode, unspecified: Secondary | ICD-10-CM | POA: Diagnosis not present

## 2020-01-02 DIAGNOSIS — E039 Hypothyroidism, unspecified: Secondary | ICD-10-CM | POA: Diagnosis not present

## 2020-01-02 DIAGNOSIS — Z888 Allergy status to other drugs, medicaments and biological substances status: Secondary | ICD-10-CM | POA: Diagnosis not present

## 2020-01-02 DIAGNOSIS — Z886 Allergy status to analgesic agent status: Secondary | ICD-10-CM | POA: Diagnosis not present

## 2020-01-08 DIAGNOSIS — R2681 Unsteadiness on feet: Secondary | ICD-10-CM | POA: Diagnosis not present

## 2020-01-08 DIAGNOSIS — W19XXXA Unspecified fall, initial encounter: Secondary | ICD-10-CM | POA: Diagnosis not present

## 2020-01-08 DIAGNOSIS — S0101XA Laceration without foreign body of scalp, initial encounter: Secondary | ICD-10-CM | POA: Diagnosis not present

## 2020-01-08 DIAGNOSIS — S42002A Fracture of unspecified part of left clavicle, initial encounter for closed fracture: Secondary | ICD-10-CM | POA: Diagnosis not present

## 2020-01-10 DIAGNOSIS — E039 Hypothyroidism, unspecified: Secondary | ICD-10-CM | POA: Diagnosis not present

## 2020-01-10 DIAGNOSIS — D510 Vitamin B12 deficiency anemia due to intrinsic factor deficiency: Secondary | ICD-10-CM | POA: Diagnosis not present

## 2020-04-02 DEATH — deceased

## 2020-04-15 IMAGING — CR DG HIP (WITH OR WITHOUT PELVIS) 2-3V*R*
4 series · 4 of 4 positions shown · non-contrast
Comparison: None.

CLINICAL DATA: Fall with hip pain

EXAM:
DG HIP (WITH OR WITHOUT PELVIS) 2-3V RIGHT

[x pelvis]
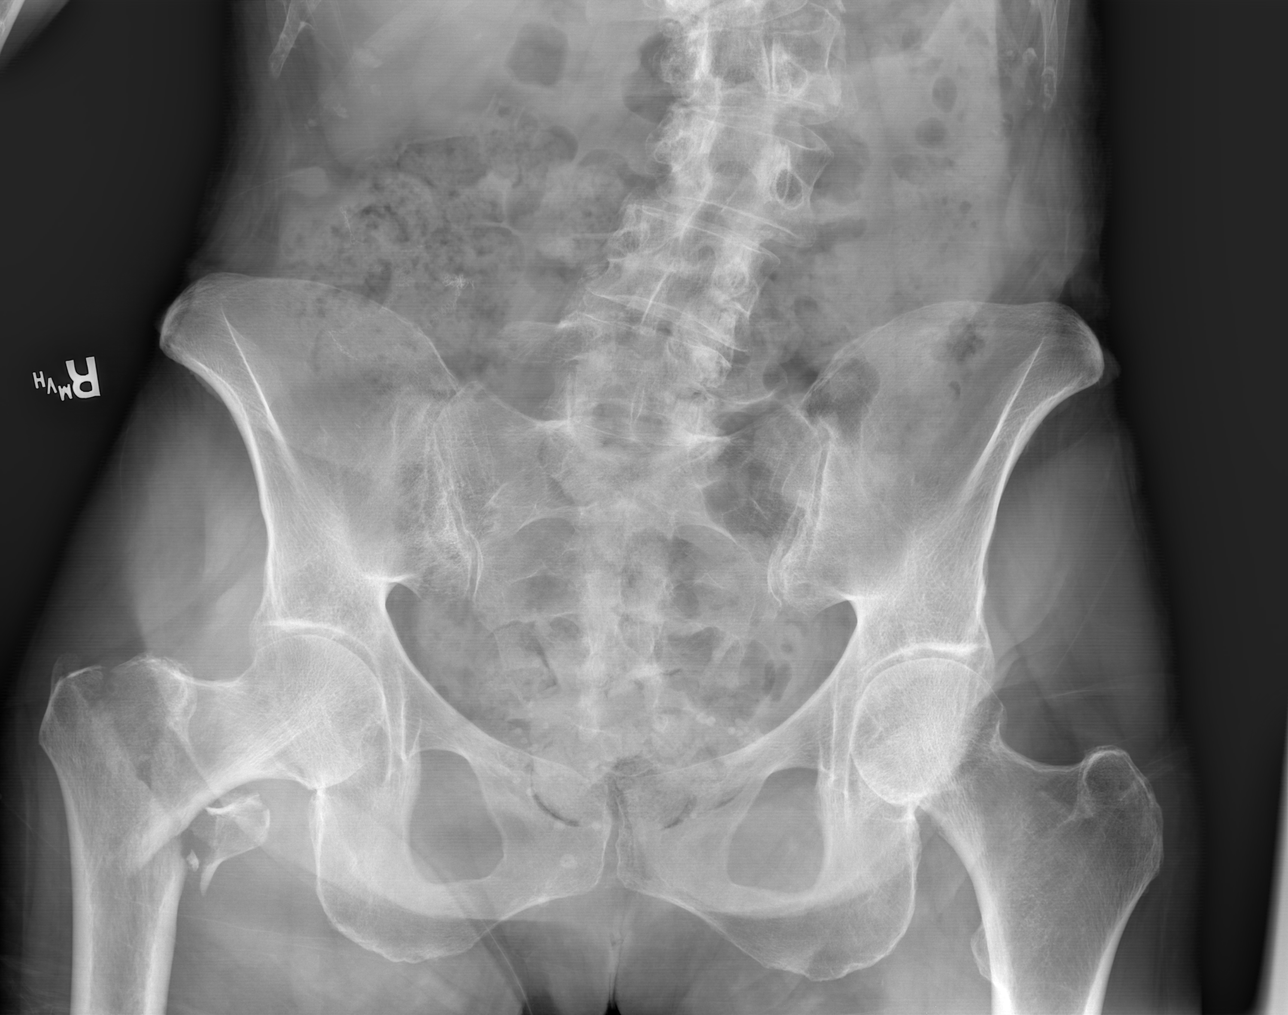

[x hip ap right]
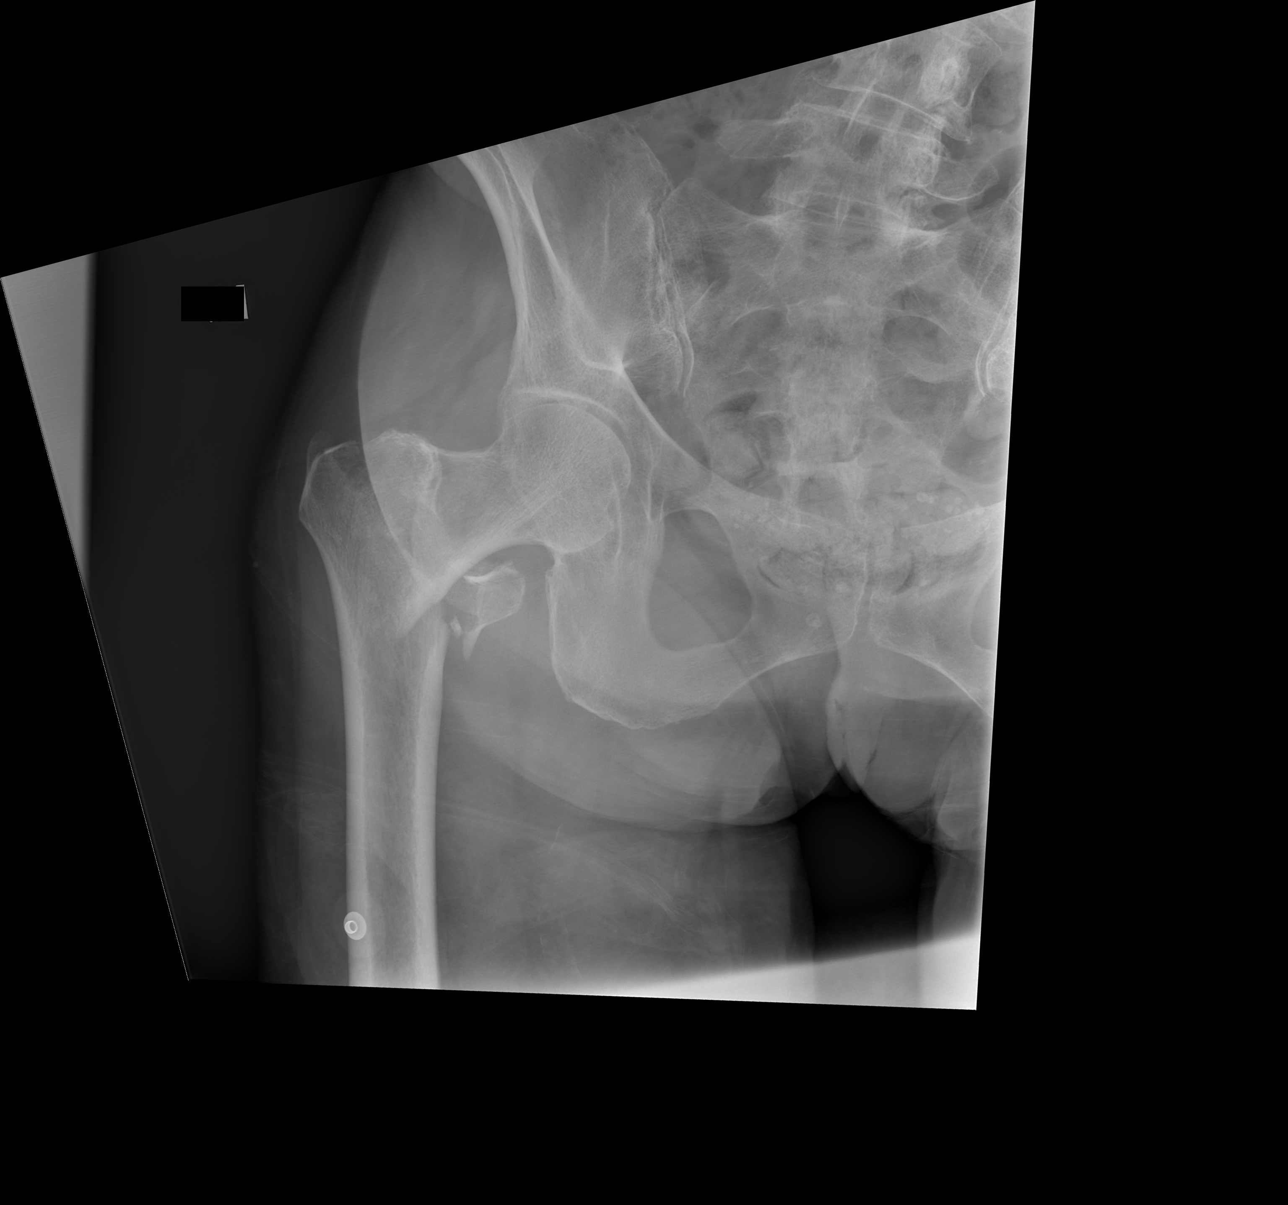

[w hip lat right (1 of 2)]
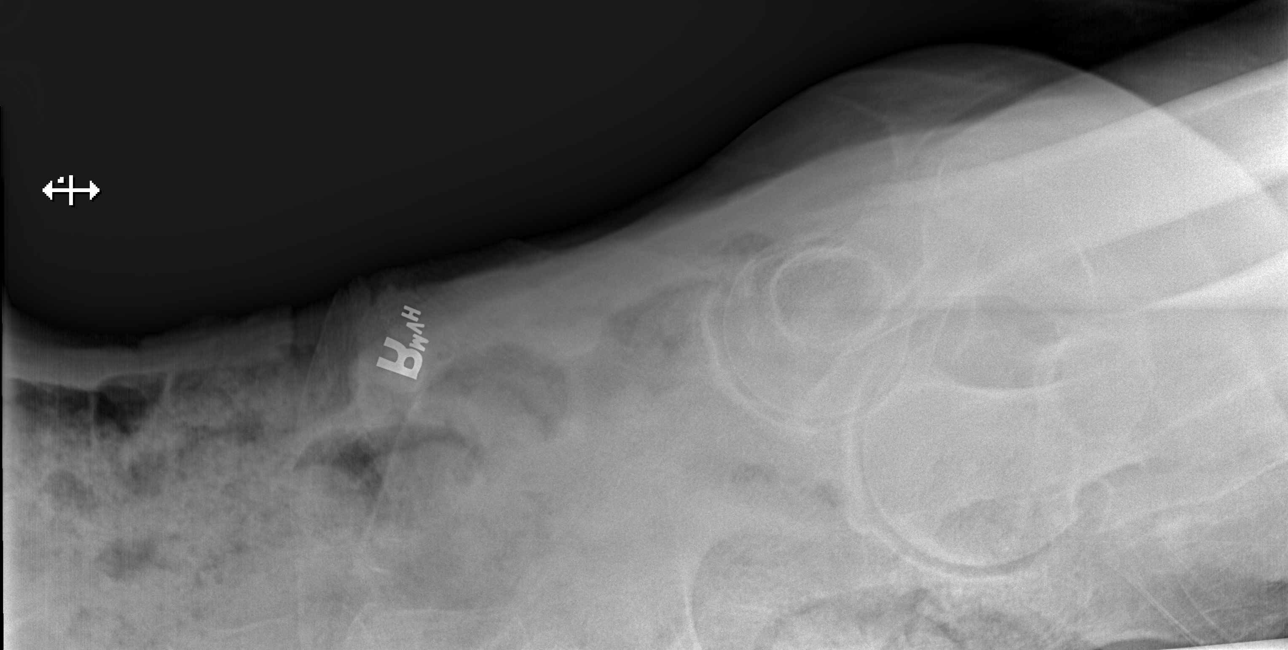

[w hip lat right (2 of 2)]
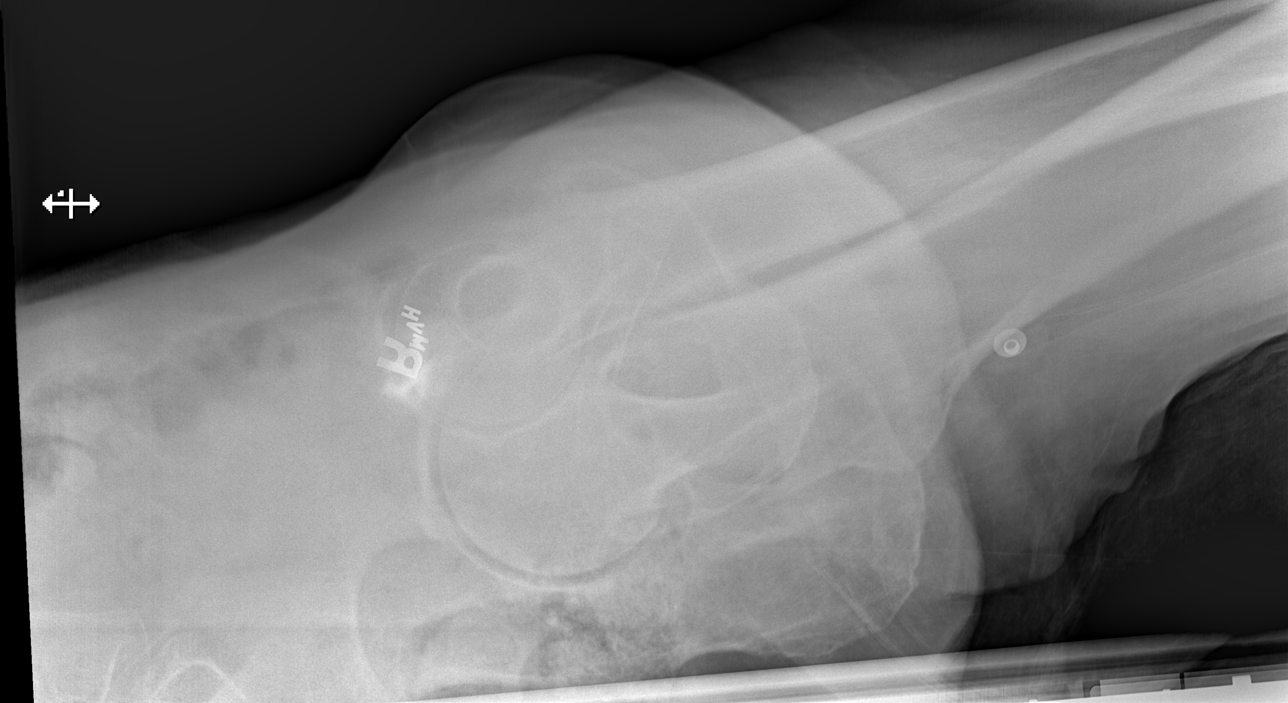

[4 of 4 positions shown; findings below may reference images not displayed]

FINDINGS: SI joints are non widened.  Pubic symphysis and rami are intact.

Acute comminuted intertrochanteric fracture with medially displaced
lesser trochanteric fracture fragment and mild cephalad displacement
of the distal femur. Right femoral head projects in joint.
IMPRESSION: Acute comminuted and displaced right femoral intertrochanteric
fracture

## 2020-04-15 IMAGING — CR DG CHEST 2V
2 series · 2 of 2 positions shown · non-contrast
Comparison: 11/21/2015, 11/17/2015

CLINICAL DATA: Mechanical fall

EXAM:
CHEST - 2 VIEW

[x chest ap]
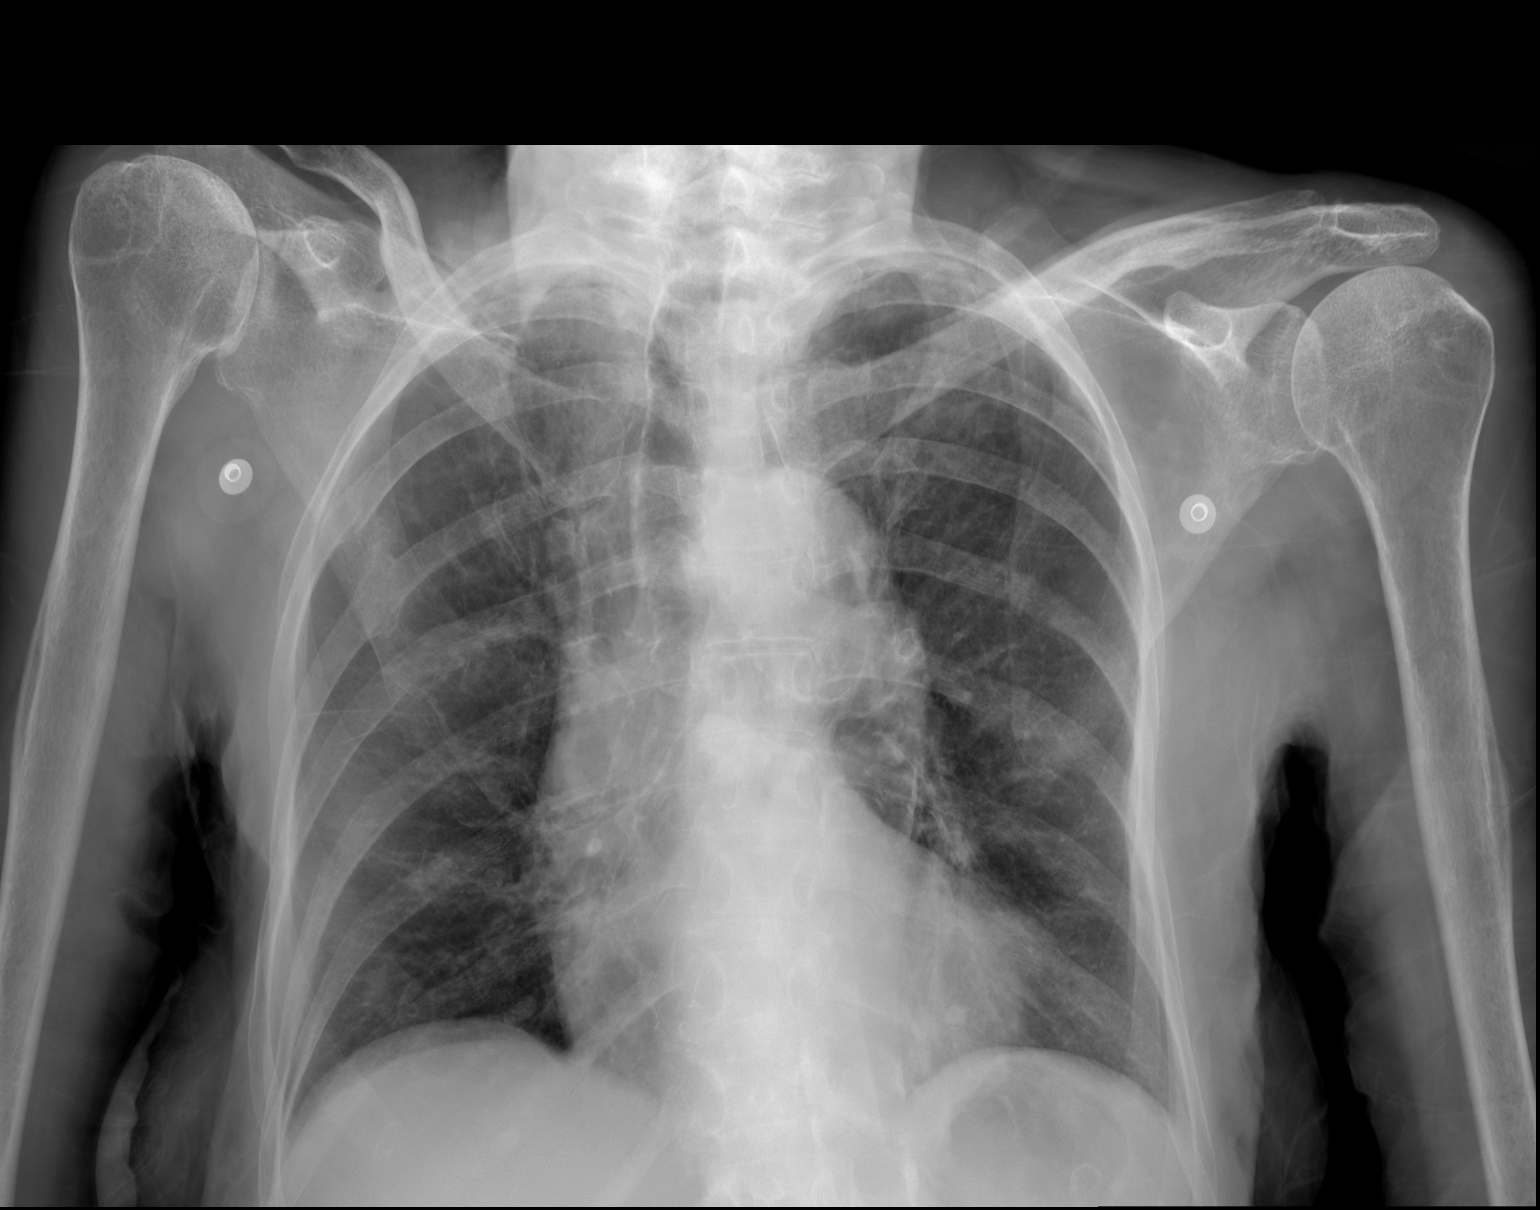

[w chest lat]
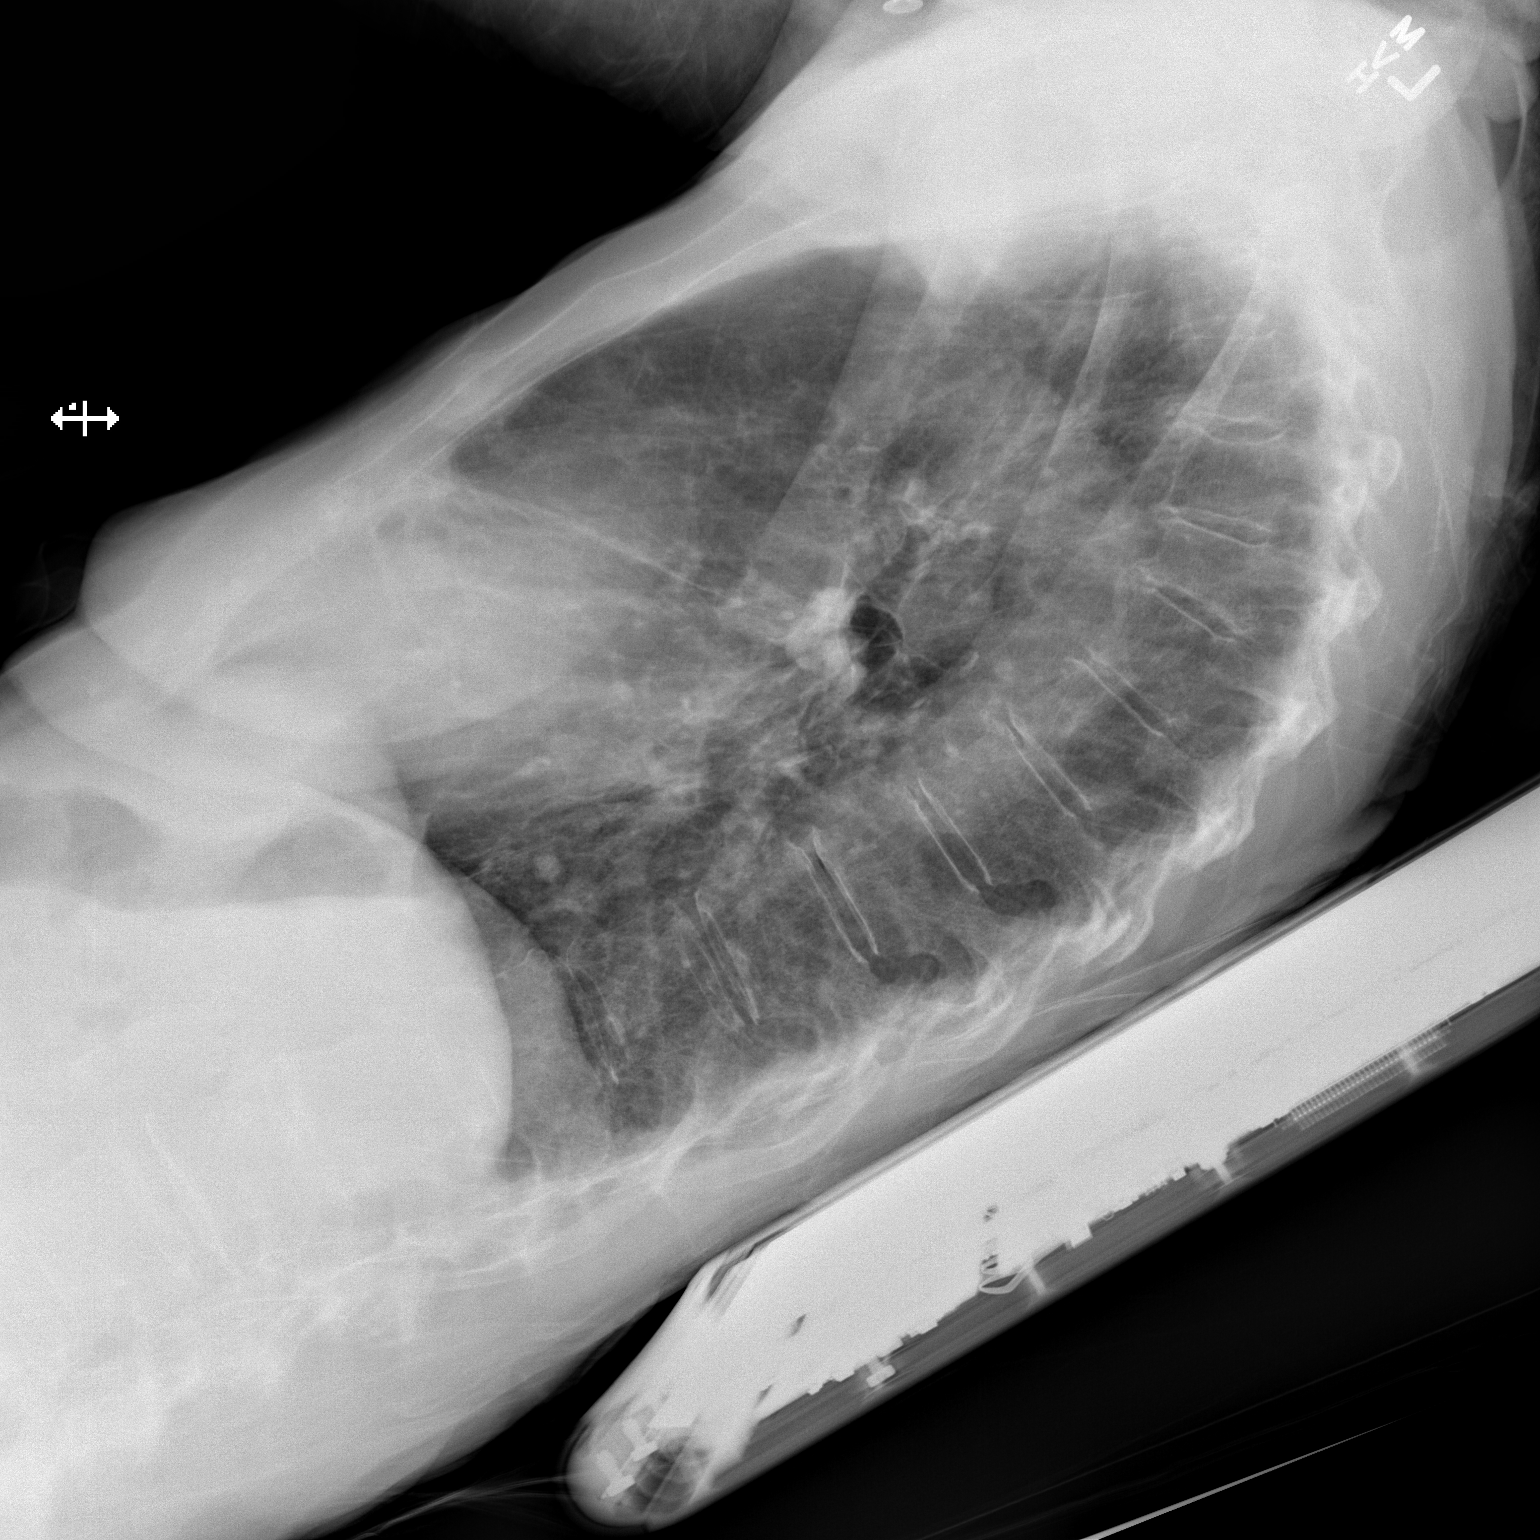

[2 of 2 positions shown; findings below may reference images not displayed]

FINDINGS: No large pleural effusion. No acute airspace disease or
pneumothorax. Stable cardiomediastinal silhouette with mildly
tortuous aorta.

Old right fourth fifth and sixth rib fractures. Age indeterminate
mild compression deformities of upper thoracic vertebra.
IMPRESSION: 1. Negative for pleural effusion or pneumothorax
2. Age indeterminate mild compression deformities of 2 adjacent
upper thoracic vertebra
